# Patient Record
Sex: Male | Born: 1990 | Race: White | Hispanic: No | Marital: Single | Smoking: Never smoker
Health system: Southern US, Community
[De-identification: ages and names within clinical notes are randomized; demographics above are authoritative.]

## PROBLEM LIST (undated history)

## (undated) DIAGNOSIS — F329 Major depressive disorder, single episode, unspecified: Secondary | ICD-10-CM

## (undated) DIAGNOSIS — E785 Hyperlipidemia, unspecified: Secondary | ICD-10-CM

## (undated) DIAGNOSIS — G2581 Restless legs syndrome: Secondary | ICD-10-CM

## (undated) DIAGNOSIS — E669 Obesity, unspecified: Secondary | ICD-10-CM

## (undated) DIAGNOSIS — T39311A Poisoning by propionic acid derivatives, accidental (unintentional), initial encounter: Secondary | ICD-10-CM

## (undated) DIAGNOSIS — E039 Hypothyroidism, unspecified: Secondary | ICD-10-CM

## (undated) DIAGNOSIS — L83 Acanthosis nigricans: Secondary | ICD-10-CM

## (undated) HISTORY — DX: Major depressive disorder, single episode, unspecified: F32.9

## (undated) HISTORY — DX: Hyperlipidemia, unspecified: E78.5

## (undated) HISTORY — DX: Poisoning by propionic acid derivatives, accidental (unintentional), initial encounter: T39.311A

## (undated) HISTORY — DX: Acanthosis nigricans: L83

## (undated) HISTORY — DX: Restless legs syndrome: G25.81

## (undated) HISTORY — DX: Obesity, unspecified: E66.9

---
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
## 2013-07-02 DIAGNOSIS — L83 Acanthosis nigricans: Secondary | ICD-10-CM | POA: Insufficient documentation

## 2013-08-11 DIAGNOSIS — G2581 Restless legs syndrome: Secondary | ICD-10-CM | POA: Insufficient documentation

## 2013-08-12 DIAGNOSIS — E785 Hyperlipidemia, unspecified: Secondary | ICD-10-CM | POA: Insufficient documentation

## 2013-12-31 DIAGNOSIS — F329 Major depressive disorder, single episode, unspecified: Secondary | ICD-10-CM | POA: Insufficient documentation

## 2013-12-31 DIAGNOSIS — F32A Depression, unspecified: Secondary | ICD-10-CM | POA: Insufficient documentation

## 2014-01-09 ENCOUNTER — Inpatient Hospital Stay: Payer: Self-pay | Admitting: Psychiatry

## 2014-01-09 LAB — CBC WITH DIFFERENTIAL/PLATELET
BASOS ABS: 0.2 10*3/uL — AB (ref 0.0–0.1)
Basophil %: 2.2 %
EOS PCT: 5.8 %
Eosinophil #: 0.5 10*3/uL (ref 0.0–0.7)
HCT: 38 % — ABNORMAL LOW (ref 40.0–52.0)
HGB: 12.8 g/dL — ABNORMAL LOW (ref 13.0–18.0)
Lymphocyte #: 2.2 10*3/uL (ref 1.0–3.6)
Lymphocyte %: 24.6 %
MCH: 31.3 pg (ref 26.0–34.0)
MCHC: 33.6 g/dL (ref 32.0–36.0)
MCV: 93 fL (ref 80–100)
MONOS PCT: 6.5 %
Monocyte #: 0.6 x10 3/mm (ref 0.2–1.0)
NEUTROS ABS: 5.4 10*3/uL (ref 1.4–6.5)
NEUTROS PCT: 60.9 %
Platelet: 236 10*3/uL (ref 150–440)
RBC: 4.07 10*6/uL — ABNORMAL LOW (ref 4.40–5.90)
RDW: 15.2 % — AB (ref 11.5–14.5)
WBC: 8.8 10*3/uL (ref 3.8–10.6)

## 2014-01-09 LAB — COMPREHENSIVE METABOLIC PANEL
ALBUMIN: 4.4 g/dL (ref 3.4–5.0)
ALT: 58 U/L
Alkaline Phosphatase: 62 U/L
Anion Gap: 7 (ref 7–16)
BUN: 15 mg/dL (ref 7–18)
Bilirubin,Total: 0.6 mg/dL (ref 0.2–1.0)
CO2: 28 mmol/L (ref 21–32)
Calcium, Total: 9.1 mg/dL (ref 8.5–10.1)
Chloride: 106 mmol/L (ref 98–107)
Creatinine: 1.34 mg/dL — ABNORMAL HIGH (ref 0.60–1.30)
EGFR (African American): >60
Glucose: 74 mg/dL (ref 65–99)
Osmolality: 281 (ref 275–301)
Potassium: 4.1 mmol/L (ref 3.5–5.1)
SGOT(AST): 45 U/L — ABNORMAL HIGH (ref 15–37)
SODIUM: 141 mmol/L (ref 136–145)
TOTAL PROTEIN: 8.2 g/dL (ref 6.4–8.2)

## 2014-01-09 LAB — TSH: Thyroid Stimulating Horm: >100 u[IU]/mL — ABNORMAL HIGH

## 2014-01-10 LAB — DRUG SCREEN, URINE
Amphetamines, Ur Screen: NEGATIVE (ref ?–1000)
Barbiturates, Ur Screen: NEGATIVE (ref ?–200)
Benzodiazepine, Ur Scrn: NEGATIVE (ref ?–200)
CANNABINOID 50 NG, UR ~~LOC~~: NEGATIVE (ref ?–50)
Cocaine Metabolite,Ur ~~LOC~~: NEGATIVE (ref ?–300)
MDMA (Ecstasy)Ur Screen: POSITIVE (ref ?–500)
METHADONE, UR SCREEN: NEGATIVE (ref ?–300)
Opiate, Ur Screen: NEGATIVE (ref ?–300)
PHENCYCLIDINE (PCP) UR S: NEGATIVE (ref ?–25)
Tricyclic, Ur Screen: NEGATIVE (ref ?–1000)

## 2014-01-10 LAB — HEMOGLOBIN A1C: HEMOGLOBIN A1C: 5.4 % (ref 4.2–6.3)

## 2014-01-10 LAB — URINALYSIS, COMPLETE
BLOOD: NEGATIVE
Bacteria: NONE SEEN
Bilirubin,UR: NEGATIVE
GLUCOSE, UR: NEGATIVE mg/dL (ref 0–75)
KETONE: NEGATIVE
Leukocyte Esterase: NEGATIVE
NITRITE: NEGATIVE
PH: 6 (ref 4.5–8.0)
Protein: NEGATIVE
RBC,UR: <1 /HPF (ref 0–5)
SPECIFIC GRAVITY: 1.021 (ref 1.003–1.030)
Squamous Epithelial: <1

## 2014-01-10 LAB — LIPID PANEL
CHOLESTEROL: 225 mg/dL — AB (ref 0–200)
HDL: 44 mg/dL (ref 40–60)
LDL CHOLESTEROL, CALC: 151 mg/dL — AB (ref 0–100)
TRIGLYCERIDES: 152 mg/dL (ref 0–200)
VLDL Cholesterol, Calc: 30 mg/dL (ref 5–40)

## 2014-01-11 LAB — T4, FREE: FREE THYROXINE: 0.26 ng/dL — AB (ref 0.76–1.46)

## 2014-04-02 DIAGNOSIS — E039 Hypothyroidism, unspecified: Secondary | ICD-10-CM

## 2014-04-29 ENCOUNTER — Ambulatory Visit
Admit: 2014-04-29 | Disposition: A | Discharge status: Home / Self Care | Payer: Self-pay | Attending: Family Medicine | Admitting: Family Medicine

## 2014-05-23 NOTE — H&P (Signed)
PATIENT NAME:  Joe Haley, Joe Haley MR#:  [id number] DATE OF BIRTH:  Sep 27, 1990  DATE OF ADMISSION:  01/09/2014  INITIAL PSYCHIATRY ASSESSMENT   IDENTIFYING INFORMATION: The patient is a 24 year old single Caucasian male from [place], [place], is currently works at [employer].  CHIEF COMPLAINT: "I've been really down."  HISTORY OF PRESENT ILLNESS: This patient was referred for direct admission by [facility]. They have reported that the patient presented there for a crisis evaluation and was reporting having suicidal ideations with thoughts of overdosing. The patient was interviewed today. He reports being depressed for about 2 years; however, over the last 3 weeks, the depression has significantly worsened. He describes his stressors, worries with his weight, as he is overweight. He also is unhappy with his work. He worked as a Tax adviser at [employer] and has been having trouble with his supervisors. The patient feels that he can't keep up with the job and is stressful to him. The patient also states that he lives at home with his parents and his 2 siblings. His sister, who is 67 years old, has a child who is 17 years old, and the patient states that he is upset because his parents are functioning as the main caregivers of the 62-year-old and there is a lot of fighting between them and the sister.  In terms of his symptomatology, the patient complains of depressed mood, poor energy, poor concentration. He denies issues with appetite or sleep. He does feel unmotivated to do things. He does not have many hobbies, has not been doing many things for pleasure. He states that he is usually too shy and is self-conscious about his appearance and the way he talks to people. He only has friends that he has met online whom he plays video games with. Most of his friends are from [place]. He has never actually met them. The patient denies any homicidality or having auditory or visual hallucinations.  In  terms of substance abuse, he denies abusing any alcohol or illicit substances.  In terms of trauma, he denies any history of traumatic events in his life.   PAST PSYCHIATRIC HISTORY: The patient denies any prior psychiatric history. Never been hospitalized. Does not have any history of suicidal attempts or self-injurious behaviors. He has been seeing his primary care provider, Eulogio Bear, from [place], who has been prescribing him with bupropion 150 mg p.o. He has been taking this medication for about 2 weeks and he feels is helping some.   PAST MEDICAL HISTORY: Noncontributory for any medical problems. No history of seizures or head trauma.   FAMILY HISTORY: Noncontributory. The patient denies any family history of mental illness, suicide, drug addiction or alcohol use.   SOCIAL HISTORY: The patient has a twelfth grade education. He completed a semester at the [employer] and states he did well. He, however, did not continue, as he does not have the financial means to pay for the following semester. He has been working at [employer] and is trying to save money in order to return in the spring. The patient wants hopes to become a radiology technician as his father. 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 The patient is single. He has never been married. Does not have any children. He denies any history of legal problems. He lives with his parents and his  2 siblings, brother who is 70, a sister who is 54, and her sister's son who is 64 years old. The patient denies any history of academic difficulties in school. It appears that he has a history of being an introvert with some issues with socialization.   ALLERGIES: NO KNOWN DRUG ALLERGIES.  REVIEW OF SYSTEMS: The patient denies any nausea, vomiting, or diarrhea. The rest of the 10 review of systems  is negative.   MENTAL STATUS EXAMINATION: The patient is an obese 24 year old Caucasian male who appears his stated age. He displays fair grooming and hygiene. He was passive, but cooperative with the assessment. His psychomotor activity is retarded. His eye contact is limited. His speech had decreased tone, volume, and rate. Thought process is concrete. Thought content is negative for suicidality, negative for homicidality, negative for auditory or visual hallucinations. His mood is dysphoric. His affect is blunted. Insight and judgment are fair. Cognitive examination: He is alert and oriented in person, place, time, and situation. Fund of knowledge appears to be below average for his level of education.   PHYSICAL EXAMINATION: VITAL SIGNS: Blood pressure is 123/79, respirations 20, pulse 79, temperature 98.9.  MUSCULOSKELETAL: The patient has a normal gait, normal muscular tone. No evidence of involuntary movements.   LABORATORY RESULTS: The patient has normal limits comprehensive metabolic panel. He only shows mild elevation of AST at 45. He has mildly decreased hemoglobin and hematocrit 12.8 and 38.0. Urine is pending.   DIAGNOSIS: Major depressive disorder, single episode, severe. Rule out social phobia. Obesity.   ASSESSMENT AND PLAN: The patient is a 24 year old admitted due to worsening depression and suicidality. The mental status examination is consistent with major depressive disorder of significant severity. The patient was tearful. Had poor eye contact. Had difficulties sustaining his attention. I do agreed that the patient, at this point in time, will benefit from inpatient psychiatric hospitalization in order to stabilize him.   PLAN: The patient, for depression, will be continued on Wellbutrin XL; however, I will increase the dose to 300 mg p.o. daily. This medication was chosen, as it is an activating agent and has low propensity for increasing weight. For insomnia, the patient will be  started on Ambien.  LABORATORY: I will order a B12, a TSH, hemoglobin A1c, and lipid panel. As this patient has significant obesity, metabolic syndrome needs to be ruled out. Also the TSH and B12 will be checked in order to rule out organic cause for his depressive symptomatology.   DISCHARGE DISPOSITION: Once this patient is stable, he will return back to his parent's house in [place]. Social worker will try to schedule with a psychiatrist in the [place] system, as he has [employer] and has already a primary care provider in that area. If not, the patient could also be referred to come here to [facility].    ____________________________ Hildred Priest, MD ahg:sw D: 01/09/2014 17:12:59 ET T: 01/09/2014 17:30:20 ET JOB#: [id number]  cc: Hildred Priest, MD, <Dictator> Rhodia Albright MD ELECTRONICALLY SIGNED 01/13/2014 21:43

## 2014-05-27 NOTE — H&P (Signed)
PATIENT NAME:  Joe Haley, Joe Haley MR#:  [id number] DATE OF BIRTH:  1990/03/23  DATE OF CONSULTATION:  01/10/2014  PRIMARY CARE PROVIDER: Courtney ParisElizabeth B. White, FNP  CHIEF COMPLAINT: In the hospital for depression. Consult requested for elevated TSH.   HISTORY OF PRESENT ILLNESS: This is a 24 year old man who is admitted to the psychiatric unit for depression on 01/09/2014. On routine testing, he was found to have a TSH of a greater than 100. The patient complains of feeling fatigued for the past 3 weeks to a month. He has been depressed and that depression has been going on for a while, and has been in the past also. No complaints of constipation or weight changes. The patient does not offer any further physical complaints. Hospitalist services were contacted for further evaluation.   PAST MEDICAL HISTORY: Depression.   PAST SURGICAL HISTORY: None.   ALLERGIES: No known drug allergies.   HOME MEDICATIONS: Prior to coming into the hospital, included: Wellbutrin 150 mg daily extended-release. Here in the hospital he is taking: Tylenol as needed for pain, Mylanta as needed for heartburn, Wellbutrin was increased to 300 mg extended-release daily, Ambien 5 mg as needed for sleep.   SOCIAL HISTORY: No smoking. No alcohol. No drug use. 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   FAMILY HISTORY: Parents are healthy, without thyroid issues. Mother has a foot issue. No thyroid issues in the family.   REVIEW OF SYSTEMS:  CONSTITUTIONAL: Positive for fatigue. No fever, chills, or sweats. No weight loss. No weight gain.  EYES: No visual problems.  EARS, NOSE, MOUTH AND THROAT: No sore throat. No difficulty swallowing. No hearing problems.  CARDIOVASCULAR: No chest pain. No palpitations.  RESPIRATORY: No shortness of breath. Occasional cough with clear to green phlegm. No hemoptysis.  GASTROINTESTINAL: No nausea. No vomiting. No abdominal pain. No diarrhea. No constipation. Occasional blood in the bowel  movements with hemorrhoids.  GENITOURINARY: No burning on urination or hematuria.  MUSCULOSKELETAL: No joint pain or muscle pain.  INTEGUMENT: No rashes or eruptions.  NEUROLOGIC: No fainting or blackouts.  PSYCHIATRIC: Positive for depression.  ENDOCRINE: No prior thyroid history.  HEMATOLOGIC AND LYMPHATIC: No anemia. No easy bruising or bleeding.   PHYSICAL EXAMINATION:  VITAL SIGNS: Temperature 97.9, pulse 63, respirations 20, blood pressure 121/80, pulse oximetry 97% on room air.  GENERAL: Obese male in no respiratory distress.  EYES: Conjunctivae and lids normal. Pupils equal, round, and reactive to light. Extraocular muscles intact. No nystagmus. Slight exophthalmos. EARS, NOSE, MOUTH AND THROAT: Nasal mucosa: No erythema.  Throat: No erythema, no exudate seen. Lips and Gums: No lesions.  NECK: No JVD. No bruits. No lymphadenopathy. No thyromegaly. No thyroid nodules palpated.  RESPIRATORY: Lungs clear to auscultation. No use of accessory muscles to breathe. No rhonchi, rales, or wheeze heard.  CARDIOVASCULAR: S1 and S2 normal. No gallops, rubs, or murmurs heard. Carotid upstroke 2+ bilaterally. No bruits.  EXTREMITIES: Dorsalis pedis pulses 2+ bilaterally. Trace edema of over the shins.  ABDOMEN: Soft, nontender. No organomegaly/splenomegaly. Normoactive bowel sounds. No masses felt.  LYMPHATIC: No lymph nodes in the neck.  MUSCULOSKELETAL: Trace edema over the shins. No clubbing. No cyanosis.  SKIN: Acanthosis nigrans seen over the neck posteriorly, and bilateral sides of the neck. No other lesions seen.  NEUROLOGICAL: Cranial nerves II through XII grossly intact. Deep tendon reflexes 2+ bilateral lower extremities.  PSYCHIATRIC: The patient is oriented to person, place, and time.   LABORATORY AND RADIOLOGICAL DATA: TSH greater than 100. Glucose  74, BUN 15, creatinine 1.34, sodium 141, potassium 4.1, chloride 106, CO2 of 28, calcium 9.1. Liver function tests: AST slightly elevated  at 45. White blood cell count 8.8, H and H 12.8 and 38.0, platelet count 236,000. Hemoglobin A1c 5.4. LDL 151, HDL 44, triglycerides 152, total cholesterol 225. Urine toxicology positive for MDMA. Urinalysis is negative.   ASSESSMENT AND PLAN:  1.  Hypothyroidism. No family history of hypothyroidism. TSH greater than 100. I will check free T4, free T3, thyroid peroxidase antibody and thyroglobulin. I will start levothyroxine 137 mcg p.o. daily and can potentially start up to 1.6 mcg/kg daily. I recommend checking TFTs in 6 weeks if much improved, but can be done as early as 3 weeks if still symptomatic.  2.  Depression. This could be secondary to hypothyroidism.  3.  Morbid obesity with a body mass index of 42.3; weight loss is needed.  4.  Acanthosis nigrans seen on the neck. This is likely secondary to metabolic syndrome with obesity, high triglycerides, increased waist circumference, increased LDL. Weight loss is definitely needed for this.   TIME SPENT ON CONSULTATION: 50 minutes.    ____________________________ Herschell Dimes. Renae Gloss, MD rjw:MT D: 01/10/2014 14:43:17 ET T: 01/10/2014 15:21:44 ET JOB#: [id number]  cc: Herschell Dimes. Renae Gloss, MD, <Dictator> Courtney Paris. White, FNP Salley Scarlet MD ELECTRONICALLY SIGNED 01/11/2014 13:19

## 2014-05-31 NOTE — Consult Note (Signed)
PATIENT NAME:  Joe Haley, Joe Haley MR#:  [id number] DATE OF BIRTH:  08/20/1990  DATE OF CONSULTATION:  01/10/2014  CONSULTING PHYSICIAN:  Herschell Dimes. Renae Gloss, MD  PRIMARY CARE PROVIDER: Courtney Paris. White, FNP  CHIEF COMPLAINT: In the hospital for depression. Consult requested for elevated TSH.   HISTORY OF PRESENT ILLNESS: This is a 24 year old man who is admitted to the psychiatric unit for depression on 01/09/2014. On routine testing, he was found to have a TSH of a greater than 100. The patient complains of feeling fatigued for the past 3 weeks to a month. He has been depressed and that depression has been going on for a while, and has been in the past also. No complaints of constipation or weight changes. The patient does not offer any further physical complaints. Hospitalist services were contacted for further evaluation.   PAST MEDICAL HISTORY: Depression.   PAST SURGICAL HISTORY: None.   ALLERGIES: No known drug allergies.   HOME MEDICATIONS: Prior to coming into the hospital, included: Wellbutrin 150 mg daily extended-release. Here in the hospital he is taking: Tylenol as needed for pain, Mylanta as needed for heartburn, Wellbutrin was increased to 300 mg extended-release daily, Ambien 5 mg as needed for sleep.   SOCIAL HISTORY: No smoking. No alcohol. No drug use. 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   FAMILY HISTORY: Parents are healthy, without thyroid issues. Mother has a foot issue. No thyroid issues in the family.   REVIEW OF SYSTEMS:  CONSTITUTIONAL: Positive for fatigue. No fever, chills, or sweats. No weight loss. No weight gain.  EYES: No visual problems.  EARS, NOSE, MOUTH AND THROAT: No sore throat. No difficulty swallowing. No hearing problems.  CARDIOVASCULAR: No chest pain. No palpitations.  RESPIRATORY: No shortness of breath. Occasional cough with clear to green phlegm. No hemoptysis.  GASTROINTESTINAL: No nausea. No vomiting. No abdominal pain. No diarrhea. No  constipation. Occasional blood in the bowel movements with hemorrhoids.  GENITOURINARY: No burning on urination or hematuria.  MUSCULOSKELETAL: No joint pain or muscle pain.  INTEGUMENT: No rashes or eruptions.  NEUROLOGIC: No fainting or blackouts.  PSYCHIATRIC: Positive for depression.  ENDOCRINE: No prior thyroid history.  HEMATOLOGIC AND LYMPHATIC: No anemia. No easy bruising or bleeding.   PHYSICAL EXAMINATION:  VITAL SIGNS: Temperature 97.9, pulse 63, respirations 20, blood pressure 121/80, pulse oximetry 97% on room air.  GENERAL: Obese male in no respiratory distress.  EYES: Conjunctivae and lids normal. Pupils equal, round, and reactive to light. Extraocular muscles intact. No nystagmus. Slight exophthalmos. EARS, NOSE, MOUTH AND THROAT: Nasal mucosa: No erythema.  Throat: No erythema, no exudate seen. Lips and Gums: No lesions.  NECK: No JVD. No bruits. No lymphadenopathy. No thyromegaly. No thyroid nodules palpated.  RESPIRATORY: Lungs clear to auscultation. No use of accessory muscles to breathe. No rhonchi, rales, or wheeze heard.  CARDIOVASCULAR: S1 and S2 normal. No gallops, rubs, or murmurs heard. Carotid upstroke 2+ bilaterally. No bruits.  EXTREMITIES: Dorsalis pedis pulses 2+ bilaterally. Trace edema of over the shins.  ABDOMEN: Soft, nontender. No organomegaly/splenomegaly. Normoactive bowel sounds. No masses felt.  LYMPHATIC: No lymph nodes in the neck.  MUSCULOSKELETAL: Trace edema over the shins. No clubbing. No cyanosis.  SKIN: Acanthosis nigrans seen over the neck posteriorly, and bilateral sides of the neck. No other lesions seen.  NEUROLOGICAL: Cranial nerves II through XII grossly intact. Deep tendon reflexes 2+ bilateral lower extremities.  PSYCHIATRIC: The patient is oriented to person, place, and time.   LABORATORY  AND RADIOLOGICAL DATA: TSH greater than 100. Glucose 74, BUN 15, creatinine 1.34, sodium 141, potassium 4.1, chloride 106, CO2 of 28, calcium 9.1.  Liver function tests: AST slightly elevated at 45. White blood cell count 8.8, H and H 12.8 and 38.0, platelet count 236,000. Hemoglobin A1c 5.4. LDL 151, HDL 44, triglycerides 152, total cholesterol 225. Urine toxicology positive for MDMA. Urinalysis is negative.   ASSESSMENT AND PLAN:  1.  Hypothyroidism. No family history of hypothyroidism. TSH greater than 100. I will check free T4, free T3, thyroid peroxidase antibody and thyroglobulin. I will start levothyroxine 137 mcg p.o. daily and can potentially start up to 1.6 mcg/kg daily. I recommend checking TFTs in 6 weeks if much improved, but can be done as early as 3 weeks if still symptomatic.  2.  Depression. This could be secondary to hypothyroidism.  3.  Morbid obesity with a body mass index of 42.3; weight loss is needed.  4.  Acanthosis nigrans seen on the neck. This is likely secondary to metabolic syndrome with obesity, high triglycerides, increased waist circumference, increased LDL. Weight loss is definitely needed for this.   TIME SPENT ON CONSULTATION: 50 minutes.    ____________________________ Herschell Dimesichard J. Renae GlossWieting, MD rjw:MT D: 01/10/2014 14:43:17 ET T: 01/10/2014 15:21:44 ET JOB#: [id number]  cc: Herschell Dimesichard J. Renae GlossWieting, MD, <Dictator> Courtney ParisElizabeth B. White, FNP Salley ScarletICHARD J Daevon Holdren MD ELECTRONICALLY SIGNED 01/11/2014 13:19

## 2014-06-24 DIAGNOSIS — F411 Generalized anxiety disorder: Secondary | ICD-10-CM | POA: Insufficient documentation

## 2014-06-24 DIAGNOSIS — F331 Major depressive disorder, recurrent, moderate: Secondary | ICD-10-CM | POA: Insufficient documentation

## 2016-03-27 ENCOUNTER — Emergency Department
Admission: EM | Admit: 2016-03-27 | Discharge: 2016-03-28 | Disposition: A | Discharge status: Other Acute Inpatient Hospital | Payer: Managed Care, Other (non HMO) | Attending: Emergency Medicine | Admitting: Emergency Medicine

## 2016-03-27 DIAGNOSIS — E237 Disorder of pituitary gland, unspecified: Secondary | ICD-10-CM

## 2016-03-27 DIAGNOSIS — F3130 Bipolar disorder, current episode depressed, mild or moderate severity, unspecified: Secondary | ICD-10-CM | POA: Diagnosis not present

## 2016-03-27 DIAGNOSIS — R410 Disorientation, unspecified: Secondary | ICD-10-CM | POA: Insufficient documentation

## 2016-03-27 DIAGNOSIS — Z79899 Other long term (current) drug therapy: Secondary | ICD-10-CM | POA: Insufficient documentation

## 2016-03-27 DIAGNOSIS — T39311A Poisoning by propionic acid derivatives, accidental (unintentional), initial encounter: Secondary | ICD-10-CM

## 2016-03-27 DIAGNOSIS — E039 Hypothyroidism, unspecified: Secondary | ICD-10-CM

## 2016-03-27 DIAGNOSIS — T1491XA Suicide attempt, initial encounter: Secondary | ICD-10-CM

## 2016-03-27 DIAGNOSIS — F332 Major depressive disorder, recurrent severe without psychotic features: Secondary | ICD-10-CM

## 2016-03-27 DIAGNOSIS — T39312A Poisoning by propionic acid derivatives, intentional self-harm, initial encounter: Secondary | ICD-10-CM | POA: Diagnosis present

## 2016-03-27 LAB — ETHANOL

## 2016-03-27 LAB — URINALYSIS, COMPLETE (UACMP) WITH MICROSCOPIC
BILIRUBIN URINE: NEGATIVE
GLUCOSE, UA: NEGATIVE mg/dL
Hgb urine dipstick: NEGATIVE
Ketones, ur: NEGATIVE mg/dL
LEUKOCYTES UA: NEGATIVE
NITRITE: NEGATIVE
PH: 5 (ref 5.0–8.0)
Protein, ur: 30 mg/dL — AB
Specific Gravity, Urine: 1.016 (ref 1.005–1.030)

## 2016-03-27 LAB — URINE DRUG SCREEN, QUALITATIVE (ARMC ONLY)
AMPHETAMINES, UR SCREEN: NOT DETECTED
BENZODIAZEPINE, UR SCRN: NOT DETECTED
Barbiturates, Ur Screen: NOT DETECTED
Cannabinoid 50 Ng, Ur ~~LOC~~: NOT DETECTED
Cocaine Metabolite,Ur ~~LOC~~: NOT DETECTED
MDMA (ECSTASY) UR SCREEN: NOT DETECTED
METHADONE SCREEN, URINE: NOT DETECTED
Opiate, Ur Screen: NOT DETECTED
PHENCYCLIDINE (PCP) UR S: NOT DETECTED
Tricyclic, Ur Screen: NOT DETECTED

## 2016-03-27 LAB — COMPREHENSIVE METABOLIC PANEL
ALBUMIN: 4.7 g/dL (ref 3.5–5.0)
ALK PHOS: 49 U/L (ref 38–126)
ALT: 22 U/L (ref 17–63)
ALT: 19 U/L (ref 17–63)
ANION GAP: 12 (ref 5–15)
AST: 23 U/L (ref 15–41)
AST: 24 U/L (ref 15–41)
Albumin: 4 g/dL (ref 3.5–5.0)
Alkaline Phosphatase: 60 U/L (ref 38–126)
Anion gap: 11 (ref 5–15)
BILIRUBIN TOTAL: 0.5 mg/dL (ref 0.3–1.2)
BUN: 13 mg/dL (ref 6–20)
BUN: 12 mg/dL (ref 6–20)
CALCIUM: 8 mg/dL — AB (ref 8.9–10.3)
CHLORIDE: 107 mmol/L (ref 101–111)
CHLORIDE: 111 mmol/L (ref 101–111)
CO2: 22 mmol/L (ref 22–32)
CO2: 19 mmol/L — ABNORMAL LOW (ref 22–32)
CREATININE: 0.85 mg/dL (ref 0.61–1.24)
Calcium: 8.6 mg/dL — ABNORMAL LOW (ref 8.9–10.3)
Creatinine, Ser: 1.03 mg/dL (ref 0.61–1.24)
GFR calc Af Amer: >60 mL/min (ref >60)
GFR calc non Af Amer: >60 mL/min (ref >60)
GLUCOSE: 83 mg/dL (ref 65–99)
Glucose, Bld: 79 mg/dL (ref 65–99)
Potassium: 4.2 mmol/L (ref 3.5–5.1)
Potassium: 3.9 mmol/L (ref 3.5–5.1)
Sodium: 141 mmol/L (ref 135–145)
Sodium: 141 mmol/L (ref 135–145)
Total Bilirubin: 0.6 mg/dL (ref 0.3–1.2)
Total Protein: 7.6 g/dL (ref 6.5–8.1)
Total Protein: 6.5 g/dL (ref 6.5–8.1)

## 2016-03-27 LAB — CBC
HEMATOCRIT: 47.2 % (ref 40.0–52.0)
Hemoglobin: 16.1 g/dL (ref 13.0–18.0)
MCH: 30.1 pg (ref 26.0–34.0)
MCHC: 34.2 g/dL (ref 32.0–36.0)
MCV: 88 fL (ref 80.0–100.0)
PLATELETS: 262 10*3/uL (ref 150–440)
RBC: 5.36 MIL/uL (ref 4.40–5.90)
RDW: 14.7 % — ABNORMAL HIGH (ref 11.5–14.5)
WBC: 13.8 10*3/uL — AB (ref 3.8–10.6)

## 2016-03-27 LAB — LACTIC ACID, PLASMA: LACTIC ACID, VENOUS: 1.3 mmol/L (ref 0.5–1.9)

## 2016-03-27 LAB — ACETAMINOPHEN LEVEL

## 2016-03-27 LAB — SALICYLATE LEVEL: Salicylate Lvl: <7 mg/dL (ref 2.8–30.0)

## 2016-03-27 LAB — LIPASE, BLOOD: LIPASE: 28 U/L (ref 11–51)

## 2016-03-27 LAB — TROPONIN I: Troponin I: <0.03 ng/mL (ref <0.03)

## 2016-03-27 MED ORDER — SODIUM CHLORIDE 0.9 % IV SOLN
Freq: Once | INTRAVENOUS | Status: AC
  Administered 2016-03-27: 16:00:00 via INTRAVENOUS

## 2016-03-27 MED ORDER — ONDANSETRON HCL 4 MG/2ML IJ SOLN
4.0000 mg | Freq: Once | INTRAMUSCULAR | Status: AC
  Administered 2016-03-27: 4 mg via INTRAVENOUS
  Filled 2016-03-27: qty 2

## 2016-03-27 NOTE — ED Notes (Signed)
EDP at bedside  

## 2016-03-27 NOTE — ED Notes (Signed)
TTS at bedside. 

## 2016-03-27 NOTE — ED Notes (Signed)

## 2016-03-27 NOTE — ED Notes (Signed)
Pt resting in bed, resp even and unlabored, pt eating dinner tray

## 2016-03-27 NOTE — ED Notes (Signed)
Elnita MaxwellCheryl from [facility] called asking about patient status.  She was given current vitals and told that his recent CMP was WNL.

## 2016-03-27 NOTE — ED Notes (Signed)
Patient's father called for an update on this patient, he was updated with patient consent.  He will call back tomorrow for another update.

## 2016-03-27 NOTE — ED Notes (Signed)
Hourly rounding reveals patient sleeping in room. No complaints, stable, in no acute distress. Q15 minute rounds and monitoring via Security Cam[employer] to continue. 

## 2016-03-27 NOTE — ED Notes (Signed)
Dressed pt out , clothing placed in belonging bag, shoes, sweat pants, t-shirt, fleece jacket.     I phone given to pts father per pt request

## 2016-03-27 NOTE — ED Provider Notes (Signed)
[facility] Emergency Department Provider Note   ____________________________________________   First MD Initiated Contact with Patient 03/27/16 1548     (approximate)  I have reviewed the triage vital signs and the nursing notes.   HISTORY  Chief Complaint Suicide Attempt      HPI Joe Haley is a 26 y.o. male she tells me that at 3:00 this morning he took a big bottle of ibuprofen 200 mg tablets possibly 500 pills in the bottle. This was again at 3:00 in the morning this morning patient now feels a little bit nauseated but is otherwise okay patient is still depressed he wants it altered and he rather the dad he thinks he better off that way. He denies any other complaints is nothing else is hurting her bothering him.  History reviewed. No pertinent past medical history.  There are no active problems to display for this patient.   History reviewed. No pertinent surgical history.  Prior to Admission medications   Medication Sig Start Date End Date Taking? Authorizing Provider  levothyroxine (SYNTHROID, LEVOTHROID) 150 MCG tablet Take 150 mcg by mouth daily. 03/03/16  Yes Historical Provider, MD    Allergies Patient has no known allergies.  No family history on file.  Social History Social History  Substance Use Topics  . Smoking status: Never Smoker  . Smokeless tobacco: Never Used  . Alcohol use No    Review of Systems Constitutional: No fever/chills Eyes: No visual changes. ENT: No sore throat. Cardiovascular: Denies chest pain. Respiratory: Denies shortness of breath. Gastrointestinal: No abdominal pain.  No nausea, no vomiting.  No diarrhea.  No constipation. Genitourinary: Negative for dysuria. Musculoskeletal: Negative for back pain. Skin: Negative for rash.  10-point ROS otherwise negative.  ____________________________________________   PHYSICAL EXAM:  VITAL SIGNS: ED Triage Vitals  Enc Vitals Group     BP  03/27/16 1525 138/81     Pulse Rate 03/27/16 1525 87     Resp 03/27/16 1525 18     Temp 03/27/16 1525 97.9 F (36.6 C)     Temp Source 03/27/16 1525 Oral     SpO2 03/27/16 1525 98 %     Weight 03/27/16 1525 280 lb (127 kg)     Height 03/27/16 1525 5\' 11"  (1.803 m)     Head Circumference --      Peak Flow --      Pain Score 03/27/16 1526 5     Pain Loc --      Pain Edu? --      Excl. in GC? --     Constitutional: Alert and oriented. Well appearing and in no acute distress. Eyes: Conjunctivae are normal. PERRL. EOMI. Head: Atraumatic. Nose: No congestion/rhinnorhea. Mouth/Throat: Mucous membranes are moist.  Oropharynx non-erythematous. Neck: No stridor.  Cardiovascular: Normal rate, regular rhythm. Grossly normal heart sounds.  Good peripheral circulation. Respiratory: Normal respiratory effort.  No retractions. Lungs CTAB. Gastrointestinal: Soft and nontender. No distention. No abdominal bruits. No CVA tenderness.Patient does complain of nausea Musculoskeletal: No lower extremity tenderness nor edema.  No joint effusions. Skin:  Skin is warm, dry and intact. No rash noted. Psychiatric: She appears to be depressed  ____________________________________________   LABS (all labs ordered are listed, but only abnormal results are displayed)  Labs Reviewed  COMPREHENSIVE METABOLIC PANEL - Abnormal; Notable for the following:       Result Value   Calcium 8.6 (*)    All other components within normal limits  ACETAMINOPHEN  LEVEL - Abnormal; Notable for the following:    Acetaminophen (Tylenol), Serum <10 (*)    All other components within normal limits  CBC - Abnormal; Notable for the following:    WBC 13.8 (*)    RDW 14.7 (*)    All other components within normal limits  URINALYSIS, COMPLETE (UACMP) WITH MICROSCOPIC - Abnormal; Notable for the following:    Color, Urine STRAW (*)    APPearance CLEAR (*)    Protein, ur 30 (*)    Bacteria, UA RARE (*)    Squamous Epithelial /  LPF 0-5 (*)    All other components within normal limits  COMPREHENSIVE METABOLIC PANEL - Abnormal; Notable for the following:    CO2 19 (*)    Calcium 8.0 (*)    All other components within normal limits  ETHANOL  SALICYLATE LEVEL  URINE DRUG SCREEN, QUALITATIVE ([facility] ONLY)  LIPASE, BLOOD  LACTIC ACID, PLASMA  TROPONIN I   ____________________________________________  EKG EKG read and interpreted by me shows normal sinus rhythm rate of 72 normal axis flipped T waves anteriorly in V2 through V4. No old EKGs available for comparison  ____________________________________________  RADIOLOGY   ____________________________________________   PROCEDURES  Procedure(s) performed:   Procedures  Critical Care performed:  ____________________________________________   INITIAL IMPRESSION / ASSESSMENT AND PLAN / ED COURSE  Pertinent labs & imaging results that were available during my care of the patient were reviewed by me and considered in my medical decision making (see chart for details).        ____________________________________________   FINAL CLINICAL IMPRESSION(S) / ED DIAGNOSES  Final diagnoses:  Suicide attempt      NEW MEDICATIONS STARTED DURING THIS VISIT:  New Prescriptions   No medications on file     Note:  This document was prepared using Dragon voice recognition software and may include unintentional dictation errors.    Arnaldo NatalPaul F Adreena Willits, MD 03/27/16 2137

## 2016-03-27 NOTE — ED Notes (Signed)
Pt. Transferred to [facility] from ED to room after screening for contraband. Report to include Situation, Background, Assessment and [facility]. Pt. Oriented to unit including Q15 minute rounds as well as the security cameras for their protection. Patient is alert and oriented, warm and dry in no acute distress. Patient denies HI, and AVH. Pt. States he still has SI without a plan at this point. Pt. Encouraged to let me know if needs arise.

## 2016-03-27 NOTE — ED Notes (Signed)
Dr. Darnelle CatalanMalinda contacted Poison Control

## 2016-03-27 NOTE — ED Triage Notes (Addendum)
Pt states he took about 20 IBU this morning around 3am wanting to commit suicide, "states I just want it to end".. Pt father is here with him, when asked why the pt attempted Suicide,pt states "I dont want any help". Father presents a letter from the pt expressing SI..Marland Kitchen

## 2016-03-27 NOTE — BH Assessment (Signed)
Assessment Note  Joe Haley is an 26 y.o. male. Joe Haley arrived to the ED by way of personal transportation from his father.  He reports that he took "about 200 pills" of ibuprophen.  He states, "I don't want to live anymore".  He reports that he was diagnosed with depression about 4 years ago. He shared that he feels depressed all the time.  He is not currently taking his medication and reports that he has not seen his psychiatrist in some time.  He reports that he is easily made anxious.  He reports that "I just don't like people". Joe Haley denied the use of alcohol or drugs.  He denied having auditory or visual hallucinations. He denied having homicidal ideation or intent. Joe Haley continues to endorse suicidal ideation.  He reports that he will overdose or run in front of a car.  Diagnosis: Depression, SI  Past Medical History: History reviewed. No pertinent past medical history.  History reviewed. No pertinent surgical history.  Family History: No family history on file.  Social History:  reports that he has never smoked. He has never used smokeless tobacco. He reports that he does not drink alcohol or use drugs.  Additional Social History:  Alcohol / Drug Use History of alcohol / drug use?: No history of alcohol / drug abuse  CIWA: CIWA-Ar BP: 111/60 Pulse Rate: (!) 58 COWS:    Allergies: No Known Allergies  Home Medications:  (Not in a hospital admission)  OB/GYN Status:  No LMP for male patient.  General Assessment Data Location of Assessment: [facility] ED TTS Assessment: In system Is this a Tele or Face-to-Face Assessment?: Face-to-Face Is this an Initial Assessment or a Re-assessment for this encounter?: Initial Assessment Marital status: Single Maiden name: n/a Is patient pregnant?: No Pregnancy Status: No Living Arrangements: Parent Can pt return to current living arrangement?: Yes Admission Status: Involuntary Is patient capable of signing voluntary admission?:  Yes Referral Source: Self/Family/Friend Insurance type: Product/process development scientist Exam [facility] Walk-in ONLY) Medical Exam completed: Yes  Crisis Care Plan Living Arrangements: Parent Legal Guardian: Other: (Self) Name of Psychiatrist: at [facility] Name of Therapist: Rosemarie Beath  Education Status Is patient currently in school?: No Current Grade: n/a Highest grade of school patient has completed: 12 Name of school: [employer] operational [employer] person: n/a  Risk to self with the past 6 months Suicidal Ideation: Yes-Currently Present Has patient been a risk to self within the past 6 months prior to admission? : Yes Suicidal Intent: Yes-Currently Present Has patient had any suicidal intent within the past 6 months prior to admission? : Yes Is patient at risk for suicide?: Yes Suicidal Plan?: Yes-Currently Present Has patient had any suicidal plan within the past 6 months prior to admission? : Yes Specify Current Suicidal Plan: Overdose, run in traffic Access to Means: No (Currently in the hospital) What has been your use of drugs/alcohol within the last 12 months?: Denied use Previous Attempts/Gestures: Yes How many times?: 1 Other Self Harm Risks: denied Triggers for Past Attempts: Unknown Intentional Self Injurious Behavior: None Family Suicide History: No Recent stressful life event(s): Other (Comment) (Denied stressors) Persecutory voices/beliefs?: No Depression: Yes Depression Symptoms: Despondent, Feeling worthless/self pity Substance abuse history and/or treatment for substance abuse?: No Suicide prevention information given to non-admitted patients: Not applicable  Risk to Others within the past 6 months Homicidal Ideation: No Does patient have any lifetime risk of violence toward others beyond the six months prior to admission? : No Thoughts  of Harm to Others: No Current Homicidal Intent: No Current Homicidal Plan: No Access to Homicidal Means: No Identified Victim:  None identified History of harm to others?: No Assessment of Violence: None Noted Violent Behavior Description: denied Does patient have access to weapons?: No Criminal Charges Pending?: No Does patient have a court date: No Is patient on probation?: No  Psychosis Hallucinations: None noted Delusions: None noted  Mental Status Report Appearance/Hygiene: In scrubs, Unremarkable Eye Contact: Fair Motor Activity: Unremarkable Speech: Soft Level of Consciousness: Quiet/awake Mood: Depressed Affect: Appropriate to circumstance Anxiety Level: None Thought Processes: Coherent Judgement: Unimpaired Orientation: Person, Place, Time, Situation Obsessive Compulsive Thoughts/Behaviors: None  Cognitive Functioning Concentration: Normal Memory: Recent Intact IQ: Average Insight: Fair Impulse Control: Fair Appetite: Good Sleep: No Change Vegetative Symptoms: None  ADLScreening [facility] Assessment Services) Patient's cognitive ability adequate to safely complete daily activities?: Yes Patient able to express need for assistance with ADLs?: Yes Independently performs ADLs?: Yes (appropriate for developmental age)  Prior Inpatient Therapy Prior Inpatient Therapy: Yes Prior Therapy Dates: 2016 [facility] Reason for Treatment: Depression  Prior Outpatient Therapy Prior Outpatient Therapy: Yes Prior Therapy Dates: Current Prior Therapy Facilty/Provider(s): [facility] Reason for Treatment: Dpression Does patient have an ACCT team?: No Does patient have Intensive In-House Services?  : No Does patient have [facility] services? : No Does patient have P4CC services?: No  ADL Screening (condition at time of admission) Patient's cognitive ability adequate to safely complete daily activities?: Yes Patient able to express need for assistance with ADLs?: Yes Independently performs ADLs?: Yes (appropriate for developmental age)       Abuse/Neglect Assessment  (Assessment to be complete while patient is alone) Physical Abuse: Denies Verbal Abuse: Denies Sexual Abuse: Denies Exploitation of patient/patient's resources: Denies Self-Neglect: Denies     Merchant navy officerAdvance Directives (For Healthcare) Does Patient Have a Medical Advance Directive?: No Would patient like information on creating a medical advance directive?: No - Patient declined    Additional Information 1:1 In Past 12 Months?: No CIRT Risk: No Elopement Risk: No Does patient have medical clearance?: Yes     Disposition:  Disposition Initial Assessment Completed for this Encounter: Yes Disposition of Patient: Other dispositions  On Site Evaluation by:   Reviewed with Physician:    Justice DeedsKeisha Dana Debo 03/27/2016 10:20 PM

## 2016-03-28 ENCOUNTER — Inpatient Hospital Stay
Admit: 2016-03-28 | Discharge: 2016-04-05 | DRG: 885 | Disposition: A | Discharge status: Home / Self Care | Payer: Managed Care, Other (non HMO) | Attending: Psychiatry | Admitting: Psychiatry

## 2016-03-28 ENCOUNTER — Emergency Department: Payer: Managed Care, Other (non HMO)

## 2016-03-28 DIAGNOSIS — T1491XA Suicide attempt, initial encounter: Secondary | ICD-10-CM | POA: Diagnosis not present

## 2016-03-28 DIAGNOSIS — T39312A Poisoning by propionic acid derivatives, intentional self-harm, initial encounter: Secondary | ICD-10-CM | POA: Diagnosis not present

## 2016-03-28 DIAGNOSIS — F332 Major depressive disorder, recurrent severe without psychotic features: Secondary | ICD-10-CM

## 2016-03-28 DIAGNOSIS — G4700 Insomnia, unspecified: Secondary | ICD-10-CM | POA: Diagnosis present

## 2016-03-28 DIAGNOSIS — T39311A Poisoning by propionic acid derivatives, accidental (unintentional), initial encounter: Secondary | ICD-10-CM

## 2016-03-28 DIAGNOSIS — F3130 Bipolar disorder, current episode depressed, mild or moderate severity, unspecified: Secondary | ICD-10-CM

## 2016-03-28 DIAGNOSIS — E237 Disorder of pituitary gland, unspecified: Secondary | ICD-10-CM

## 2016-03-28 DIAGNOSIS — E039 Hypothyroidism, unspecified: Secondary | ICD-10-CM | POA: Diagnosis present

## 2016-03-28 DIAGNOSIS — T39312D Poisoning by propionic acid derivatives, intentional self-harm, subsequent encounter: Secondary | ICD-10-CM | POA: Diagnosis not present

## 2016-03-28 DIAGNOSIS — E221 Hyperprolactinemia: Secondary | ICD-10-CM | POA: Diagnosis present

## 2016-03-28 DIAGNOSIS — G4710 Hypersomnia, unspecified: Secondary | ICD-10-CM | POA: Diagnosis present

## 2016-03-28 DIAGNOSIS — Z79899 Other long term (current) drug therapy: Secondary | ICD-10-CM | POA: Diagnosis not present

## 2016-03-28 DIAGNOSIS — Z811 Family history of alcohol abuse and dependence: Secondary | ICD-10-CM | POA: Diagnosis not present

## 2016-03-28 HISTORY — DX: Hypothyroidism, unspecified: E03.9

## 2016-03-28 MED ORDER — TRAZODONE HCL 100 MG PO TABS
100.0000 mg | ORAL_TABLET | Freq: Every evening | ORAL | Status: DC | PRN
  Administered 2016-03-28 – 2016-03-29 (×2): 100 mg via ORAL
  Filled 2016-03-28 (×2): qty 1

## 2016-03-28 MED ORDER — MAGNESIUM HYDROXIDE 400 MG/5ML PO SUSP: 30.0000 mL | Freq: Every day | ORAL | Status: DC | PRN

## 2016-03-28 MED ORDER — ALUM & MAG HYDROXIDE-SIMETH 200-200-20 MG/5ML PO SUSP: 30.0000 mL | ORAL | Status: DC | PRN

## 2016-03-28 MED ORDER — BUPROPION HCL ER (XL) 300 MG PO TB24
300.0000 mg | ORAL_TABLET | Freq: Every day | ORAL | Status: DC
  Administered 2016-03-28: 300 mg via ORAL
  Filled 2016-03-28: qty 1

## 2016-03-28 MED ORDER — LORAZEPAM 2 MG PO TABS
2.0000 mg | ORAL_TABLET | Freq: Four times a day (QID) | ORAL | Status: DC | PRN
  Administered 2016-03-28 – 2016-03-30 (×2): 2 mg via ORAL
  Filled 2016-03-28 (×2): qty 1

## 2016-03-28 MED ORDER — LEVOTHYROXINE SODIUM 150 MCG PO TABS
150.0000 ug | ORAL_TABLET | Freq: Every day | ORAL | Status: DC
  Administered 2016-03-29 – 2016-04-05 (×8): 150 ug via ORAL
  Filled 2016-03-28 (×8): qty 1

## 2016-03-28 MED ORDER — LEVOTHYROXINE SODIUM 150 MCG PO TABS
150.0000 ug | ORAL_TABLET | Freq: Every day | ORAL | Status: DC
  Administered 2016-03-28: 150 ug via ORAL
  Filled 2016-03-28: qty 1

## 2016-03-28 MED ORDER — BUPROPION HCL ER (XL) 150 MG PO TB24
300.0000 mg | ORAL_TABLET | Freq: Every day | ORAL | Status: DC
  Administered 2016-03-29 – 2016-04-05 (×8): 300 mg via ORAL
  Filled 2016-03-28 (×8): qty 2

## 2016-03-28 MED ORDER — ACETAMINOPHEN 325 MG PO TABS: 650.0000 mg | ORAL_TABLET | Freq: Four times a day (QID) | ORAL | Status: DC | PRN

## 2016-03-28 NOTE — ED Notes (Signed)
Hourly rounding reveals patient sleeping in room. No complaints, stable, in no acute distress. Q15 minute rounds and monitoring via Security Cam[employer] to continue. 

## 2016-03-28 NOTE — ED Notes (Signed)
Pt presents with a sad affect and guarded behavior.  Pt currently denies SI/HI and AVH.Marland Kitchen. Pt reports having overdosed on the ibuprofen because of "these people in my life." Pt wouldn't elaborate on who "these people" are other than that he's known them a long time. 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 He came to this conclusion after reading up on it online. Pt supported emotionally and encouraged to express concerns and ask questions. Pt remains safe with 15 minute checks.

## 2016-03-28 NOTE — ED Notes (Signed)
Hourly rounding reveals patient in room. No complaints, stable, in no acute distress. Q15 minute rounds and monitoring via Security Cameras to continue. 

## 2016-03-28 NOTE — ED Notes (Signed)
Patient requests shower. Supplied pt with new wine colored scrubs, towels, and toiletries.

## 2016-03-28 NOTE — ED Notes (Signed)
Patient given snack- pb and crackers  

## 2016-03-28 NOTE — Tx Team (Signed)
Initial Treatment Plan 03/28/2016 11:57 PM Renne Lyn HenriMark Lauter ZOX:[id number]    PATIENT STRESSORS: Financial difficulties Loss of relationship Occupational concerns   PATIENT STRENGTHS: Communication skills Supportive family/friends   PATIENT IDENTIFIED PROBLEMS:                      DISCHARGE CRITERIA:  Ability to meet basic life and health needs Improved stabilization in mood, thinking, and/or behavior Verbal commitment to aftercare and medication compliance  PRELIMINARY DISCHARGE PLAN: Outpatient therapy Return to previous living arrangement  PATIENT/FAMILY INVOLVEMENT: This treatment plan has been presented to and reviewed with the patient, Anastasia PallZackary Mark Hush.  The patient and family have been given the opportunity to ask questions and make suggestions.  Milon ScoreValinda Brigido Mera, RN 03/28/2016, 11:57 PM

## 2016-03-28 NOTE — ED Provider Notes (Addendum)
-----------------------------------------   11:30 AM on 03/28/2016 -----------------------------------------  Dr. Toni Amendlapacs spoke with me in person about this patient after evaluating him and told me that he plans to admit the patient to behavioral medicine.    ----------------------------------------- 9:03 AM on 03/28/2016 -----------------------------------------  [facility], I ordered the patient's home dose of Synthroid.   ----------------------------------------- 7:24 AM on 03/28/2016 -----------------------------------------   Blood pressure 107/80, pulse 69, temperature 98.7 F (37.1 C), temperature source Oral, resp. rate 18, height 5\' 11"  (1.803 m), weight 127 kg, SpO2 100 %.  The patient had no acute events since last update.  Calm and cooperative at this time.  Disposition is pending Psychiatry/Behavioral Medicine team recommendations.     Joe Roseory Shamir Tuzzolino, MD 03/28/16 [id number]    Joe Roseory Anahis Furgeson, MD 03/28/16 [id number]    Joe Roseory Darey Hershberger, MD 03/28/16 1130

## 2016-03-28 NOTE — ED Notes (Signed)
Hourly rounding reveals patient sitting in room. No complaints, stable, in no acute distress. Q15 minute rounds and monitoring via Security Cameras to continue. 

## 2016-03-28 NOTE — ED Notes (Signed)
Lunch tray brought to patient 

## 2016-03-28 NOTE — Progress Notes (Signed)
Patient received on the unit in scrubs. Affect flat.  When asked about thoughts of SI states "not at this moment"  Speech is quiet.  Reluctant to answer questions. Body search and skin assessment performed.  No contraband found.  Skin warm and dry to touch.  No contraband found.

## 2016-03-28 NOTE — ED Notes (Signed)
Pt. C/o gastric upset. Ginger ale given.

## 2016-03-28 NOTE — ED Notes (Signed)
Pt offered shower, but refuses

## 2016-03-28 NOTE — Consult Note (Signed)
[facility] Consult   Reason for Consult:  Consult for 26 year old man presents to the hospital after a suicide attempt Referring Physician:  Karma Greaser Patient Identification: Joe Haley MRN:  [id number] Principal Diagnosis: Bipolar depression [facility]) Diagnosis:   Patient Active Problem List   Diagnosis Date Noted  . Severe recurrent major depression without psychotic features ([place]) [F33.2] 03/28/2016  . Bipolar depression ([place]) [F31.30] 03/28/2016  . Hypothyroidism [E03.9] 03/28/2016  . Ibuprofen overdose [T39.311A] 03/28/2016    Total Time spent with patient: 1 hour  Subjective:   Joe Haley is a 26 y.o. male patient admitted with "I took a bunch of pills".  HPI:  Patient seen chart reviewed. Labs reviewed. Case reviewed with emergency room physician. 26 year old man presented to the emergency room after trying to kill himself by taking about 200 ibuprofen. He says that this happened at about 3 in the morning on Monday. He did not tell anyone at the time but left a suicide note for his family. They found the note in the morning and of course he was still alive at the time and they insisted he come to the hospital. Patient had been planning this at least forced a brief period of time. Says that he really wanted to die. Mood has been extremely sad and down for many months. Energy level is very low. Hopelessness. Poor sleep at night. Frequent suicidal ideation. Not currently having any psychotic symptoms. Patient is not taking any psychiatric medicine. He had previously been taking Wellbutrin but stopped it about a year and a half ago in part because he wanted to join the [employer]. About 5 months after stopping his Wellbutrin and his depression started up again. Patient does very little during the day. On further history he does state that he has occasionally had very brief periods of hypomania which last perhaps one day at a time. In the past he had had a phase of having  psychotic symptoms but nothing like that recently.  Substance abuse: Does not drink alcohol or use any recreational drugs.  Social history: Living at home with his parents. Not working. Does almost nothing during the day. He used to have thoughts about joining the [employer] but hasn't done anything about it recently.  Medical history: Patient has hypothyroidism discovered some time ago which seems to be quite severe actually. Apparently he required 150 to 175 g of thyroid replacement a day. He admits to me that he is not nearly as compliant with that as he should be. No other known medical problems.  Past Psychiatric History: Patient has had 1 previous hospitalization here a couple years ago. Denied having any past suicide attempts. Can't remember any other medicines he has been on in the past. As noted above he reports possible hypomanic episodes intermittently. Was referred for outpatient psychiatric treatment and was compliant for a little while but then stopped his medicine.  Risk to Self: Suicidal Ideation: Yes-Currently Present Suicidal Intent: Yes-Currently Present Is patient at risk for suicide?: Yes Suicidal Plan?: Yes-Currently Present Specify Current Suicidal Plan: Overdose, run in traffic Access to Means: No (Currently in the hospital) What has been your use of drugs/alcohol within the last 12 months?: Denied use How many times?: 1 Other Self Harm Risks: denied Triggers for Past Attempts: Unknown Intentional Self Injurious Behavior: None Risk to Others: Homicidal Ideation: No Thoughts of Harm to Others: No Current Homicidal Intent: No Current Homicidal Plan: No Access to Homicidal Means: No Identified Victim: None identified History of  harm to others?: No Assessment of Violence: None Noted Violent Behavior Description: denied Does patient have access to weapons?: No Criminal Charges Pending?: No Does patient have a court date: No Prior Inpatient Therapy: Prior Inpatient  Therapy: Yes Prior Therapy Dates: 2016 Prior Therapy Facilty/Provider(s): [facility] Reason for Treatment: Depression Prior Outpatient Therapy: Prior Outpatient Therapy: Yes Prior Therapy Dates: Current Prior Therapy Facilty/Provider(s): [facility] Reason for Treatment: Dpression Does patient have an ACCT team?: No Does patient have Intensive In-House Services?  : No Does patient have [facility] services? : No Does patient have P4CC services?: No  Past Medical History: History reviewed. No pertinent past medical history. History reviewed. No pertinent surgical history. Family History: No family history on file. Family Psychiatric  History: Does not know of any family history of mental illness Social History:  History  Alcohol Use No     History  Drug Use No    Social History   Social History  . Marital status: Single    Spouse name: N/A  . Number of children: N/A  . Years of education: N/A   Social History Main Topics  . Smoking status: Never Smoker  . Smokeless tobacco: Never Used  . Alcohol use No  . Drug use: No  . Sexual activity: Not Asked   Other Topics Concern  . None   Social History Narrative  . None   Additional Social History:    Allergies:  No Known Allergies  Labs:  Results for orders placed or performed during the hospital encounter of 03/27/16 (from the past 48 hour(s))  Comprehensive metabolic panel     Status: Abnormal   Collection Time: 03/27/16  3:32 PM  Result Value Ref Range   Sodium 141 135 - 145 mmol/L   Potassium 4.2 3.5 - 5.1 mmol/L   Chloride 107 101 - 111 mmol/L   CO2 22 22 - 32 mmol/L   Glucose, Bld 83 65 - 99 mg/dL   BUN 13 6 - 20 mg/dL   Creatinine, Ser 1.03 0.61 - 1.24 mg/dL   Calcium 8.6 (L) 8.9 - 10.3 mg/dL   Total Protein 7.6 6.5 - 8.1 g/dL   Albumin 4.7 3.5 - 5.0 g/dL   AST 23 15 - 41 U/L   ALT 22 17 - 63 U/L   Alkaline Phosphatase 60 38 - 126 U/L   Total Bilirubin 0.6 0.3 - 1.2 mg/dL   GFR calc non Af Amer >60 >60 mL/min   GFR  calc Af Amer >60 >60 mL/min    Comment: (NOTE) The eGFR has been calculated using the CKD EPI equation. This calculation has not been validated in all clinical situations. eGFR's persistently <60 mL/min signify possible Chronic Kidney Disease.    Anion gap 12 5 - 15  Ethanol     Status: None   Collection Time: 03/27/16  3:32 PM  Result Value Ref Range   Alcohol, Ethyl (B) <5 <5 mg/dL    Comment:        LOWEST DETECTABLE LIMIT FOR SERUM ALCOHOL IS 5 mg/dL FOR MEDICAL PURPOSES ONLY   Salicylate level     Status: None   Collection Time: 03/27/16  3:32 PM  Result Value Ref Range   Salicylate Lvl <8.8 2.8 - 30.0 mg/dL  Acetaminophen level     Status: Abnormal   Collection Time: 03/27/16  3:32 PM  Result Value Ref Range   Acetaminophen (Tylenol), Serum <10 (L) 10 - 30 ug/mL    Comment:  THERAPEUTIC CONCENTRATIONS VARY SIGNIFICANTLY. A RANGE OF 10-30 ug/mL MAY BE AN EFFECTIVE CONCENTRATION FOR MANY PATIENTS. HOWEVER, SOME ARE BEST TREATED AT CONCENTRATIONS OUTSIDE THIS RANGE. ACETAMINOPHEN CONCENTRATIONS >150 ug/mL AT 4 HOURS AFTER INGESTION AND >50 ug/mL AT 12 HOURS AFTER INGESTION ARE OFTEN ASSOCIATED WITH TOXIC REACTIONS.   cbc     Status: Abnormal   Collection Time: 03/27/16  3:32 PM  Result Value Ref Range   WBC 13.8 (H) 3.8 - 10.6 K/uL   RBC 5.36 4.40 - 5.90 MIL/uL   Hemoglobin 16.1 13.0 - 18.0 g/dL   HCT 47.2 40.0 - 52.0 %   MCV 88.0 80.0 - 100.0 fL   MCH 30.1 26.0 - 34.0 pg   MCHC 34.2 32.0 - 36.0 g/dL   RDW 14.7 (H) 11.5 - 14.5 %   Platelets 262 150 - 440 K/uL  Urine Drug Screen, Qualitative     Status: None   Collection Time: 03/27/16  3:32 PM  Result Value Ref Range   Tricyclic, Ur Screen NONE DETECTED NONE DETECTED   Amphetamines, Ur Screen NONE DETECTED NONE DETECTED   MDMA (Ecstasy)Ur Screen NONE DETECTED NONE DETECTED   Cocaine Metabolite,Ur [place] NONE DETECTED NONE DETECTED   Opiate, Ur Screen NONE DETECTED NONE DETECTED   Phencyclidine (PCP) Ur  S NONE DETECTED NONE DETECTED   Cannabinoid 50 Ng, Ur [place] NONE DETECTED NONE DETECTED   Barbiturates, Ur Screen NONE DETECTED NONE DETECTED   Benzodiazepine, Ur Scrn NONE DETECTED NONE DETECTED   Methadone Scn, Ur NONE DETECTED NONE DETECTED    Comment: (NOTE) 657  Tricyclics, urine               Cutoff 1000 ng/mL 200  Amphetamines, urine             Cutoff 1000 ng/mL 300  MDMA (Ecstasy), urine           Cutoff 500 ng/mL 400  Cocaine Metabolite, urine       Cutoff 300 ng/mL 500  Opiate, urine                   Cutoff 300 ng/mL 600  Phencyclidine (PCP), urine      Cutoff 25 ng/mL 700  Cannabinoid, urine              Cutoff 50 ng/mL 800  Barbiturates, urine             Cutoff 200 ng/mL 900  Benzodiazepine, urine           Cutoff 200 ng/mL 1000 Methadone, urine                Cutoff 300 ng/mL 1100 1200 The urine drug screen provides only a preliminary, unconfirmed 1300 analytical test result and should not be used for non-medical 1400 purposes. Clinical consideration and professional judgment should 1500 be applied to any positive drug screen result due to possible 1600 interfering substances. A more specific alternate chemical method 1700 must be used in order to obtain a confirmed analytical result.  1800 Gas chromato graphy / mass spectrometry (GC/MS) is the preferred 1900 confirmatory method.   Urinalysis, Complete w Microscopic     Status: Abnormal   Collection Time: 03/27/16  3:32 PM  Result Value Ref Range   Color, Urine STRAW (A) YELLOW   APPearance CLEAR (A) CLEAR   Specific Gravity, Urine 1.016 1.005 - 1.030   pH 5.0 5.0 - 8.0   Glucose, UA NEGATIVE NEGATIVE mg/dL   Hgb urine dipstick  NEGATIVE NEGATIVE   Bilirubin Urine NEGATIVE NEGATIVE   Ketones, ur NEGATIVE NEGATIVE mg/dL   Protein, ur 30 (A) NEGATIVE mg/dL   Nitrite NEGATIVE NEGATIVE   Leukocytes, UA NEGATIVE NEGATIVE   RBC / HPF 0-5 0 - 5 RBC/hpf   WBC, UA 6-30 0 - 5 WBC/hpf   Bacteria, UA RARE (A) NONE SEEN    Squamous Epithelial / LPF 0-5 (A) NONE SEEN   Mucous PRESENT   Lipase, blood     Status: None   Collection Time: 03/27/16  4:17 PM  Result Value Ref Range   Lipase 28 11 - 51 U/L  Lactic acid, plasma     Status: None   Collection Time: 03/27/16  4:17 PM  Result Value Ref Range   Lactic Acid, Venous 1.3 0.5 - 1.9 mmol/L  Troponin I     Status: None   Collection Time: 03/27/16  4:17 PM  Result Value Ref Range   Troponin I <0.03 <0.03 ng/mL  Comprehensive metabolic panel     Status: Abnormal   Collection Time: 03/27/16  6:22 PM  Result Value Ref Range   Sodium 141 135 - 145 mmol/L   Potassium 3.9 3.5 - 5.1 mmol/L   Chloride 111 101 - 111 mmol/L   CO2 19 (L) 22 - 32 mmol/L   Glucose, Bld 79 65 - 99 mg/dL   BUN 12 6 - 20 mg/dL   Creatinine, Ser 0.85 0.61 - 1.24 mg/dL   Calcium 8.0 (L) 8.9 - 10.3 mg/dL   Total Protein 6.5 6.5 - 8.1 g/dL   Albumin 4.0 3.5 - 5.0 g/dL   AST 24 15 - 41 U/L   ALT 19 17 - 63 U/L   Alkaline Phosphatase 49 38 - 126 U/L   Total Bilirubin 0.5 0.3 - 1.2 mg/dL   GFR calc non Af Amer >60 >60 mL/min   GFR calc Af Amer >60 >60 mL/min    Comment: (NOTE) The eGFR has been calculated using the CKD EPI equation. This calculation has not been validated in all clinical situations. eGFR's persistently <60 mL/min signify possible Chronic Kidney Disease.    Anion gap 11 5 - 15    Current Facility-Administered Medications  Medication Dose Route Frequency Provider Last Rate Last Dose  . buPROPion (WELLBUTRIN XL) 24 hr tablet 300 mg  300 mg Oral Daily Gonzella Lex, MD   300 mg at 03/28/16 1323  . levothyroxine (SYNTHROID, LEVOTHROID) tablet 150 mcg  150 mcg Oral Daily Hinda Kehr, MD   150 mcg at 03/28/16 6387   Current Outpatient Prescriptions  Medication Sig Dispense Refill  . levothyroxine (SYNTHROID, LEVOTHROID) 150 MCG tablet Take 150 mcg by mouth daily.      Musculoskeletal: Strength & Muscle Tone: within normal limits Gait & Station: normal Patient  leans: N/A  Psychiatric Specialty Exam: Physical Exam  Nursing note and vitals reviewed. Constitutional: He appears well-developed and well-nourished.  HENT:  Head: Normocephalic and atraumatic.  Eyes: Conjunctivae are normal. Pupils are equal, round, and reactive to light.  Neck: Normal range of motion.  Cardiovascular: Regular rhythm and normal heart sounds.   Respiratory: Effort normal. No respiratory distress.  GI: Soft.  Musculoskeletal: Normal range of motion.  Neurological: He is alert.  Skin: Skin is warm and dry.  Psychiatric: His mood appears anxious. His affect is blunt. His speech is delayed. He is slowed and withdrawn. He expresses impulsivity. He exhibits a depressed mood. He expresses suicidal ideation. He exhibits abnormal recent memory.  Review of Systems  Constitutional: Negative.   HENT: Negative.   Eyes: Negative.   Respiratory: Negative.   Cardiovascular: Negative.   Gastrointestinal: Positive for abdominal pain.  Musculoskeletal: Negative.   Skin: Negative.   Neurological: Negative.   Psychiatric/Behavioral: Positive for depression and suicidal ideas. Negative for hallucinations, memory loss and substance abuse. The patient is nervous/anxious and has insomnia.     Blood pressure 116/70, pulse 81, temperature 97.7 F (36.5 C), temperature source Oral, resp. rate 18, height 5' 11" (1.803 m), weight 127 kg (280 lb), SpO2 100 %.Body mass index is 39.05 kg/m.  General Appearance: Disheveled  Eye Contact:  Minimal  Speech:  Slow  Volume:  Decreased  Mood:  Depressed  Affect:  Congruent  Thought Process:  Goal Directed  Orientation:  Full (Time, Place, and Person)  Thought Content:  Logical  Suicidal Thoughts:  Yes.  with intent/plan  Homicidal Thoughts:  No  Memory:  Immediate;   Good Recent;   Good Remote;   Fair  Judgement:  Impaired  Insight:  Fair  Psychomotor Activity:  Decreased  Concentration:  Concentration: Fair  Recall:  [employer] of  Knowledge:  Fair  Language:  Fair  Akathisia:  No  Handed:  Right  AIMS (if indicated):     Assets:  Communication Skills Desire for Improvement Housing Social Support  ADL's:  Intact  Cognition:  Impaired,  Mild  Sleep:        Treatment Plan Summary: Daily contact with patient to assess and evaluate symptoms and progress in treatment, Medication management and Plan 26 year old man with severe depression and possibly bipolar or bipolar 2 depression. Serious suicide attempt with suicidal intentions still very depressed. Patient requires admission to the hospital. Continue IVC. Admission orders in place. Restart Wellbutrin 300 mg a day. Continue or restart Synthroid 150 g per day. To my gross observation the patient has a cushingoid appearance about him. It makes me wonder if he has more extensive endocrine problems. I have put in an order to check his cortisol level. We'll also put in an order to at least get an initial CT scan. Treatment team downstairs can work this up at their discretion. TSH is currently pending. Full set of labs to be done. 15 minute checks in place.  Disposition: Recommend psychiatric Inpatient admission when medically cleared. Supportive therapy provided about ongoing stressors.  Alethia Berthold, MD 03/28/2016 3:16 PM

## 2016-03-28 NOTE — ED Notes (Signed)
Report to oncoming staff. 

## 2016-03-28 NOTE — ED Notes (Signed)
Hourly rounding reveals patient in day room. No complaints, stable, in no acute distress. Q15 minute rounds and monitoring via Security Cameras to continue. 

## 2016-03-28 NOTE — ED Notes (Signed)
Brought breakfast tray to pt. Patient reports having slept okay.

## 2016-03-28 NOTE — ED Notes (Signed)
Pt IVC'd transferred to BMU, lower level accompanied by police and staff with papers and all of belongings

## 2016-03-29 ENCOUNTER — Encounter: Payer: Self-pay | Admitting: Psychiatry

## 2016-03-29 DIAGNOSIS — F332 Major depressive disorder, recurrent severe without psychotic features: Principal | ICD-10-CM

## 2016-03-29 LAB — LIPID PANEL
CHOL/HDL RATIO: 5.7 ratio
CHOLESTEROL: 183 mg/dL (ref 0–200)
HDL: 32 mg/dL — ABNORMAL LOW (ref >40)
LDL CALC: 115 mg/dL — AB (ref 0–99)
Triglycerides: 180 mg/dL — ABNORMAL HIGH (ref <150)
VLDL: 36 mg/dL (ref 0–40)

## 2016-03-29 LAB — VITAMIN B12: VITAMIN B 12: 344 pg/mL (ref 180–914)

## 2016-03-29 LAB — CORTISOL: Cortisol, Plasma: 23.7 ug/dL

## 2016-03-29 NOTE — [facility] Group Notes (Signed)
[facility] Group Notes:  (Nursing/MHT/Case Management/Adjunct)  Date:  03/29/2016  Time:  5:08 PM  Type of Therapy:  Psychoeducational Skills  Participation Level:  Active  Participation Quality:  Appropriate, Attentive and Supportive  Affect:  Appropriate  Cognitive:  Appropriate  Insight:  Improving  Engagement in Group:  Engaged and Supportive  Modes of Intervention:  Discussion and Education  Summary of Progress/Problems:  Joe Haley 03/29/2016, 5:08 PM

## 2016-03-29 NOTE — [facility] Group Notes (Signed)
[facility] LCSW Group Therapy  03/29/2016 4:17 PM  Called out of am group by CSW and did not return  Joe Haley Krienke 03/29/2016, 4:17 PM

## 2016-03-29 NOTE — Progress Notes (Signed)
Recreation Therapy Notes  Date: 02.28.18 Time: 1:00 pm Location: Craft Room  Group Topic: Self-esteem  Goal Area(s) Addresses:  Patient will be able to identify benefit of having healthy self-esteem. Patient will be able to identify ways to increase self-esteem.  Behavioral Response: Attentive, Interactive  Intervention: Self-Portrait  Activity: Patients were given blank face worksheets and were instructed to draw their self-portrait of how they were feeling. LRT collected drawings. Patients were given construction paper and were instructed to write a positive trait about self. Patient passed the papers around the room and they wrote positive traits about their peers. Patients were instructed to draw their self-portraits again based on how they felt after reading the comments from peers.  Education: LRT educated patients on ways they can increase their self-esteem.  Education Outcome: Acknowledges education/In group clarification offered  Clinical Observations/Feedback: Patient drew both self-portraits and wrote a positive trait about himself and peers. Patient did not contribute to group discussion.  Jacquelynn CreeGreene,Quetzalli Clos M, LRT/CTRS 03/29/2016 2:11 PM

## 2016-03-29 NOTE — Tx Team (Signed)
Interdisciplinary Treatment and Diagnostic Plan Update  03/29/2016 Time of Session: 11:00 AM Joe Haley MRN: [id number]  Principal Diagnosis: Severe recurrent major depression without psychotic features [facility])  Secondary Diagnoses: Principal Problem:   Severe recurrent major depression without psychotic features (HCC) Active Problems:   Hypothyroidism   Ibuprofen overdose   Current Medications:  Current Facility-Administered Medications  Medication Dose Route Frequency Provider Last Rate Last Dose  . acetaminophen (TYLENOL) tablet 650 mg  650 mg Oral Q6H PRN Audery Amel, MD      . alum & mag hydroxide-simeth (MAALOX/MYLANTA) 200-200-20 MG/5ML suspension 30 mL  30 mL Oral Q4H PRN Audery Amel, MD      . buPROPion (WELLBUTRIN XL) 24 hr tablet 300 mg  300 mg Oral Daily Audery Amel, MD   300 mg at 03/29/16 1191  . levothyroxine (SYNTHROID, LEVOTHROID) tablet 150 mcg  150 mcg Oral QAC breakfast Audery Amel, MD   150 mcg at 03/29/16 4782  . LORazepam (ATIVAN) tablet 2 mg  2 mg Oral Q6H PRN Jimmy Footman, MD   2 mg at 03/28/16 2139  . magnesium hydroxide (MILK OF MAGNESIA) suspension 30 mL  30 mL Oral Daily PRN Audery Amel, MD      . traZODone (DESYREL) tablet 100 mg  100 mg Oral QHS PRN Audery Amel, MD   100 mg at 03/28/16 2140   PTA Medications: Prescriptions Prior to Admission  Medication Sig Dispense Refill Last Dose  . levothyroxine (SYNTHROID, LEVOTHROID) 150 MCG tablet Take 150 mcg by mouth daily.   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   Physician Treatment Plan for Primary Diagnosis: Severe recurrent major depression without psychotic features (HCC) Long  Term Goal(s): Improvement in symptoms so as ready for discharge Improvement in symptoms so as ready for discharge   Short Term Goals: Ability to identify changes in lifestyle to reduce recurrence of condition will improve Ability to verbalize feelings will improve Ability to disclose and discuss suicidal ideas Compliance with prescribed medications will improve Ability to demonstrate self-control will improve Compliance with prescribed medications will improve Ability to identify triggers associated with substance abuse/mental health issues will improve  Medication Management: Evaluate patient's response, side effects, and tolerance of medication regimen.  Therapeutic Interventions: 1 to 1 sessions, Unit Group sessions and Medication administration.  Evaluation of Outcomes: Progressing  Physician Treatment Plan for Secondary Diagnosis: Principal Problem:   Severe recurrent major depression without psychotic features (HCC) Active Problems:   Hypothyroidism   Ibuprofen overdose  Long Term Goal(s): Improvement in symptoms so as ready for discharge Improvement in symptoms so as ready for discharge   Short Term Goals: Ability to identify changes in lifestyle to reduce recurrence of condition will improve Ability to verbalize feelings will improve Ability to disclose and discuss suicidal ideas Compliance with prescribed medications will improve Ability to demonstrate self-control will improve Compliance with prescribed medications will improve Ability to identify triggers associated with substance abuse/mental health issues will improve     Medication Management: Evaluate patient's response, side effects, and tolerance of medication regimen.  Therapeutic Interventions: 1 to 1 sessions, Unit Group sessions and Medication administration.  Evaluation of Outcomes: Progressing   RN Treatment Plan for Primary Diagnosis: Severe recurrent major depression without psychotic features  (HCC)  Long Term Goal(s): Knowledge of disease and therapeutic regimen to maintain health will improve  Short Term Goals: Ability to remain free from injury will improve, Ability to demonstrate self-control and Ability to disclose and discuss suicidal ideas  Medication Management: RN will administer medications as ordered by provider, will assess and evaluate patient's response and provide education to patient for prescribed medication. RN will report any adverse and/or side effects to prescribing provider.  Therapeutic Interventions: 1 on 1 counseling sessions, Psychoeducation, Medication administration, Evaluate responses to treatment, Monitor vital signs and CBGs as ordered, Perform/monitor CIWA, COWS, AIMS and Fall Risk screenings as ordered, Perform wound care treatments as ordered.  Evaluation of Outcomes: Progressing   LCSW Treatment Plan for Primary Diagnosis: Severe recurrent major depression without psychotic features (HCC) Long Term Goal(s): Safe transition to appropriate next level of care at discharge, Engage patient in therapeutic group addressing interpersonal concerns.  Short Term Goals: Engage patient in aftercare planning with referrals and resources, Increase social support, Increase ability to appropriately verbalize feelings and Increase emotional regulation  Therapeutic Interventions: Assess for all discharge needs, 1 to 1 time with Social worker, Explore available resources and support systems, Assess for adequacy in community support network, Educate family and significant other(s) on suicide prevention, Complete Psychosocial Assessment, Interpersonal group therapy.  Evaluation of Outcomes: Progressing   Progress in Treatment: Attending groups: Yes. Participating in groups: Yes. Taking medication as prescribed: Yes. Toleration medication: Yes. Family/Significant other contact made: Yes, individual(s) contacted:  CSW contacted pt's parents. Patient understands  diagnosis: Yes. Discussing patient identified problems/goals with staff: Yes. Medical problems stabilized or resolved: Yes. Denies suicidal/homicidal ideation: Yes. Issues/concerns per patient self-inventory: No.  New problem(s) identified: No, Describe:  None identified.  New Short Term/Long Term Goal(s): Pt stated goal is to get back on medications and engage in social support.  Discharge Plan or Barriers: CSW assessing proper contacts.  Reason for Continuation of Hospitalization: Anxiety Depression Suicidal ideation  Estimated Length of Stay: 3-5 days   Attendees: Patient: Joe Haley 03/29/2016 1:38 PM  Physician: Dr. Radene JourneyAndrea Hernandez, MD 03/29/2016 1:38 PM  Nursing: Leonia ReaderPhyllis Cobb, BSN, RN 03/29/2016 1:38 PM  RN Care Manager: 03/29/2016 1:38 PM  Social Worker: Hampton AbbotKadijah Dakari Cregger, MSW, LCSW-A 03/29/2016 1:38 PM  Recreational Therapist: Princella IonElizabeth Greene, LRT, CTRS  03/29/2016 1:38 PM    Scribe for Treatment Team: Lynden OxfordKadijah R Kasai Beltran, LCSWA 03/29/2016 1:38 PM
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
## 2016-03-29 NOTE — Progress Notes (Signed)
Patient with depressed affect, cooperative behavior with meals, meds and plan of care. Orient to unit as patient is slightly anxious, with good effect. Denies SI/HI at this time. No VH, states he "hears knocking in the corner of his room". Meets with MD and Child psychotherapistsocial worker. One on one with patient to discuss coping skills to employ here on unit. Attends therapy group. Minimal interaction with peers. Mother and Father call unit, patient aware to call parents during phone time. Safety maintained.

## 2016-03-29 NOTE — BHH Suicide Risk Assessment (Signed)
[facility] INPATIENT:  Family/Significant Other Suicide Prevention Education  Suicide Prevention Education:  Education Completed; father, Joe Haley ph#: [phone] has been identified by the patient as the family member/significant other with whom the patient will be residing, and identified as the person(s) who will aid the patient in the event of a mental health crisis (suicidal ideations/suicide attempt).  With written consent from the patient, the family member/significant other has been provided the following suicide prevention education, prior to the and/or following the discharge of the patient.  The suicide prevention education provided includes the following:  Suicide risk factors  Suicide prevention and interventions  National Suicide Hotline telephone number  [facility] assessment telephone number  [facility] Emergency Assistance 911  [facility] and/or Residential Mobile Crisis Unit telephone number  Request made of family/significant other to:  Remove weapons (e.g., guns, rifles, knives), all items previously/currently identified as safety concern.    Remove drugs/medications (over-the-counter, prescriptions, illicit drugs), all items previously/currently identified as a safety concern.  The family member/significant other verbalizes understanding of the suicide prevention education information provided.  The family member/significant other agrees to remove the items of safety concern listed above.  Joe Haley, MSW, LCSW-A 03/29/2016, 2:54 PM

## 2016-03-29 NOTE — [facility] Suicide Risk Assessment (Signed)
[facility]   Nursing information obtained from:  Patient Demographic factors:  Male, Adolescent or young adult, Unemployed Current Mental Status:  Suicidal ideation indicated by patient, Self-harm behaviors, Self-harm thoughts, Intention to act on suicide plan Loss Factors:  Loss of significant relationship Historical Factors:  Prior suicide attempts Risk Reduction Factors:  Sense of responsibility to family  Total Time spent with patient: 1 hour Principal Problem: Severe recurrent major depression without psychotic features (HCC) Diagnosis:   Patient Active Problem List   Diagnosis Date Noted  . Severe recurrent major depression without psychotic features (HCC) [F33.2] 03/28/2016  . Hypothyroidism [E03.9] 03/28/2016  . Ibuprofen overdose [T39.311A] 03/28/2016   Subjective Data:   Continued Clinical Symptoms:  Alcohol Use Disorder Identification Test Final Score (AUDIT): 1 The "Alcohol Use Disorders Identification Test", Guidelines for Use in Primary Care, Second Edition.  World Science writerHealth Organization [facility](WHO). Score between 0-7:  no or low risk or alcohol related problems. Score between 8-15:  moderate risk of alcohol related problems. Score between 16-19:  high risk of alcohol related problems. Score 20 or above:  warrants further diagnostic evaluation for alcohol dependence and treatment.   CLINICAL FACTORS:   Depression:   Impulsivity Personality Disorders:   Cluster B Comorbid depression Previous Psychiatric Diagnoses and Treatments    Psychiatric Specialty Exam: Physical Exam  ROS  Blood pressure 123/76, pulse (!) 116, temperature 98.1 F (36.7 C), resp. rate 16, height 5\' 11"  (1.803 m), weight 124.3 kg (274 lb), SpO2 100 %.Body mass index is 38.22 kg/m.                                                    Sleep:  Number of Hours: 7.5      COGNITIVE FEATURES THAT CONTRIBUTE TO RISK:  Polarized thinking    SUICIDE  RISK:   Moderate:  Frequent suicidal ideation with limited intensity, and duration, some specificity in terms of plans, no associated intent, good self-control, limited dysphoria/symptomatology, some risk factors present, and identifiable protective factors, including available and accessible social support.  [facility]  I certify that inpatient services furnished can reasonably be expected to improve the patient's condition.   Jimmy FootmanHernandez-Gonzalez,  Nayali Talerico, MD 03/29/2016, 2:03 PM
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
## 2016-03-30 LAB — THYROID PANEL WITH TSH
Free Thyroxine Index: 0.5 — ABNORMAL LOW (ref 1.2–4.9)
T3 UPTAKE RATIO: 18 % — AB (ref 24–39)
T4, Total: 3 ug/dL — ABNORMAL LOW (ref 4.5–12.0)
TSH: 101.1 u[IU]/mL — ABNORMAL HIGH (ref 0.450–4.500)

## 2016-03-30 LAB — PROLACTIN: Prolactin: 77 ng/mL — ABNORMAL HIGH (ref 4.0–15.2)

## 2016-03-30 LAB — HEMOGLOBIN A1C
HEMOGLOBIN A1C: 4.7 % — AB (ref 4.8–5.6)
MEAN PLASMA GLUCOSE: 88 mg/dL

## 2016-03-30 MED ORDER — TRAZODONE HCL 50 MG PO TABS: 150.0000 mg | ORAL_TABLET | Freq: Every evening | ORAL | Status: DC | PRN

## 2016-03-30 MED ORDER — TRAZODONE HCL 50 MG PO TABS
150.0000 mg | ORAL_TABLET | Freq: Every day | ORAL | Status: DC
  Administered 2016-03-30 – 2016-04-02 (×4): 150 mg via ORAL
  Filled 2016-03-30 (×4): qty 1

## 2016-03-30 NOTE — Progress Notes (Signed)
[facility] MD Progress Note  03/30/2016 2:35 PM Joe Haley  MRN:  [id number] Subjective:  Patient is a 26 year old Caucasian male. He presented to our emergency department on February 26 after an intentional overdose with 20 ibuprofen tablets.  Patient left a suicidal note for his parents telling them  Thank you and goodbye.  Patient was hospitalized in our unit due to depression backing 2015. He was discharged with a diagnosis of major depressive disorder. He also was found to have hypothyroidism at that time, his TSH was above 100. He was discharged on Synthroid and Wellbutrin. The patient says that he took the Wellbutrin for about 3 months and then discontinue the medication after that. He did not consistently follow up. He says that he wants to join the [employer] but one of the requirements is for him to be off any medications for a year.  Patient was seen recently at therapist and took that he also was inconsistent with his appointments.  Patient says that he has not worked for gone to school since 2015. He is states at home most of the time. For fun he plays video games. He liked a girl but she stopped talking to him a week ago. He thinks is because he was going into the [employer].  His best friend of 7 years doesn't really tell him much about his private life which makes the patient feel that his friend doesn't trust him.  Patient says he's been depressed, has been thinking about dying, has been is sleeping a lot. His appetite concentration and energy have been poor. He feels hopeless and helpless.  He is frustrated and upset that his suicidal attempt was not successful. He wishes he would have died.  3/1 patient tells me that part of him is glad that he is alive but another part of him wishes he had died. He continues to still feel very depressed. Appears hopeless. Says that he feels nobody ever is going to love him and that none of his friends really trust him.  He is very sad about not being  able to join the [employer] because he will have to take antidepressants.  Per nursing patient is in, cooperative and has been attending groups  Principal Problem: Severe recurrent major depression without psychotic features [facility]) Diagnosis:   Patient Active Problem List   Diagnosis Date Noted  . Severe recurrent major depression without psychotic features (HCC) [F33.2] 03/28/2016  . Hypothyroidism [E03.9] 03/28/2016  . Ibuprofen overdose [T39.311A] 03/28/2016   Total Time spent with patient: 30 minutes  Past Psychiatric History: No prior suicidal attempts. Has had episodes of cutting but this is not often. He has some superficial scars on his arms. One prior hospitalization here in our unit in 2015 for depression  Past Medical History:  Past Medical History:  Diagnosis Date  . Hypothyroidism    History reviewed. No pertinent surgical history.  Family History: History reviewed. No pertinent family history.  Family Psychiatric  History: Patient reports having a brother with alcoholism  Social History: He lives with his parents. He is unemployed and does not go to school. He is single, never married doesn't have any children. He graduated from high school. He denies having any major academic opiate care problems school. 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 His hope was going to the [employer] next year. Denies any legal history History  Alcohol Use No     History  Drug Use No  Social History   Social History  . Marital status: Single    Spouse name: N/A  . Number of children: N/A  . Years of education: N/A   Social History Main Topics  . Smoking status: Never Smoker  . Smokeless tobacco: Never Used  . Alcohol use No  . Drug use: No  . Sexual activity: Not Currently   Other Topics Concern  . None   Social History Narrative  . None    Current Medications: Current Facility-Administered Medications  Medication Dose Route Frequency  Provider Last Rate Last Dose  . acetaminophen (TYLENOL) tablet 650 mg  650 mg Oral Q6H PRN Audery AmelJohn T Clapacs, MD      . alum & mag hydroxide-simeth (MAALOX/MYLANTA) 200-200-20 MG/5ML suspension 30 mL  30 mL Oral Q4H PRN Audery AmelJohn T Clapacs, MD      . buPROPion (WELLBUTRIN XL) 24 hr tablet 300 mg  300 mg Oral Daily Audery AmelJohn T Clapacs, MD   300 mg at 03/30/16 0842  . levothyroxine (SYNTHROID, LEVOTHROID) tablet 150 mcg  150 mcg Oral QAC breakfast Audery AmelJohn T Clapacs, MD   150 mcg at 03/30/16 [phone]  . magnesium hydroxide (MILK OF MAGNESIA) suspension 30 mL  30 mL Oral Daily PRN Audery AmelJohn T Clapacs, MD      . traZODone (DESYREL) tablet 150 mg  150 mg Oral QHS PRN Jimmy FootmanAndrea Hernandez-Gonzalez, MD        Lab Results:  Results for orders placed or performed during the hospital encounter of 03/28/16 (from the past 48 hour(s))  Thyroid Panel With TSH     Status: Abnormal   Collection Time: 03/28/16  7:39 PM  Result Value Ref Range   TSH 101.100 (H) 0.450 - 4.500 uIU/mL   T4, Total 3.0 (L) 4.5 - 12.0 ug/dL   T3 Uptake Ratio 18 (L) 24 - 39 %   Free Thyroxine Index 0.5 (L) 1.2 - 4.9    Comment: (NOTE) Performed At: [facility] [address] [place], [place] [id number] Mila HomerHancock William F MD NU:[phone]   Vitamin B12     Status: None   Collection Time: 03/29/16  6:56 AM  Result Value Ref Range   Vitamin B-12 344 180 - 914 pg/mL    Comment: (NOTE) This assay is not validated for testing neonatal or myeloproliferative syndrome specimens for Vitamin B12 levels. Performed at [facility], [address] [place], [place] [address]   Hemoglobin A1c     Status: Abnormal   Collection Time: 03/29/16  6:59 AM  Result Value Ref Range   Hgb A1c MFr Bld 4.7 (L) 4.8 - 5.6 %    Comment: (NOTE)         Pre-diabetes: 5.7 - 6.4         Diabetes: >6.4         Glycemic control for adults with diabetes: <7.0    Mean Plasma Glucose 88 mg/dL    Comment: (NOTE) Performed At: [facility] [address]  [place], [place] [id number] Mila HomerHancock William F MD EP:[phone]   Lipid panel     Status: Abnormal   Collection Time: 03/29/16  6:59 AM  Result Value Ref Range   Cholesterol 183 0 - 200 mg/dL   Triglycerides 606180 (H) <150 mg/dL   HDL 32 (L) >30>40 mg/dL   Total CHOL/HDL Ratio 5.7 RATIO   VLDL 36 0 - 40 mg/dL   LDL Cholesterol 160115 (H) 0 - 99 mg/dL    Comment:  Total Cholesterol/HDL:CHD Risk Coronary Heart Disease Risk Table                     Men   Women  1/2 Average Risk   3.4   3.3  Average Risk       5.0   4.4  2 X Average Risk   9.6   7.1  3 X Average Risk  23.4   11.0        Use the calculated Patient Ratio above and the CHD Risk Table to determine the patient's CHD Risk.        ATP III CLASSIFICATION (LDL):  <100     mg/dL   Optimal  454-098  mg/dL   Near or Above                    Optimal  130-159  mg/dL   Borderline  119-147  mg/dL   High  >829     mg/dL   Very High   Prolactin     Status: Abnormal   Collection Time: 03/29/16  6:59 AM  Result Value Ref Range   Prolactin 77.0 (H) 4.0 - 15.2 ng/mL    Comment: (NOTE) Performed At: [facility] [address] [place], [place] [id number] Mila Homer MD HQ:[id number]   Cortisol     Status: None   Collection Time: 03/29/16  6:59 AM  Result Value Ref Range   Cortisol, Plasma 23.7 ug/dL    Comment: (NOTE) AM    6.7 - 22.6 ug/dL PM   <13.2       ug/dL Performed at [facility], [address] [place], [place] [address]     Blood Alcohol level:  Lab Results  Component Value Date   ETH <5 03/27/2016    Metabolic Disorder Labs: Lab Results  Component Value Date   HGBA1C 4.7 (L) 03/29/2016   MPG 88 03/29/2016   Lab Results  Component Value Date   PROLACTIN 77.0 (H) 03/29/2016   Lab Results  Component Value Date   CHOL 183 03/29/2016   TRIG 180 (H) 03/29/2016   HDL 32 (L) 03/29/2016   CHOLHDL 5.7 03/29/2016   VLDL 36 03/29/2016   LDLCALC 115 (H) 03/29/2016   LDLCALC 151 (H)  01/10/2014    Physical Findings: AIMS:  , ,  ,  ,    CIWA:    COWS:     Musculoskeletal: Strength & Muscle Tone: within normal limits Gait & Station: normal Patient leans: N/A  Psychiatric Specialty Exam: Physical Exam  Constitutional: He is oriented to person, place, and time. He appears well-developed and well-nourished.  HENT:  Head: Normocephalic and atraumatic.  Eyes: Conjunctivae and EOM are normal.  Neck: Normal range of motion.  Respiratory: Effort normal.  Musculoskeletal: Normal range of motion.  Neurological: He is alert and oriented to person, place, and time.    Review of Systems  Constitutional: Negative.   HENT: Negative.   Eyes: Negative.   Respiratory: Negative.   Cardiovascular: Negative.   Skin: Negative.   Neurological: Negative.   Endo/Heme/Allergies: Negative.   Psychiatric/Behavioral: Positive for depression and suicidal ideas. Negative for hallucinations, memory loss and substance abuse. The patient is nervous/anxious and has insomnia.     Blood pressure 101/86, pulse (!) 59, temperature 97.7 F (36.5 C), temperature source Oral, resp. rate 20, height 5\' 11"  (1.803 m), weight 124.3 kg (274 lb), SpO2 100 %.Body mass index is 38.22 kg/m.  General Appearance: Fairly Groomed  Eye Contact:  Minimal  Speech:  Slow  Volume:  Decreased  Mood:  Dysphoric  Affect:  Blunt  Thought Process:  Linear and Descriptions of Associations: Intact  Orientation:  Full (Time, Place, and Person)  Thought Content:  Hallucinations: None  Suicidal Thoughts:  No  Homicidal Thoughts:  No  Memory:  Immediate;   Good Recent;   Good Remote;   Good  Judgement:  Poor  Insight:  Shallow  Psychomotor Activity:  Decreased  Concentration:  Concentration: Fair and Attention Span: Fair  Recall:  [employer] of Knowledge:  Fair  Language:  Good  Akathisia:  no  Handed:    AIMS (if indicated):     Assets:  Communication Skills Housing Social Support  ADL's:  Intact   Cognition:  WNL  Sleep:  Number of Hours: 7.5     Treatment Plan Summary:  Patient is a 26 year old Caucasian male with history of major depressive disorder. He presented to our emergency department after an overdose on ibuprofen. This is his first suicidal attempt. Looks like this suicidal attempt was triggered by some relational problems with friends and by not being compliant with treatment or medications for depression. Patient also has history of being none consistently compliant with Synthroid which he takes for hypothyroidism  For depressive disorder patient was treated successfully in his past admission with Wellbutrin. He has been restarted on Wellbutrin XL 150 mg by mouth daily  For insomnia: continue tyrazodone but will increase to 150 mg qhs  For hypothyroidism he will continue Synthroid 150 g daily.  TSH >101 due to poor compliance  Precautions every 15 minute checks  Diet regular  Hospitalization and status involuntary  Vital signs daily  Labs: b12 wnl  Collateral information will be obtained from his parents  Chart review completed  Jimmy Footman, MD 03/30/2016, 2:35 PM

## 2016-03-30 NOTE — [facility] Group Notes (Signed)
[facility] Group Notes:  (Nursing/MHT/Case Management/Adjunct)  Date:  03/30/2016  Time:  1:24 AM  Type of Therapy:  Psychoeducational Skills  Participation Level:  Active  Participation Quality:  Appropriate  Affect:  Appropriate  Cognitive:  Alert  Insight:  Good  Engagement in Group:  Engaged  Modes of Intervention:  Activity  Summary of Progress/Problems:  Mayra NeerJackie L Tyrena Gohr 03/30/2016, 1:24 AM

## 2016-03-30 NOTE — [facility] Group Notes (Signed)
[facility] LCSW Group Therapy Note  Type of Therapy and Topic:  Group Therapy:  Goals Group: SMART Goals  Participation Level: Patient attended group on this date and minimally participated in the group discussion.     Description of Group:   The purpose of a daily goals group is to assist and guide patients in setting recovery/wellness-related goals.  The objective is to set goals as they relate to the crisis in which they were admitted. Patients will be using SMART goal modalities to set measurable goals.  Characteristics of realistic goals will be discussed and patients will be assisted in setting and processing how one will reach their goal. Facilitator will also assist patients in applying interventions and coping skills learned in psycho-education groups to the SMART goal and process how one will achieve defined goal.  Therapeutic Goals: -Patients will develop and document one goal related to or their crisis in which brought them into treatment. -Patients will be guided by LCSW using SMART goal setting modality in how to set a measurable, attainable, realistic and time sensitive goal.  -Patients will process barriers in reaching goal. -Patients will process interventions in how to overcome and successful in reaching goal.   Summary of Patient Progress:  Patient Goal:  "I need to stay on my medications".    Therapeutic Modalities:   Motivational Interviewing Engineer, manufacturing systemsCognitive Behavioral Therapy Crisis Intervention Model SMART goals setting  Joe Aurich G. Garnette CzechSampson MSW, LCSWA 03/30/2016 10:59 AM

## 2016-03-30 NOTE — Plan of Care (Signed)
Problem: Health Behavior/Discharge Planning: Goal: Compliance with therapeutic regimen will improve Outcome: Progressing Patient med compliant and attending group

## 2016-03-30 NOTE — [facility] Group Notes (Signed)
[facility] LCSW Group Therapy  03/30/2016 11:24 AM  Type of Therapy:  Group Therapy  Participation Level:  Minimal  Participation Quality:  Attentive  Affect:  Appropriate  Cognitive:  Alert  Insight:  Improving  Engagement in Therapy:  Improving  Modes of Intervention:  Activity, Discussion, Education, Problem-solving, Reality Testing and Support  Summary of Progress/Problems: Balance in life: Patients will discuss the concept of balance and how it looks and feels to be unbalanced. Pt will identify areas in their life that is unbalanced and ways to become more balanced. They discussed what aspects in their lives has influenced their self care. Patients also discussed self care in the areas of self regulation/control, hygiene/appearance, sleep/relaxation, healthy leisure, healthy eating habits, exercise, inner peace/spirituality, self improvement, sobriety, and health management. They were challenged to identify changes that are needed in order to improve self care.  Daimian Sudberry G. Garnette CzechSampson MSW, LCSWA 03/30/2016, 11:24 AM

## 2016-03-30 NOTE — Progress Notes (Signed)
Pt isolative to self and room. Did go to groups and is medication compliant. Denies SI, HI, AVH. Reports time helped him feel better.  Encouragement and support offered. Safety checks maintained. Pt receptive and remains safe on unit with q 15 min checks.

## 2016-03-30 NOTE — Progress Notes (Signed)
Calm and cooperative. Walking up/down hall. C/o racing thoughts. States he has a lot on his mind. Passive SI, no plan. Ativan 2 mg po given prn at 0101 for anxiety. q 15 min checks maintained for safety. Will continue to monitor.

## 2016-03-30 NOTE — Progress Notes (Signed)
Recreation Therapy Notes  INPATIENT RECREATION THERAPY ASSESSMENT  Patient Details Name: Anastasia PallZackary Mark Macchi MRN: [id number] DOB: [birth year] Today's Date: 03/30/2016  Patient Stressors: Family, Relationship, Friends (Parents are trying to get partial custody of the patient's nephew and it has been a stressful situation - they think the dad with get custody; was spending time with a girl and she stopped talking to him recently; lack of supportive friends)  Coping Skills:   Isolate, Arguments, Avoidance, Exercise, Talking, Music, Sports  Personal Challenges: Anger, Communication, Concentration, Decision-Making, Expressing Yourself, Problem-Solving, Relationships, Self-Esteem/Confidence, Social Interaction, Stress Management, Time Management, Trusting Others  Leisure Interests (2+):  Exercise - Walking, Music - Listen  Awareness of Community Resources:  Yes  Community Resources:  [place], [place]  Current Use: Yes  If no, Barriers?:    Patient Strengths:  Caring  Patient Identified Areas of Improvement:  Being able to deal with the thoughts that go through his head all the time  Current Recreation Participation:  Playing video games, walking at the park, listening to music  Patient Goal for Hospitalization:  To find some direction  [place]  [place] Residence:  [place]   Current SI (including self-harm):  Yes ("Every now and then")  Current HI:  No  Consent to Intern Participation: N/A   Jacquelynn CreeGreene,Matisse Roskelley M, LRT/CTRS 03/30/2016, 4:31 PM

## 2016-03-30 NOTE — Progress Notes (Signed)
Recreation Therapy Notes  Date: 03.01.18 Time: 1:00 pm Location: Craft Room  Group Topic: Leisure Education  Goal Area(s) Addresses:  Patient will identify activities for each letter of the alphabet. Patient will verbalize ability to integrate positive leisure into life post d/c. Patient will verbalize ability to use leisure as a Associate Professorcoping skill.  Behavioral Response: Attentive, Interactive  Intervention: Leisure Alphabet.  Activity: Patients were given a leisure alphabet worksheet and were instructed to write healthy leisure activities for each letter of the alphabet.   Education: LRT educated patients on what they need to participate in leisure.  Education Outcome: Acknowledges education/In group clarification offered  Clinical Observations/Feedback: Patient wrote healthy leisure activities. Patient contributed to group discussion by stating some of the healthy leisure activities.  Jacquelynn CreeGreene,Dacie Mandel M, LRT/CTRS 03/30/2016 1:57 PM

## 2016-03-31 NOTE — Progress Notes (Signed)
Pt awake, alert, oriented and up on unit today. Isolative to room this morning, sleeping in his bed, refused breakfast. Attended some groups today. Minimally interacts. Medication complaint. Quiet, appears depressed. Verbally denies SI/HI/AVH on assessment this morning.. Per self inventory, pt endorses suicidal thoughts sometimes, but reports when he first woke up this morning, "it was a rocky start" and he did have suicidal thoughts when he initially completed his self inventory. Denies SI now. Reports poor sleep last night with the help of sleep medication, good appetite, low energy, good concentration. Rates depression 5/10, hopelessness 5/10, anxiety 7/10 (low 0-10 high). Today's goal is "practicing mindfulness techniques that beth showed me" by "practice them."   Support and encouragement provided. Medications administered as ordered with education. Safety maintianed with every 15 minute checks. Will continue to monitor.

## 2016-03-31 NOTE — Progress Notes (Signed)
[facility]  03/31/2016 9:35 AM Joe Haley  MRN:  [id number] Subjective:  Patient is a 26 year old Caucasian male. He presented to our emergency department on February 26 after an intentional overdose with 20 ibuprofen tablets.  Patient left a suicidal note for his parents telling them  Thank you and goodbye.  Patient was hospitalized in our unit due to depression backing 2015. He was discharged with a diagnosis of major depressive disorder. He also was found to have hypothyroidism at that time, his TSH was above 100. He was discharged on Synthroid and Wellbutrin. The patient says that he took the Wellbutrin for about 3 months and then discontinue the medication after that. He did not consistently follow up. He says that he wants to join the [employer] but one of the requirements is for him to be off any medications for a year.  Patient was seen recently at therapist and took that he also was inconsistent with his appointments.  Patient says that he has not worked for gone to school since 2015. He is states at home most of the time. For fun he plays video games. He liked a girl but she stopped talking to him a week ago. He thinks is because he was going into the [employer].  His best friend of 7 years doesn't really tell him much about his private life which makes the patient feel that his friend doesn't trust him.  Patient says he's been depressed, has been thinking about dying, has been is sleeping a lot. His appetite concentration and energy have been poor. He feels hopeless and helpless.  He is frustrated and upset that his suicidal attempt was not successful. He wishes he would have died.  3/1 patient tells me that part of him is glad that he is alive but another part of him wishes he had died. He continues to still feel very depressed. Appears hopeless. Says that he feels nobody ever is going to love him and that none of his friends really trust him.  He is very sad about not being  able to join the [employer] because he will have to take antidepressants.  3/2 patient was seen today in treatment team meeting. He appears a little bit brighter. He says that his goal is to work on his depression and his anxiety and trying to be more social and more verbal. . The patient continues to feel ambivalent about not dying after the overdose. Today he seems to be more willing to work towards recovery. He denies suicidality, homicidality or having auditory or visual hallucinations today. He denies side effects from medications or having any physical complaints. Patient has been attending groups    Per nursing: Calm and cooperative. Attends group. Interacting with peers and staff. Attends group. Med compliant. No PRNs given. No voiced thoughts of hurting himself.   Pt isolative to self and room. Did go to groups and is medication compliant. Denies SI, HI, AVH. Reports time helped him feel better.  Encouragement and support offered. Safety checks maintained. Pt receptive and remains safe on unit with q 15 min checks.  Principal Problem: Severe recurrent major depression without psychotic features (HCC) Diagnosis:   Patient Active Problem List   Diagnosis Date Noted  . Severe recurrent major depression without psychotic features (HCC) [F33.2] 03/28/2016  . Hypothyroidism [E03.9] 03/28/2016  . Ibuprofen overdose [T39.311A] 03/28/2016   Total Time spent with patient: 30 minutes  Past Psychiatric History: No prior suicidal attempts. Has had episodes of  cutting but this is not often. He has some superficial scars on his arms. One prior hospitalization here in our unit in 2015 for depression  Past Medical History:  Past Medical History:  Diagnosis Date  . Hypothyroidism    History reviewed. No pertinent surgical history.  Family History: History reviewed. No pertinent family history.  Family Psychiatric  History: Patient reports having a brother with alcoholism  Social History: He  lives with his parents. He is unemployed and does not go to school. He is single, never married doesn't have any children. He graduated from high school. He denies having any major academic opiate care problems school. 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 His hope was going to the [employer] next year. Denies any legal history History  Alcohol Use No     History  Drug Use No    Social History   Social History  . Marital status: Single    Spouse name: N/A  . Number of children: N/A  . Years of education: N/A   Social History Main Topics  . Smoking status: Never Smoker  . Smokeless tobacco: Never Used  . Alcohol use No  . Drug use: No  . Sexual activity: Not Currently   Other Topics Concern  . None   Social History Narrative  . None    Current Medications: Current Facility-Administered Medications  Medication Dose Route Frequency Provider Last Rate Last Dose  . acetaminophen (TYLENOL) tablet 650 mg  650 mg Oral Q6H PRN Audery Amel, MD      . alum & mag hydroxide-simeth (MAALOX/MYLANTA) 200-200-20 MG/5ML suspension 30 mL  30 mL Oral Q4H PRN Audery Amel, MD      . buPROPion (WELLBUTRIN XL) 24 hr tablet 300 mg  300 mg Oral Daily Audery Amel, MD   300 mg at 03/31/16 1610  . levothyroxine (SYNTHROID, LEVOTHROID) tablet 150 mcg  150 mcg Oral QAC breakfast Audery Amel, MD   150 mcg at 03/31/16 9604  . magnesium hydroxide (MILK OF MAGNESIA) suspension 30 mL  30 mL Oral Daily PRN Audery Amel, MD      . traZODone (DESYREL) tablet 150 mg  150 mg Oral QHS Jimmy Footman, MD   150 mg at 03/30/16 2307    Lab Results:  No results found for this or any previous visit (from the past 48 hour(s)).  Blood Alcohol level:  Lab Results  Component Value Date   ETH <5 03/27/2016    Metabolic Disorder Labs: Lab Results  Component Value Date   HGBA1C 4.7 (L) 03/29/2016   MPG 88 03/29/2016   Lab Results  Component Value Date    PROLACTIN 77.0 (H) 03/29/2016   Lab Results  Component Value Date   CHOL 183 03/29/2016   TRIG 180 (H) 03/29/2016   HDL 32 (L) 03/29/2016   CHOLHDL 5.7 03/29/2016   VLDL 36 03/29/2016   LDLCALC 115 (H) 03/29/2016   LDLCALC 151 (H) 01/10/2014    Physical Findings: AIMS:  , ,  ,  ,    CIWA:    COWS:     Musculoskeletal: Strength & Muscle Tone: within normal limits Gait & Station: normal Patient leans: N/A  Psychiatric Specialty Exam: Physical Exam  Constitutional: He is oriented to person, place, and time. He appears well-developed and well-nourished.  HENT:  Head: Normocephalic and atraumatic.  Eyes: Conjunctivae and EOM are normal.  Neck: Normal range of motion.  Respiratory: Effort normal.  Musculoskeletal: Normal range of motion.  Neurological: He is alert and oriented to person, place, and time.    Review of Systems  Constitutional: Negative.   HENT: Negative.   Eyes: Negative.   Respiratory: Negative.   Cardiovascular: Negative.   Skin: Negative.   Neurological: Negative.   Endo/Heme/Allergies: Negative.   Psychiatric/Behavioral: Positive for depression and suicidal ideas. Negative for hallucinations, memory loss and substance abuse. The patient is nervous/anxious and has insomnia.     Blood pressure 93/79, pulse 72, temperature 97.9 F (36.6 C), temperature source Oral, resp. rate 18, height 5\' 11"  (1.803 m), weight 124.3 kg (274 lb), SpO2 100 %.Body mass index is 38.22 kg/m.  General Appearance: Fairly Groomed  Eye Contact:  Minimal  Speech:  Slow  Volume:  Decreased  Mood:  Dysphoric  Affect:  Blunt  Thought Process:  Linear and Descriptions of Associations: Intact  Orientation:  Full (Time, Place, and Person)  Thought Content:  Hallucinations: None  Suicidal Thoughts:  No  Homicidal Thoughts:  No  Memory:  Immediate;   Good Recent;   Good Remote;   Good  Judgement:  Poor  Insight:  Shallow  Psychomotor Activity:  Decreased  Concentration:   Concentration: Fair and Attention Span: Fair  Recall:  [employer] of Knowledge:  Fair  Language:  Good  Akathisia:  no  Handed:    AIMS (if indicated):     Assets:  Communication Skills Housing Social Support  ADL's:  Intact  Cognition:  WNL  Sleep:  Number of Hours: 6     Treatment Plan Summary:  Patient is a 26 year old Caucasian male with history of major depressive disorder. He presented to our emergency department after an overdose on ibuprofen. This is his first suicidal attempt. Looks like this suicidal attempt was triggered by some relational problems with friends and by not being compliant with treatment or medications for depression. Patient also has history of being none consistently compliant with Synthroid which he takes for hypothyroidism  For depressive disorder patient was treated successfully in his past admission with Wellbutrin. He has been restarted on Wellbutrin XL 150 mg by mouth daily. No changes  For insomnia: continue trazodone 150 mg qhs ---slept 6 h last night  For hypothyroidism he will continue Synthroid 150 g daily.  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 Pt not on any antipsychotics.  Will refer to f/u with PCP.  He would benefit from endocrine evaluation.   Precautions every 15 minute checks  Diet regular  Hospitalization and status involuntary  Vital signs daily  Labs: b12 wnl  Head CT neg  Collateral information will be obtained from his parents  Chart review completed  Jimmy Footman, MD 03/31/2016, 9:35 AM

## 2016-03-31 NOTE — Tx Team (Signed)
Interdisciplinary Treatment and Diagnostic Plan Update  03/31/2016 Time of Session: 11:00 AM Joe Haley MRN: [id number]  Principal Diagnosis: Severe recurrent major depression without psychotic features [facility])  Secondary Diagnoses: Principal Problem:   Severe recurrent major depression without psychotic features (HCC) Active Problems:   Hypothyroidism   Ibuprofen overdose   Current Medications:  Current Facility-Administered Medications  Medication Dose Route Frequency Provider Last Rate Last Dose  . acetaminophen (TYLENOL) tablet 650 mg  650 mg Oral Q6H PRN Audery Amel, MD      . alum & mag hydroxide-simeth (MAALOX/MYLANTA) 200-200-20 MG/5ML suspension 30 mL  30 mL Oral Q4H PRN Audery Amel, MD      . buPROPion (WELLBUTRIN XL) 24 hr tablet 300 mg  300 mg Oral Daily Audery Amel, MD   300 mg at 03/31/16 1191  . levothyroxine (SYNTHROID, LEVOTHROID) tablet 150 mcg  150 mcg Oral QAC breakfast Audery Amel, MD   150 mcg at 03/31/16 4782  . magnesium hydroxide (MILK OF MAGNESIA) suspension 30 mL  30 mL Oral Daily PRN Audery Amel, MD      . traZODone (DESYREL) tablet 150 mg  150 mg Oral QHS Jimmy Footman, MD   150 mg at 03/30/16 2307   PTA Medications: Prescriptions Prior to Admission  Medication Sig Dispense Refill Last Dose  . levothyroxine (SYNTHROID, LEVOTHROID) 150 MCG tablet Take 150 mcg by mouth daily.   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   Physician Treatment Plan for Primary Diagnosis: Severe recurrent major depression without psychotic features (HCC) Long Term Goal(s): Improvement in symptoms so as ready for discharge Improvement in symptoms so as ready for  discharge   Short Term Goals: Ability to identify changes in lifestyle to reduce recurrence of condition will improve Ability to verbalize feelings will improve Ability to disclose and discuss suicidal ideas Compliance with prescribed medications will improve Ability to demonstrate self-control will improve Compliance with prescribed medications will improve Ability to identify triggers associated with substance abuse/mental health issues will improve  Medication Management: Evaluate patient's response, side effects, and tolerance of medication regimen.  Therapeutic Interventions: 1 to 1 sessions, Unit Group sessions and Medication administration.  Evaluation of Outcomes: Progressing  Physician Treatment Plan for Secondary Diagnosis: Principal Problem:   Severe recurrent major depression without psychotic features (HCC) Active Problems:   Hypothyroidism   Ibuprofen overdose  Long Term Goal(s): Improvement in symptoms so as ready for discharge Improvement in symptoms so as ready for discharge   Short Term Goals: Ability to identify changes in lifestyle to reduce recurrence of condition will improve Ability to verbalize feelings will improve Ability to disclose and discuss suicidal ideas Compliance with prescribed medications will improve Ability to demonstrate self-control will improve Compliance with prescribed medications will improve Ability to identify triggers associated with substance abuse/mental health issues will improve     Medication Management: Evaluate patient's response, side effects, and tolerance of medication regimen.  Therapeutic Interventions: 1 to 1 sessions, Unit Group sessions and Medication administration.  Evaluation of Outcomes: Progressing   RN Treatment Plan for Primary Diagnosis: Severe recurrent major depression without psychotic features (HCC) Long Term Goal(s): Knowledge of disease and therapeutic regimen to maintain health will improve  Short  Term Goals: Ability to remain free from injury will  improve, Ability to demonstrate self-control and Ability to disclose and discuss suicidal ideas  Medication Management: RN will administer medications as ordered by provider, will assess and evaluate patient's response and provide education to patient for prescribed medication. RN will report any adverse and/or side effects to prescribing provider.  Therapeutic Interventions: 1 on 1 counseling sessions, Psychoeducation, Medication administration, Evaluate responses to treatment, Monitor vital signs and CBGs as ordered, Perform/monitor CIWA, COWS, AIMS and Fall Risk screenings as ordered, Perform wound care treatments as ordered.  Evaluation of Outcomes: Progressing   LCSW Treatment Plan for Primary Diagnosis: Severe recurrent major depression without psychotic features (HCC) Long Term Goal(s): Safe transition to appropriate next level of care at discharge, Engage patient in therapeutic group addressing interpersonal concerns.  Short Term Goals: Engage patient in aftercare planning with referrals and resources, Increase social support, Increase ability to appropriately verbalize feelings and Increase emotional regulation  Therapeutic Interventions: Assess for all discharge needs, 1 to 1 time with Social worker, Explore available resources and support systems, Assess for adequacy in community support network, Educate family and significant other(s) on suicide prevention, Complete Psychosocial Assessment, Interpersonal group therapy.  Evaluation of Outcomes: Progressing    Recreational Therapy Treatment Plan for Primary Diagnosis: Severe recurrent major depression without psychotic features (HCC) Long Term Goal(s): Interest or engagement in recreation therapeutic interventions will improve  Short Term Goals: Increase self-esteem, Increase stress management skills  Treatment Modalities: Group Therapy and Individual Treatment  Sessions  Therapeutic Interventions: Psychoeducation  Evaluation of Outcomes: Progressing   Progress in Treatment: Attending groups: Yes. Participating in groups: Yes. Taking medication as prescribed: Yes. Toleration medication: Yes. Family/Significant other contact made: Yes, individual(s) contacted:  CSW contacted pt's parents. Patient understands diagnosis: Yes. Discussing patient identified problems/goals with staff: Yes. Medical problems stabilized or resolved: Yes. Denies suicidal/homicidal ideation: Yes. Issues/concerns per patient self-inventory: No.  New problem(s) identified: No, Describe:  None identified.  New Short Term/Long Term Goal(s): Pt stated goal is to get back on medications and engage in social support.  Discharge Plan or Barriers: Pt follow-up with [facility].  Reason for Continuation of Hospitalization: Anxiety Depression Suicidal ideation  Estimated Length of Stay: 3 days   Attendees: Patient: Joe Haley 03/31/2016 10:35 AM  Physician: Dr. Radene Journey, MD 03/31/2016 10:35 AM  Nursing: Leonia Reader, BSN, RN 03/31/2016 10:35 AM  RN Care Manager: 03/31/2016 10:35 AM  Social Worker: Hampton Abbot, MSW, LCSW-A 03/31/2016 10:35 AM  Recreational Therapist: Princella Ion, LRT/CTRS  03/31/2016 10:35 AM    Scribe for Treatment Team: Lynden Oxford, LCSWA 03/31/2016 10:35 AM

## 2016-03-31 NOTE — [facility] Group Notes (Signed)
[facility] LCSW Group Therapy  03/31/2016 3:38 PM  Type of Therapy:  Group Therapy  Participation Level:  Minimal  Participation Quality:  Appropriate  Affect:  Appropriate  Cognitive:  Alert  Insight:  Limited  Engagement in Therapy:  Limited  Modes of Intervention:  Activity, Discussion, Education, Problem-solving, Reality Testing and Support  Summary of Progress/Problems: Feelings around Relapse. Group members discussed the meaning of relapse and shared personal stories of relapse, how it affected them and others, and how they perceived themselves during this time. Group members were encouraged to identify triggers, warning signs and coping skills used when facing the possibility of relapse. Social supports were discussed and explored in detail. Patients also discussed facing disappointment and how that can trigger someone to relapse.   Steph Cheadle G. Garnette CzechSampson MSW, LCSWA 03/31/2016, 3:38 PM

## 2016-03-31 NOTE — Plan of Care (Signed)
Problem: Self-Concept: Goal: Ability to disclose and discuss suicidal ideas will improve Outcome: Progressing Pt endorsed suicidal thoughts "sometimes" per self inventory this morning, denies SI now.

## 2016-03-31 NOTE — Plan of Care (Signed)
Problem: Coping: Goal: Ability to cope will improve Outcome: Progressing Calm and cooperative. Attends group. Interacting with peers and staff. Attends group. Med compliant. No PRNs given. No voiced thoughts of hurting himself.

## 2016-03-31 NOTE — [facility] Group Notes (Signed)
[facility] Group Notes:  (Nursing/MHT/Case Management/Adjunct)  Date:  03/31/2016  Time:  3:47 AM  Type of Therapy:  Psychoeducational Skills  Participation Level:  Active  Participation Quality:  Appropriate, Drowsy and Sharing  Affect:  Appropriate  Cognitive:  Appropriate  Insight:  Appropriate and Good  Engagement in Group:  Engaged  Modes of Intervention:  Discussion, Socialization and Support  Summary of Progress/Problems:  Joe MilroyLaquanda Y Breia Haley 03/31/2016, 3:47 AM

## 2016-03-31 NOTE — Progress Notes (Signed)
Recreation Therapy Notes  Date: 03.02.18 Time: 1:00 pm Location: Craft Room  Group Topic: Communication, Problem Solving, Teamwork  Goal Area(s) Addresses:  Patient will effectively work with peer towards shared goal. Patient will identify skill used to make activity successful. Patient will identify how skills used during activity can be used to reach post d/c goals.  Behavioral Response: Attentive, Interactive  Intervention: Life Boat  Activity: Patients were given a scenario of going exploring and the boat getting a leak. Patients list of 16 people (Beyonce, Scientist, clinical (histocompatibility and immunogenetics)Bear grills, Runner, broadcasting/film/videoteacher, etc.) and were instructed to put 8 people on a nicer, faster boat with the patients and 8 people on a raft.  Education: LRT educated patients on healthy support systems.  Education Outcome: Acknowledges education/In group clarification offered  Clinical Observations/Feedback: Patient worked with peer towards goal. Patient used effective communication, teamwork, and problem solving skills. Patient contributed to group discussion by stating what skills he used in group, and why he picked the people they chose.  Jacquelynn CreeGreene,Patsye Sullivant M, LRT/CTRS 03/31/2016 1:43 PM

## 2016-03-31 NOTE — Plan of Care (Signed)
Problem: [facility] Participation in Recreation Therapeutic Interventions Goal: STG-Patient will demonstrate improved self esteem by identif STG: Self-Esteem - Within 4 treatment sessions, patient verbalize at least 5 positive affirmation statements in each of 2 treatment sessions to increase self-esteem.  Outcome: Progressing Treatment Session 1; Completed 1 out of 2: At approximately 2:35 pm, LRT met with patient in consultation room. Patient verbalized 5 positive affirmation statements. Patient reported it felt "kinda good". LRT encouraged patient to continue saying positive affirmation statements.  Leonette Monarch, LRT/CTRS 03.02.18 3:55 pm Goal: STG-Other Recreation Therapy Goal (Specify) STG: Stress Management - Within 4 treatment sessions, patient will verbalize understanding of the stress management techniques in each of 2 treatment sessions to increase stress management skills.  Outcome: Progressing Treatment Session 1; Completed 1 out of 2: At approximately 2:35 pm, LRT met with patient in consultation room. LRT educated and provided patient with handouts on stress management techniques. Patient verbalized understanding. LRT encouraged patient to read over and practice the stress management techniques.  Leonette Monarch, LRT/CTRS 03.02.18 3:56 pm

## 2016-04-01 DIAGNOSIS — G4700 Insomnia, unspecified: Secondary | ICD-10-CM

## 2016-04-01 DIAGNOSIS — T39312A Poisoning by propionic acid derivatives, intentional self-harm, initial encounter: Secondary | ICD-10-CM

## 2016-04-01 DIAGNOSIS — E039 Hypothyroidism, unspecified: Secondary | ICD-10-CM

## 2016-04-01 DIAGNOSIS — Z79899 Other long term (current) drug therapy: Secondary | ICD-10-CM

## 2016-04-01 DIAGNOSIS — T1491XA Suicide attempt, initial encounter: Secondary | ICD-10-CM

## 2016-04-01 DIAGNOSIS — Z811 Family history of alcohol abuse and dependence: Secondary | ICD-10-CM

## 2016-04-01 NOTE — Progress Notes (Signed)
Patient isolated in the room most of the time.Denies suicidal or homicidal ideations & AV hallucinations.Patient was sleeping most of the day.Minimal interactions with staff & peers.Compliant with medications.Appetite good.Energy level fair.Did not attend groups.Support & encouragement given.

## 2016-04-01 NOTE — Plan of Care (Signed)
Problem: Coping: Goal: Ability to cope will improve Outcome: Progressing Pt seen playing games with peers in the dayroom this evening

## 2016-04-01 NOTE — Progress Notes (Signed)
[facility] MD Progress Note  04/01/2016 10:45 AM Joe Haley  MRN:  [id number] Subjective:  Patient is a 26 year old Caucasian male. He presented to our emergency department on February 26 after an intentional overdose with 20 ibuprofen tablets.  Patient left a suicidal note for his parents telling them  Thank you and goodbye.  Patient was hospitalized in our unit due to depression backing 2015. He was discharged with a diagnosis of major depressive disorder. He also was found to have hypothyroidism at that time, his TSH was above 100. He was discharged on Synthroid and Wellbutrin. The patient says that he took the Wellbutrin for about 3 months and then discontinue the medication after that. He did not consistently follow up. He says that he wants to join the [employer] but one of the requirements is for him to be off any medications for a year.  Patient was seen recently at therapist and took that he also was inconsistent with his appointments.  Patient says that he has not worked for gone to school since 2015. He is states at home most of the time. For fun he plays video games. He liked a girl but she stopped talking to him a week ago. He thinks is because he was going into the [employer].  His best friend of 7 years doesn't really tell him much about his private life which makes the patient feel that his friend doesn't trust him.  Patient says he's been depressed, has been thinking about dying, has been is sleeping a lot. His appetite concentration and energy have been poor. He feels hopeless and helpless.  He is frustrated and upset that his suicidal attempt was not successful. He wishes he would have died.  3/1 patient tells me that part of him is glad that he is alive but another part of him wishes he had died. He continues to still feel very depressed. Appears hopeless. Says that he feels nobody ever is going to love him and that none of his friends really trust him.  He is very sad about not being  able to join the [employer] because he will have to take antidepressants.  04/01/2016 Patient seen in his room. Easily awake, states he is doing ok. Denies any SI. Per staff, stays isolated. He is not attending groups or interacting with peers or staff.  Per nursing patient is in, cooperative and has been attending groups  Principal Problem: Severe recurrent major depression without psychotic features [facility]) Diagnosis:   Patient Active Problem List   Diagnosis Date Noted  . Severe recurrent major depression without psychotic features (HCC) [F33.2] 03/28/2016  . Hypothyroidism [E03.9] 03/28/2016  . Ibuprofen overdose [T39.311A] 03/28/2016   Total Time spent with patient: 30 minutes  Past Psychiatric History: No prior suicidal attempts. Has had episodes of cutting but this is not often. He has some superficial scars on his arms. One prior hospitalization here in our unit in 2015 for depression  Past Medical History:  Past Medical History:  Diagnosis Date  . Hypothyroidism    History reviewed. No pertinent surgical history.  Family History: History reviewed. No pertinent family history.  Family Psychiatric  History: Patient reports having a brother with alcoholism  Social History: He lives with his parents. He is unemployed and does not go to school. He is single, never married doesn't have any children. He graduated from high school. He denies having any major academic opiate care problems school. 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 His hope was going to the [employer] next year. Denies any legal history History  Alcohol Use No     History  Drug Use No    Social History   Social History  . Marital status: Single    Spouse name: N/A  . Number of children: N/A  . Years of education: N/A   Social History Main Topics  . Smoking status: Never Smoker  . Smokeless tobacco: Never Used  . Alcohol use No  . Drug use: No  . Sexual activity: Not Currently    Other Topics Concern  . None   Social History Narrative  . None    Current Medications: Current Facility-Administered Medications  Medication Dose Route Frequency Provider Last Rate Last Dose  . acetaminophen (TYLENOL) tablet 650 mg  650 mg Oral Q6H PRN Audery AmelJohn T Clapacs, MD      . alum & mag hydroxide-simeth (MAALOX/MYLANTA) 200-200-20 MG/5ML suspension 30 mL  30 mL Oral Q4H PRN Audery AmelJohn T Clapacs, MD      . buPROPion (WELLBUTRIN XL) 24 hr tablet 300 mg  300 mg Oral Daily Audery AmelJohn T Clapacs, MD   300 mg at 04/01/16 0834  . levothyroxine (SYNTHROID, LEVOTHROID) tablet 150 mcg  150 mcg Oral QAC breakfast Audery AmelJohn T Clapacs, MD   150 mcg at 04/01/16 0834  . magnesium hydroxide (MILK OF MAGNESIA) suspension 30 mL  30 mL Oral Daily PRN Audery AmelJohn T Clapacs, MD      . traZODone (DESYREL) tablet 150 mg  150 mg Oral QHS Jimmy FootmanAndrea Hernandez-Gonzalez, MD   150 mg at 03/31/16 2258    Lab Results:  No results found for this or any previous visit (from the past 48 hour(s)).  Blood Alcohol level:  Lab Results  Component Value Date   ETH <5 03/27/2016    Metabolic Disorder Labs: Lab Results  Component Value Date   HGBA1C 4.7 (L) 03/29/2016   MPG 88 03/29/2016   Lab Results  Component Value Date   PROLACTIN 77.0 (H) 03/29/2016   Lab Results  Component Value Date   CHOL 183 03/29/2016   TRIG 180 (H) 03/29/2016   HDL 32 (L) 03/29/2016   CHOLHDL 5.7 03/29/2016   VLDL 36 03/29/2016   LDLCALC 115 (H) 03/29/2016   LDLCALC 151 (H) 01/10/2014    Physical Findings: AIMS:  , ,  ,  ,    CIWA:    COWS:     Musculoskeletal: Strength & Muscle Tone: within normal limits Gait & Station: normal Patient leans: N/A  Psychiatric Specialty Exam: Physical Exam  Constitutional: He is oriented to person, place, and time. He appears well-developed and well-nourished.  HENT:  Head: Normocephalic and atraumatic.  Eyes: Conjunctivae and EOM are normal.  Neck: Normal range of motion.  Respiratory: Effort normal.   Musculoskeletal: Normal range of motion.  Neurological: He is alert and oriented to person, place, and time.    Review of Systems  Constitutional: Negative.   HENT: Negative.   Eyes: Negative.   Respiratory: Negative.   Cardiovascular: Negative.   Skin: Negative.   Neurological: Negative.   Endo/Heme/Allergies: Negative.   Psychiatric/Behavioral: Positive for depression and suicidal ideas. Negative for hallucinations, memory loss and substance abuse. The patient is nervous/anxious and has insomnia.     Blood pressure 129/81, pulse (!) 56, temperature 97.8 F (36.6 C), resp. rate 18, height 5\' 11"  (1.803 m), weight 274 lb (124.3 kg), SpO2 100 %.Body mass index is 38.22 kg/m.  General  Appearance: Fairly Groomed  Eye Contact:  Minimal  Speech:  Slow  Volume:  Decreased  Mood:  Dysphoric  Affect:  Blunt  Thought Process:  Linear and Descriptions of Associations: Intact  Orientation:  Full (Time, Place, and Person)  Thought Content:  Hallucinations: None  Suicidal Thoughts:  No  Homicidal Thoughts:  No  Memory:  Immediate;   Good Recent;   Good Remote;   Good  Judgement:  Poor  Insight:  Shallow  Psychomotor Activity:  Decreased  Concentration:  Concentration: Fair and Attention Span: Fair  Recall:  [employer] of Knowledge:  Fair  Language:  Good  Akathisia:  no  Handed:    AIMS (if indicated):     Assets:  Communication Skills Housing Social Support  ADL's:  Intact  Cognition:  WNL  Sleep:  Number of Hours: 6     Treatment Plan Summary:  Patient is a 26 year old Caucasian male with history of major depressive disorder. He presented to our emergency department after an overdose on ibuprofen. This is his first suicidal attempt. Looks like this suicidal attempt was triggered by some relational problems with friends and by not being compliant with treatment or medications for depression. Patient also has history of being none consistently compliant with Synthroid which  he takes for hypothyroidism  For depressive disorder patient was treated successfully in his past admission with Wellbutrin, has been titrated to 300mg . Will continue to monitor.  For insomnia: continue tyrazodone but will increase to 150 mg qhs  For hypothyroidism he will continue Synthroid 150 g daily.  TSH >101 due to poor compliance  Precautions every 15 minute checks  Diet regular  Hospitalization and status involuntary  Vital signs daily  Labs: b12 wnl  Collateral information will be obtained from his parents  Chart review completed  Patrick North, MD 04/01/2016, 10:45 AM

## 2016-04-01 NOTE — Progress Notes (Signed)
D: Pt denies SI/HI/AVH. Pt is pleasant and cooperative. Pt seen interacting with peers in the dayroom . Pt stated he would like to have his sleep medications increased.   A: Pt was offered support and encouragement. Pt was given scheduled medications. Pt was encourage to attend groups. Q 15 minute checks were done for safety.   R:Pt attends groups and interacts well with peers and staff. Pt is taking medication. Pt has no complaints at this time .Pt receptive to treatment and safety maintained on unit.

## 2016-04-01 NOTE — Plan of Care (Signed)
Problem: Safety: Goal: Ability to remain free from injury will improve Outcome: Progressing Pt safe on the unit at this time   

## 2016-04-01 NOTE — Progress Notes (Signed)
D: Pt  Passive SI-contracts for safety. denies HI/AV. Pt is pleasant and cooperative. Pt stated he was feeling suicidal earlier in the day but had not felt like hurting himself anymore during the day. Pt seen interacting with peers this evening in the dayroom.   A: Pt was offered support and encouragement. Pt was given scheduled medications. Pt was encourage to attend groups. Q 15 minute checks were done for safety.   R:Pt attends groups and interacts well with peers and staff. Pt is taking medication. Pt has no complaints.Pt receptive to treatment and safety maintained on unit.

## 2016-04-01 NOTE — [facility] Group Notes (Signed)
[facility] LCSW Group Therapy  04/01/2016 2:59 PM  Type of Therapy:  Group Therapy  Participation Level:  Patient did not attend group. CSW invited patient to group.   Summary of Progress/Problems: Communications: Patients identify how individuals communicate with one another appropriately and inappropriately. Patients will be guided to discuss their thoughts, feelings, and behaviors related to barriers when communicating. The group will process together ways to execute positive and appropriate communications.   Andrew Soria G. Garnette CzechSampson MSW, LCSWA 04/01/2016, 2:59 PM

## 2016-04-02 MED ORDER — HYDROXYZINE HCL 25 MG PO TABS
25.0000 mg | ORAL_TABLET | Freq: Every evening | ORAL | Status: DC | PRN
  Administered 2016-04-02: 25 mg via ORAL
  Filled 2016-04-02: qty 1

## 2016-04-02 NOTE — Progress Notes (Signed)
Patient with appropriate affect, cooperative behavior with meals, meds and plan of care. No SI/HI at this time. Patient is dressed nicely this morning, in shorts, tee shirt and sneakers, sitting in dayroom watching tv with peers. MD adjusts sleep meds to include HS prn Vistaril in addition Trazodone. Safety maintained.

## 2016-04-02 NOTE — [facility] Group Notes (Signed)
[facility] Group Notes:  (Nursing/MHT/Case Management/Adjunct)  Date:  04/02/2016  Time:  2:44 AM  Type of Therapy:  Psychoeducational Skills  Participation Level:  Active  Participation Quality:  Appropriate  Affect:  Appropriate  Cognitive:  Appropriate  Insight:  Appropriate and Good  Engagement in Group:  Engaged  Modes of Intervention:  Discussion, Socialization and Support  Summary of Progress/Problems:  Chancy MilroyLaquanda Y Yohanna Tow 04/02/2016, 2:44 AM

## 2016-04-02 NOTE — Progress Notes (Signed)
[facility]  04/02/2016 10:47 AM Joe Haley  MRN:  [id number] Subjective:  Patient is a 26 year old Caucasian male. He presented to our emergency department on February 26 after an intentional overdose with 20 ibuprofen tablets.  3/1 patient tells me that part of him is glad that he is alive but another part of him wishes he had died. He continues to still feel very depressed. Appears hopeless. Says that he feels nobody ever is going to love him and that none of his friends really trust him.  He is very sad about not being able to join the [employer] because he will have to take antidepressants.  04/01/2016 Patient seen in his room. Easily awake, states he is doing ok. Denies any SI. Per staff, stays isolated. He is not attending groups or interacting with peers or staff.  04/02/2016 Patient seen today in dfay area and ambulating around. Reports feeling better. 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 He is more interactive today. Denies suicidal thoughts. Slept ok, waking up in few hours.  States his parents are very supportive.  Per nursing patient is in, cooperative and has been attending groups  Principal Problem: Severe recurrent major depression without psychotic features [facility](HCC) Diagnosis:   Patient Active Problem List   Diagnosis Date Noted  . Severe recurrent major depression without psychotic features (HCC) [F33.2] 03/28/2016  . Hypothyroidism [E03.9] 03/28/2016  . Ibuprofen overdose [T39.311A] 03/28/2016   Total Time spent with patient: 30 minutes  Past Psychiatric History: No prior suicidal attempts. Has had episodes of cutting but this is not often. He has some superficial scars on his arms. One prior hospitalization here in our unit in 2015 for depression  Past Medical History:  Past Medical History:  Diagnosis Date  . Hypothyroidism    History reviewed. No pertinent surgical history.  Family History: History reviewed. No  pertinent family history.  Family Psychiatric  History: Patient reports having a brother with alcoholism  Social History: He lives with his parents. He is unemployed and does not go to school. He is single, never married doesn't have any children. He graduated from high school. He denies having any major academic opiate care problems school. He says that he was a [employer], [employer] student but didn't put much effort into school. His hope was going to the [employer] next year. Denies any legal history History  Alcohol Use No     History  Drug Use No    Social History   Social History  . Marital status: Single    Spouse name: N/A  . Number of children: N/A  . Years of education: N/A   Social History Main Topics  . Smoking status: Never Smoker  . Smokeless tobacco: Never Used  . Alcohol use No  . Drug use: No  . Sexual activity: Not Currently   Other Topics Concern  . None   Social History Narrative  . None    Current Medications: Current Facility-Administered Medications  Medication Dose Route Frequency Provider Last Rate Last Dose  . acetaminophen (TYLENOL) tablet 650 mg  650 mg Oral Q6H PRN Audery AmelJohn T Clapacs, MD      . alum & mag hydroxide-simeth (MAALOX/MYLANTA) 200-200-20 MG/5ML suspension 30 mL  30 mL Oral Q4H PRN Audery AmelJohn T Clapacs, MD      . buPROPion (WELLBUTRIN XL) 24 hr tablet 300 mg  300 mg Oral Daily Audery AmelJohn T Clapacs, MD   300 mg at 04/02/16 0848  .  levothyroxine (SYNTHROID, LEVOTHROID) tablet 150 mcg  150 mcg Oral QAC breakfast Audery Amel, MD   150 mcg at 04/02/16 0849  . magnesium hydroxide (MILK OF MAGNESIA) suspension 30 mL  30 mL Oral Daily PRN Audery Amel, MD      . traZODone (DESYREL) tablet 150 mg  150 mg Oral QHS Jimmy Footman, MD   150 mg at 04/01/16 2248    Lab Results:  No results found for this or any previous visit (from the past 48 hour(s)).  Blood Alcohol level:  Lab Results  Component Value Date   ETH <5 03/27/2016    Metabolic Disorder Labs: Lab  Results  Component Value Date   HGBA1C 4.7 (L) 03/29/2016   MPG 88 03/29/2016   Lab Results  Component Value Date   PROLACTIN 77.0 (H) 03/29/2016   Lab Results  Component Value Date   CHOL 183 03/29/2016   TRIG 180 (H) 03/29/2016   HDL 32 (L) 03/29/2016   CHOLHDL 5.7 03/29/2016   VLDL 36 03/29/2016   LDLCALC 115 (H) 03/29/2016   LDLCALC 151 (H) 01/10/2014    Physical Findings: AIMS:  , ,  ,  ,    CIWA:    COWS:     Musculoskeletal: Strength & Muscle Tone: within normal limits Gait & Station: normal Patient leans: N/A  Psychiatric Specialty Exam: Physical Exam  Constitutional: He is oriented to person, place, and time. He appears well-developed and well-nourished.  HENT:  Head: Normocephalic and atraumatic.  Eyes: Conjunctivae and EOM are normal.  Neck: Normal range of motion.  Respiratory: Effort normal.  Musculoskeletal: Normal range of motion.  Neurological: He is alert and oriented to person, place, and time.    Review of Systems  Constitutional: Negative.   HENT: Negative.   Eyes: Negative.   Respiratory: Negative.   Cardiovascular: Negative.   Skin: Negative.   Neurological: Negative.   Endo/Heme/Allergies: Negative.   Psychiatric/Behavioral: Positive for depression and suicidal ideas. Negative for hallucinations, memory loss and substance abuse. The patient is nervous/anxious and has insomnia.     Blood pressure 124/71, pulse 70, temperature 97.8 F (36.6 C), resp. rate 18, height 5\' 11"  (1.803 m), weight 274 lb (124.3 kg), SpO2 100 %.Body mass index is 38.22 kg/m.  General Appearance: Fairly Groomed  Eye Contact:  Minimal  Speech:  Slow  Volume:  Decreased  Mood:  Dysphoric  Affect:  Blunt  Thought Process:  Linear and Descriptions of Associations: Intact  Orientation:  Full (Time, Place, and Person)  Thought Content:  Hallucinations: None  Suicidal Thoughts:  No  Homicidal Thoughts:  No  Memory:  Immediate;   Good Recent;   Good Remote;    Good  Judgement:  Poor  Insight:  Shallow  Psychomotor Activity:  Decreased  Concentration:  Concentration: Fair and Attention Span: Fair  Recall:  [employer] of Knowledge:  Fair  Language:  Good  Akathisia:  no  Handed:    AIMS (if indicated):     Assets:  Communication Skills Housing Social Support  ADL's:  Intact  Cognition:  WNL  Sleep:  Number of Hours: 5.3     Treatment Plan Summary:  Patient is a 26 year old Caucasian male with history of major depressive disorder. He presented to our emergency department after an overdose on ibuprofen. This is his first suicidal attempt. Looks like this suicidal attempt was triggered by some relational problems with friends and by not being compliant with treatment or medications for depression.  Patient also has history of being none consistently compliant with Synthroid which he takes for hypothyroidism  For depressive disorder patient was treated successfully in his past admission with Wellbutrin, has been titrated to 300mg . Will continue to monitor.  For insomnia: continue trazodone but will increase to 150 mg qhs, add vistaril 25mg   For hypothyroidism he will continue Synthroid 150 g daily.  TSH >101 due to poor compliance  Precautions every 15 minute checks  Diet regular  Hospitalization and status involuntary  Vital signs daily  Labs: b12 wnl  Collateral information will be obtained from his parents  Chart review completed  Patrick North, MD 04/02/2016, 10:47 AM

## 2016-04-02 NOTE — [facility] Group Notes (Signed)
[facility] Group Notes:  (Nursing/MHT/Case Management/Adjunct)  Date:  04/02/2016  Time:  10:54 PM  Type of Therapy:  Evening Wrap-up Group  Participation Level:  Active  Participation Quality:  Appropriate and Attentive  Affect:  Appropriate  Cognitive:  Alert and Appropriate  Insight:  Appropriate and Good  Engagement in Group:  Engaged  Modes of Intervention:  Discussion  Summary of Progress/Problems:  Tomasita MorrowChelsea Nanta Ediberto Sens 04/02/2016, 10:54 PM

## 2016-04-02 NOTE — [facility] Group Notes (Signed)
[facility] LCSW Group Therapy  04/02/2016 2:24 PM  Type of Therapy:  Group Therapy  Participation Level:  Active  Participation Quality:  Attentive  Affect:  Appropriate  Cognitive:  Alert  Insight:  Improving  Engagement in Therapy:  Improving  Modes of Intervention:  Activity, Discussion, Education, Problem-solving, Reality Testing and Support  Summary of Progress/Problems: Coping Skills: Patients defined and discussed healthy coping skills. Patients identified healthy coping skills they would like to try during hospitalization and after discharge. CSW offered insight to varying coping skills that may have been new to patients such as practicing mindfulness. Patient reports he is still not sleeping well. Patient states the mindful exercise helped with his anxiety.   Suly Vukelich G. Garnette CzechSampson MSW, LCSWA 04/02/2016, 2:27 PM

## 2016-04-03 MED ORDER — HYDROXYZINE HCL 50 MG PO TABS
50.0000 mg | ORAL_TABLET | Freq: Every day | ORAL | Status: DC
  Administered 2016-04-03 – 2016-04-04 (×2): 50 mg via ORAL
  Filled 2016-04-03 (×3): qty 1

## 2016-04-03 NOTE — [facility] Group Notes (Signed)
[facility] Group Notes:  (Nursing/MHT/Case Management/Adjunct)  Date:  04/03/2016  Time:  11:13 PM  Type of Therapy:  Psychoeducational Skills  Participation Level:  Active  Participation Quality:  Appropriate  Affect:  Appropriate  Cognitive:  Appropriate  Insight:  Good  Engagement in Group:  Supportive  Modes of Intervention:  Activity  Summary of Progress/Problems:  Joe Haley 04/03/2016, 11:13 PM

## 2016-04-03 NOTE — [facility] Group Notes (Signed)
[facility] Group Notes:  (Nursing/MHT/Case Management/Adjunct)  Date:  04/03/2016  Time:  5:54 PM  Type of Therapy:  Psychoeducational Skills  Participation Level:  Active  Participation Quality:  Appropriate, Attentive, Sharing and Supportive  Affect:  Appropriate  Cognitive:  Appropriate  Insight:  Appropriate  Engagement in Group:  Engaged and Supportive  Modes of Intervention:  Discussion and Education  Summary of Progress/Problems:  Joe Haley 04/03/2016, 5:54 PM

## 2016-04-03 NOTE — Progress Notes (Addendum)
Patient affect and mood remain sad and depressed.  Patient interaction with staff was minimal.  Patient seen interacting with other patients appropriately.  Patient denies SI/HI/AVH.  Patient took medication as prescribed.  Patient took PRN vistaril at bedtime which helped him sleep for 7hr .

## 2016-04-03 NOTE — Progress Notes (Signed)
[facility] ([place]) MD Progress Note  04/03/2016 12:54 PM Joe Haley  MRN:  [id number] Subjective:  Patient is a 26 year old Caucasian male. He presented to our emergency department on February 26 after an intentional overdose with 20 ibuprofen tablets.  Patient left a suicidal note for his parents telling them  Thank you and goodbye.  Patient was hospitalized in our unit due to depression backing 2015. He was discharged with a diagnosis of major depressive disorder. He also was found to have hypothyroidism at that time, his TSH was above 100. He was discharged on Synthroid and Wellbutrin. The patient says that he took the Wellbutrin for about 3 months and then discontinue the medication after that. He did not consistently follow up. He says that he wants to join the [employer] but one of the requirements is for him to be off any medications for a year.  Patient was seen recently at therapist and took that he also was inconsistent with his appointments.  Patient says that he has not worked for gone to school since 2015. He is states at home most of the time. For fun he plays video games. He liked a girl but she stopped talking to him a week ago. He thinks is because he was going into the [employer].  His best friend of 7 years doesn't really tell him much about his private life which makes the patient feel that his friend doesn't trust him.  Patient says he's been depressed, has been thinking about dying, has been is sleeping a lot. His appetite concentration and energy have been poor. He feels hopeless and helpless.  He is frustrated and upset that his suicidal attempt was not successful. He wishes he would have died.  3/1 patient tells me that part of him is glad that he is alive but another part of him wishes he had died. He continues to still feel very depressed. Appears hopeless. Says that he feels nobody ever is going to love him and that none of his friends really trust him.  He is very sad about not being  able to join the [employer] because he will have to take antidepressants.  3/2 patient was seen today in treatment team meeting. He appears a little bit brighter. He says that his goal is to work on his depression and his anxiety and trying to be more social and more verbal. . The patient continues to feel ambivalent about not dying after the overdose. Today he seems to be more willing to work towards recovery. He denies suicidality, homicidality or having auditory or visual hallucinations today. He denies side effects from medications or having any physical complaints. Patient has been attending groups    04/01/2016 Patient seen in his room. Easily awake, states he is doing ok. Denies any SI. Per staff, stays isolated. He is not attending groups or interacting with peers or staff.  04/02/2016 Patient seen today in dfay area and ambulating around. Reports feeling better. 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 He is more interactive today. Denies suicidal thoughts. Slept ok, waking up in few hours.  States his parents are very supportive.  Per nursing patient is in, cooperative and has been attending groups  3/5 patient reports feeling more hopeful. Seen his future more brighter. He was to continue with treatment and medications. He understands the need for it. Says that he is not longer suicidal, denies homicidality or auditory or visual hallucinations. He denies side effects from  medications. He tells me he wasn't sleeping well on the trazodone. Over the weekend Vistaril was side and he found that the combination of both and was beneficial. He slept well last night. He has been attending groups. Patient met with the recreational therapist some pride and he felt that that meeting was  very beneficial. He has been practicing mindfulness since that day   Per nursing:  Patient affect and mood remain sad and depressed.  Patient interaction with staff was minimal.   Patient seen interacting with other patients appropriately.  Patient denies SI/HI/AVH.  Patient took medication as prescribed.  Patient took PRN vistaril at bedtime which helped him sleep for 7hr 11mn.  Patient with appropriate affect, cooperative behavior with meals, meds and plan of care. No SI/HI at this time. Patient is dressed nicely this morning, in shorts, tee shirt and sneakers, sitting in dayroom watching tv with peers. MD adjusts sleep meds to include HS prn Vistaril in addition Trazodone. Safety maintained.    Principal Problem: Severe recurrent major depression without psychotic features ([place] Diagnosis:   Patient Active Problem List   Diagnosis Date Noted  . Severe recurrent major depression without psychotic features ([place] [F33.2] 03/28/2016  . Hypothyroidism [E03.9] 03/28/2016  . Ibuprofen overdose [T39.311A] 03/28/2016   Total Time spent with patient: 30 minutes  Past Psychiatric History: No prior suicidal attempts. Has had episodes of cutting but this is not often. He has some superficial scars on his arms. One prior hospitalization here in our unit in 2015 for depression  Past Medical History:  Past Medical History:  Diagnosis Date  . Hypothyroidism    History reviewed. No pertinent surgical history.  Family History: History reviewed. No pertinent family history.  Family Psychiatric  History: Patient reports having a brother with alcoholism  Social History: He lives with his parents. He is unemployed and does not go to school. He is single, never married doesn't have any children. He graduated from high school. He denies having any major academic opiate care problems school. 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 His hope was going to the [employer] next year. Denies any legal history History  Alcohol Use No     History  Drug Use No    Social History   Social History  . Marital status: Single    Spouse name: N/A  . Number of  children: N/A  . Years of education: N/A   Social History Main Topics  . Smoking status: Never Smoker  . Smokeless tobacco: Never Used  . Alcohol use No  . Drug use: No  . Sexual activity: Not Currently   Other Topics Concern  . None   Social History Narrative  . None    Current Medications: Current Facility-Administered Medications  Medication Dose Route Frequency Provider Last Rate Last Dose  . acetaminophen (TYLENOL) tablet 650 mg  650 mg Oral Q6H PRN JGonzella Lex MD      . alum & mag hydroxide-simeth (MAALOX/MYLANTA) 200-200-20 MG/5ML suspension 30 mL  30 mL Oral Q4H PRN JGonzella Lex MD      . buPROPion (WELLBUTRIN XL) 24 hr tablet 300 mg  300 mg Oral Daily JGonzella Lex MD   300 mg at 04/03/16 0840  . hydrOXYzine (ATARAX/VISTARIL) tablet 50 mg  50 mg Oral QHS AHildred Priest MD      . levothyroxine (SYNTHROID, LEVOTHROID) tablet 150 mcg  150 mcg Oral QAC breakfast John T Clapacs,  MD   150 mcg at 04/03/16 0840  . magnesium hydroxide (MILK OF MAGNESIA) suspension 30 mL  30 mL Oral Daily PRN Gonzella Lex, MD        Lab Results:  No results found for this or any previous visit (from the past 48 hour(s)).  Blood Alcohol level:  Lab Results  Component Value Date   ETH <5 16/01/930    Metabolic Disorder Labs: Lab Results  Component Value Date   HGBA1C 4.7 (L) 03/29/2016   MPG 88 03/29/2016   Lab Results  Component Value Date   PROLACTIN 77.0 (H) 03/29/2016   Lab Results  Component Value Date   CHOL 183 03/29/2016   TRIG 180 (H) 03/29/2016   HDL 32 (L) 03/29/2016   CHOLHDL 5.7 03/29/2016   VLDL 36 03/29/2016   LDLCALC 115 (H) 03/29/2016   LDLCALC 151 (H) 01/10/2014    Physical Findings: AIMS:  , ,  ,  ,    CIWA:    COWS:     Musculoskeletal: Strength & Muscle Tone: within normal limits Gait & Station: normal Patient leans: N/A  Psychiatric Specialty Exam: Physical Exam  Constitutional: He is oriented to person, place, and time.  He appears well-developed and well-nourished.  HENT:  Head: Normocephalic and atraumatic.  Eyes: Conjunctivae and EOM are normal.  Neck: Normal range of motion.  Respiratory: Effort normal.  Musculoskeletal: Normal range of motion.  Neurological: He is alert and oriented to person, place, and time.    Review of Systems  Constitutional: Negative.   HENT: Negative.   Eyes: Negative.   Respiratory: Negative.   Cardiovascular: Negative.   Skin: Negative.   Neurological: Negative.   Endo/Heme/Allergies: Negative.   Psychiatric/Behavioral: Positive for depression and suicidal ideas. Negative for hallucinations, memory loss and substance abuse. The patient is nervous/anxious and has insomnia.     Blood pressure 130/76, pulse 61, temperature 97.7 F (36.5 C), temperature source Oral, resp. rate 18, height '5\' 11"'  (1.803 m), weight 124.3 kg (274 lb), SpO2 100 %.Body mass index is 38.22 kg/m.  General Appearance: Fairly Groomed  Eye Contact:  Minimal  Speech:  Slow  Volume:  Decreased  Mood:  Dysphoric  Affect:  Blunt  Thought Process:  Linear and Descriptions of Associations: Intact  Orientation:  Full (Time, Place, and Person)  Thought Content:  Hallucinations: None  Suicidal Thoughts:  No  Homicidal Thoughts:  No  Memory:  Immediate;   Good Recent;   Good Remote;   Good  Judgement:  Poor  Insight:  Shallow  Psychomotor Activity:  Decreased  Concentration:  Concentration: Fair and Attention Span: Fair  Recall:  [employer] of Knowledge:  Fair  Language:  Good  Akathisia:  no  Handed:    AIMS (if indicated):     Assets:  Communication Skills Housing Social Support  ADL's:  Intact  Cognition:  WNL  Sleep:  Number of Hours: 7.25     Treatment Plan Summary:  Patient is a 26 year old Caucasian male with history of major depressive disorder. He presented to our emergency department after an overdose on ibuprofen. This is his first suicidal attempt. Looks like this suicidal  attempt was triggered by some relational problems with friends and by not being compliant with treatment or medications for depression. Patient also has history of being none consistently compliant with Synthroid which he takes for hypothyroidism  For depressive disorder patient was treated successfully in his past admission with Wellbutrin. Continue Wellbutrin which was  titrated up to 300 mg over the weekend.  For insomnia: Patient has not been responding well to trazodone. I will discontinue that and start Vistaril 50 mg daily at bedtime  For hypothyroidism he will continue Synthroid 150 g daily.  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 Pt not on any antipsychotics.  Will refer to f/u with PCP.  He would benefit from endocrine evaluation.   Precautions every 15 minute checks  Diet regular  Hospitalization and status involuntary  Vital signs daily  Labs: b12 wnl  Head CT neg  Collateral information will be obtained from his parents  Chart review completed  Possible discharge in the next 2-3 days  Hildred Priest, MD 04/03/2016, 12:54 PM

## 2016-04-03 NOTE — Progress Notes (Signed)
Patient is pleasant & cooperative.Denies suicidal or homicidal ideations and AV hallucinations.Interacting with peers well.Compliant with medications.Attended groups.Appetite & energy level good.Support & encouragement given.

## 2016-04-03 NOTE — Tx Team (Signed)
Interdisciplinary Treatment and Diagnostic Plan Update  04/03/2016 Time of Session:10:30am  Joe Haley  MRN: [id number]  Principal Diagnosis: Severe recurrent major depression without psychotic features [facility])  Secondary Diagnoses: Principal Problem:   Severe recurrent major depression without psychotic features (HCC) Active Problems:   Hypothyroidism   Ibuprofen overdose   Current Medications:  Current Facility-Administered Medications  Medication Dose Route Frequency Provider Last Rate Last Dose  . acetaminophen (TYLENOL) tablet 650 mg  650 mg Oral Q6H PRN Audery Amel, MD      . alum & mag hydroxide-simeth (MAALOX/MYLANTA) 200-200-20 MG/5ML suspension 30 mL  30 mL Oral Q4H PRN Audery Amel, MD      . buPROPion (WELLBUTRIN XL) 24 hr tablet 300 mg  300 mg Oral Daily Audery Amel, MD   300 mg at 04/03/16 0840  . hydrOXYzine (ATARAX/VISTARIL) tablet 50 mg  50 mg Oral QHS Jimmy Footman, MD      . levothyroxine (SYNTHROID, LEVOTHROID) tablet 150 mcg  150 mcg Oral QAC breakfast Audery Amel, MD   150 mcg at 04/03/16 0840  . magnesium hydroxide (MILK OF MAGNESIA) suspension 30 mL  30 mL Oral Daily PRN Audery Amel, MD       PTA Medications: Prescriptions Prior to Admission  Medication Sig Dispense Refill Last Dose  . levothyroxine (SYNTHROID, LEVOTHROID) 150 MCG tablet Take 150 mcg by mouth daily.   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   Physician Treatment Plan for Primary Diagnosis: Severe recurrent major depression without psychotic features (HCC) Long Term Goal(s): Improvement in symptoms so as ready for discharge Improvement in symptoms so as ready for  discharge   Short Term Goals: Ability to identify changes in lifestyle to reduce recurrence of condition will improve Ability to verbalize feelings will improve Ability to disclose and discuss suicidal ideas Compliance with prescribed medications will improve Ability to demonstrate self-control will improve Compliance with prescribed medications will improve Ability to identify triggers associated with substance abuse/mental health issues will improve  Medication Management: Evaluate patient's response, side effects, and tolerance of medication regimen.  Therapeutic Interventions: 1 to 1 sessions, Unit Group sessions and Medication administration.  Evaluation of Outcomes: Progressing  Physician Treatment Plan for Secondary Diagnosis: Principal Problem:   Severe recurrent major depression without psychotic features (HCC) Active Problems:   Hypothyroidism   Ibuprofen overdose  Long Term Goal(s): Improvement in symptoms so as ready for discharge Improvement in symptoms so as ready for discharge   Short Term Goals: Ability to identify changes in lifestyle to reduce recurrence of condition will improve Ability to verbalize feelings will improve Ability to disclose and discuss suicidal ideas Compliance with prescribed medications will improve Ability to demonstrate self-control will improve Compliance with prescribed medications will improve Ability to identify triggers associated with substance abuse/mental health issues will improve     Medication Management: Evaluate patient's response, side effects, and tolerance of medication regimen.  Therapeutic Interventions: 1 to 1 sessions, Unit Group sessions and Medication administration.  Evaluation of Outcomes: Progressing   RN Treatment Plan for Primary Diagnosis: Severe recurrent major depression without psychotic features (HCC) Long Term Goal(s): Knowledge of disease and therapeutic regimen to maintain health will  improve  Short Term Goals: Ability to remain free from injury will improve, Ability to  demonstrate self-control and Ability to disclose and discuss suicidal ideas  Medication Management: RN will administer medications as ordered by provider, will assess and evaluate patient's response and provide education to patient for prescribed medication. RN will report any adverse and/or side effects to prescribing provider.  Therapeutic Interventions: 1 on 1 counseling sessions, Psychoeducation, Medication administration, Evaluate responses to treatment, Monitor vital signs and CBGs as ordered, Perform/monitor CIWA, COWS, AIMS and Fall Risk screenings as ordered, Perform wound care treatments as ordered.  Evaluation of Outcomes: Progressing   LCSW Treatment Plan for Primary Diagnosis: Severe recurrent major depression without psychotic features (HCC) Long Term Goal(s): Safe transition to appropriate next level of care at discharge, Engage patient in therapeutic group addressing interpersonal concerns.  Short Term Goals: Engage patient in aftercare planning with referrals and resources, Increase social support, Increase ability to appropriately verbalize feelings and Increase emotional regulation  Therapeutic Interventions: Assess for all discharge needs, 1 to 1 time with Social worker, Explore available resources and support systems, Assess for adequacy in community support network, Educate family and significant other(s) on suicide prevention, Complete Psychosocial Assessment, Interpersonal group therapy.  Evaluation of Outcomes: Progressing   Progress in Treatment: Attending groups: Yes. Participating in groups: Yes. Taking medication as prescribed: Yes. Toleration medication: Yes. Family/Significant other contact made: Yes, individual(s) contacted:  CSW contacted pt's parents. Patient understands diagnosis: Yes. Discussing patient identified problems/goals with staff: Yes. Medical  problems stabilized or resolved: Yes. Denies suicidal/homicidal ideation: Yes. Issues/concerns per patient self-inventory: No.  New problem(s) identified: No, Describe:  None identified.  New Short Term/Long Term Goal(s): Pt stated goal is to get back on medications and engage in social support.  Discharge Plan or Barriers: CSW assessing proper contacts.  Reason for Continuation of Hospitalization: Anxiety Depression Suicidal ideation  Estimated Length of Stay:1-2 days    Attendees: Patient:Joe Haley 04/03/2016 4:47 PM  Physician: Ardyth HarpsHernandez 04/03/2016 4:47 PM  Nursing: Hulan AmatoGwen Farrish, RN 04/03/2016 4:47 PM  RN Care Manager: 04/03/2016 4:47 PM  Social Worker: Jake SharkSara Coty Larsh, LCSW 04/03/2016 4:47 PM  Recreational Therapist:  04/03/2016 4:47 PM  Other:  04/03/2016 4:47 PM  Other:  04/03/2016 4:47 PM  Other: 04/03/2016 4:47 PM    Scribe for Treatment Team: Glennon MacSara P Keyatta Tolles, LCSW 04/03/2016 4:47 PM

## 2016-04-03 NOTE — [facility] Group Notes (Signed)
[facility] LCSW Group Therapy Note  Date/Time: 04/03/16, 0930  Type of Therapy and Topic:  Group Therapy:  Overcoming Obstacles  Participation Level:  active  Description of Group:    In this group patients will be encouraged to explore what they see as obstacles to their own wellness and recovery. They will be guided to discuss their thoughts, feelings, and behaviors related to these obstacles. The group will process together ways to cope with barriers, with attention given to specific choices patients can make. Each patient will be challenged to identify changes they are motivated to make in order to overcome their obstacles. This group will be process-oriented, with patients participating in exploration of their own experiences as well as giving and receiving support and challenge from other group members.  Therapeutic Goals: 1. Patient will identify personal and current obstacles as they relate to admission. 2. Patient will identify barriers that currently interfere with their wellness or overcoming obstacles.  3. Patient will identify feelings, thought process and behaviors related to these barriers. 4. Patient will identify two changes they are willing to make to overcome these obstacles:    Summary of Patient Progress: pt identified loss of friendships and depression as current obstacles leading to his hospital admission.  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      Therapeutic Modalities:   Cognitive Behavioral Therapy Solution Focused Therapy Motivational Interviewing Relapse Prevention Therapy  Daleen SquibbGreg Genelda Roark, LCSW

## 2016-04-04 MED ORDER — HYDROXYZINE HCL 50 MG PO TABS: 50.0000 mg | ORAL_TABLET | Freq: Every evening | ORAL | 0 refills | Status: DC | PRN

## 2016-04-04 MED ORDER — BUPROPION HCL ER (XL) 300 MG PO TB24
300.0000 mg | ORAL_TABLET | Freq: Every day | ORAL | 0 refills | Status: DC
Start: 2016-04-05 — End: 2017-05-25

## 2016-04-04 NOTE — [facility] Group Notes (Signed)
Goals Group Date/Time: 04/04/2016 9:00 AM Type of Therapy and Topic: Group Therapy: Goals Group: SMART Goals   Participation Level: Moderate  Description of Group:    The purpose of a daily goals group is to assist and guide patients in setting recovery/wellness-related goals. The objective is to set goals as they relate to the crisis in which they were admitted. Patients will be using SMART goal modalities to set measurable goals. Characteristics of realistic goals will be discussed and patients will be assisted in setting and processing how one will reach their goal. Facilitator will also assist patients in applying interventions and coping skills learned in psycho-education groups to the SMART goal and process how one will achieve defined goal.   Therapeutic Goals:   -Patients will develop and document one goal related to or their crisis in which brought them into treatment.  -Patients will be guided by LCSW using SMART goal setting modality in how to set a measurable, attainable, realistic and time sensitive goal.  -Patients will process barriers in reaching goal.  -Patients will process interventions in how to overcome and successful in reaching goal.   Patient's Goal: Pt reports his discharge date is currently Wednesday, but he believes he is doing well enough to discharge today.  Goal is to speak with MD about possible discharge today.   Therapeutic Modalities:  Motivational Interviewing  Research officer, political partyCognitive Behavioral Therapy  Crisis Intervention Model  SMART goals setting   Daleen SquibbGreg Anais Denslow, [place]CS[place]

## 2016-04-04 NOTE — Progress Notes (Signed)
Recreation Therapy Notes  Date: 03.06.18 Time: 3:00 pm Location: Craft Room  Group Topic: Self-expression  Goal Area(s) Addresses:  Patient will be able to identify a color that represents each emotion. Patient will verbalize benefit of using art as a means of self-expression. Patient will verbalize one emotion experienced while participating in activity.  Behavioral Response: Attentive, Interactive  Intervention: The Colors Within Me  Activity: Patients were given a blank face worksheet and were instructed pick a color for each emotion they were feeling and show on the worksheet how much of the emotion they were feeling.  Education: LRT educated patients on other forms of self-expression.  Education Outcome: Acknowledges education/In group clarification offered   Clinical Observations/Feedback: Patient picked a color for each emotion he was feeling and showed on the worksheet how much of the emotion he was feeling. Patient contributed to group discussion by stating what emotions he was feeling, how his emotions will change once he is able to d/c, what he can do to help his emotions be more positive, and that it was helpful for him to see his emotions on paper.  Jacquelynn CreeGreene,Ikea Demicco M, LRT/CTRS 04/04/2016 3:46 PM

## 2016-04-04 NOTE — Progress Notes (Addendum)
[facility] MD Progress Note  04/04/2016 2:06 PM Joe Haley  MRN:  [id number] Subjective:  Patient is a 26 year old Caucasian male. He presented to our emergency department on February 26 after an intentional overdose with 20 ibuprofen tablets.  Patient left a suicidal note for his parents telling them  Thank you and goodbye.  Patient was hospitalized in our unit due to depression backing 2015. He was discharged with a diagnosis of major depressive disorder. He also was found to have hypothyroidism at that time, his TSH was above 100. He was discharged on Synthroid and Wellbutrin. The patient says that he took the Wellbutrin for about 3 months and then discontinue the medication after that. He did not consistently follow up. He says that he wants to join the [employer] but one of the requirements is for him to be off any medications for a year.  Patient was seen recently at therapist and took that he also was inconsistent with his appointments.  Patient says that he has not worked for gone to school since 2015. He is states at home most of the time. For fun he plays video games. He liked a girl but she stopped talking to him a week ago. He thinks is because he was going into the [employer].  His best friend of 7 years doesn't really tell him much about his private life which makes the patient feel that his friend doesn't trust him.  Patient says he's been depressed, has been thinking about dying, has been is sleeping a lot. His appetite concentration and energy have been poor. He feels hopeless and helpless.  He is frustrated and upset that his suicidal attempt was not successful. He wishes he would have died.  3/1 patient tells me that part of him is glad that he is alive but another part of him wishes he had died. He continues to still feel very depressed. Appears hopeless. Says that he feels nobody ever is going to love him and that none of his friends really trust him.  He is very sad about not being  able to join the [employer] because he will have to take antidepressants.  3/2 patient was seen today in treatment team meeting. He appears a little bit brighter. He says that his goal is to work on his depression and his anxiety and trying to be more social and more verbal. . The patient continues to feel ambivalent about not dying after the overdose. Today he seems to be more willing to work towards recovery. He denies suicidality, homicidality or having auditory or visual hallucinations today. He denies side effects from medications or having any physical complaints. Patient has been attending groups    04/01/2016 Patient seen in his room. Easily awake, states he is doing ok. Denies any SI. Per staff, stays isolated. He is not attending groups or interacting with peers or staff.  04/02/2016 Patient seen today in dfay area and ambulating around. Reports feeling better. 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 He is more interactive today. Denies suicidal thoughts. Slept ok, waking up in few hours.  States his parents are very supportive.  Per nursing patient is in, cooperative and has been attending groups  3/5 patient reports feeling more hopeful. Seen his future more brighter. He was to continue with treatment and medications. He understands the need for it. Says that he is not longer suicidal, denies homicidality or auditory or visual hallucinations. He denies side effects from  medications. He tells me he wasn't sleeping well on the trazodone. Over the weekend Vistaril was side and he found that the combination of both and was beneficial. He slept well last night. He has been attending groups. Patient met with the recreational therapist some pride and he felt that that meeting was  very beneficial. He has been practicing mindfulness since that day  3/6 Patient reports feeling hopeful and optimistic about the future. He wants to continue treatment and medications  after discharge. Also looking to enroll in treatment camp for individuals with mental health concerns and learn coping/lifeskills. Family will help with that, good support. He is also practicing mindfulness and being more positive. Denies suicidal/homicidal ideations or auditory/visual hallucinations. Slept fairly well on Vistaril; he did wake up once but was able to fall back asleep. Does not report any side effects to his current meds. He has been attending groups. Expressed he would like to be d/c today but agreed to stay until tomorrow on the basis of still needing to touch base with family and discuss plan of action post-discharge.   Per nursing:  Patient is pleasant & cooperative.Denies suicidal or homicidal ideations and AV hallucinations.Interacting with peers well.Compliant with medications.Attended groups.Appetite & energy level good.Support & encouragement given.  Patient with appropriate affect, cooperative behavior with meals, meds and plan of care. No SI/HI at this time. Patient is dressed nicely this morning, in shorts, tee shirt and sneakers, sitting in dayroom watching tv with peers. MD adjusts sleep meds to include HS prn Vistaril in addition Trazodone. Safety maintained.    Principal Problem: Severe recurrent major depression without psychotic features ([place]) Diagnosis:   Patient Active Problem List   Diagnosis Date Noted  . Severe recurrent major depression without psychotic features ([place]) [F33.2] 03/28/2016  . Hypothyroidism [E03.9] 03/28/2016  . Ibuprofen overdose [T39.311A] 03/28/2016   Total Time spent with patient: 30 minutes  Past Psychiatric History: No prior suicidal attempts. Has had episodes of cutting but this is not often. He has some superficial scars on his arms. One prior hospitalization here in our unit in 2015 for depression  Past Medical History:  Past Medical History:  Diagnosis Date  . Hypothyroidism    History reviewed. No pertinent surgical  history.  Family History: History reviewed. No pertinent family history.  Family Psychiatric  History: Patient reports having a brother with alcoholism  Social History: He lives with his parents. He is unemployed and does not go to school. He is single, never married doesn't have any children. He graduated from high school. He denies having any major academic opiate care problems school. 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 His hope was going to the [employer] next year. Denies any legal history History  Alcohol Use No     History  Drug Use No    Social History   Social History  . Marital status: Single    Spouse name: N/A  . Number of children: N/A  . Years of education: N/A   Social History Main Topics  . Smoking status: Never Smoker  . Smokeless tobacco: Never Used  . Alcohol use No  . Drug use: No  . Sexual activity: Not Currently   Other Topics Concern  . None   Social History Narrative  . None    Current Medications: Current Facility-Administered Medications  Medication Dose Route Frequency Provider Last Rate Last Dose  . acetaminophen (TYLENOL) tablet 650 mg  650 mg  Oral Q6H PRN Gonzella Lex, MD      . alum & mag hydroxide-simeth (MAALOX/MYLANTA) 200-200-20 MG/5ML suspension 30 mL  30 mL Oral Q4H PRN Gonzella Lex, MD      . buPROPion (WELLBUTRIN XL) 24 hr tablet 300 mg  300 mg Oral Daily Gonzella Lex, MD   300 mg at 04/04/16 0901  . hydrOXYzine (ATARAX/VISTARIL) tablet 50 mg  50 mg Oral QHS Hildred Priest, MD   50 mg at 04/03/16 2135  . levothyroxine (SYNTHROID, LEVOTHROID) tablet 150 mcg  150 mcg Oral QAC breakfast Gonzella Lex, MD   150 mcg at 04/04/16 0901  . magnesium hydroxide (MILK OF MAGNESIA) suspension 30 mL  30 mL Oral Daily PRN Gonzella Lex, MD        Lab Results:  No results found for this or any previous visit (from the past 48 hour(s)).  Blood Alcohol level:  Lab Results  Component Value Date    ETH <5 40/98/1191    Metabolic Disorder Labs: Lab Results  Component Value Date   HGBA1C 4.7 (L) 03/29/2016   MPG 88 03/29/2016   Lab Results  Component Value Date   PROLACTIN 77.0 (H) 03/29/2016   Lab Results  Component Value Date   CHOL 183 03/29/2016   TRIG 180 (H) 03/29/2016   HDL 32 (L) 03/29/2016   CHOLHDL 5.7 03/29/2016   VLDL 36 03/29/2016   LDLCALC 115 (H) 03/29/2016   LDLCALC 151 (H) 01/10/2014    Physical Findings: AIMS:  , ,  ,  ,    CIWA:    COWS:     Musculoskeletal: Strength & Muscle Tone: within normal limits Gait & Station: normal Patient leans: N/A  Psychiatric Specialty Exam: Physical Exam  Constitutional: He is oriented to person, place, and time. He appears well-developed and well-nourished.  HENT:  Head: Normocephalic and atraumatic.  Eyes: Conjunctivae and EOM are normal.  Neck: Normal range of motion.  Respiratory: Effort normal.  Musculoskeletal: Normal range of motion.  Neurological: He is alert and oriented to person, place, and time.    Review of Systems  Constitutional: Negative.   HENT: Negative.   Eyes: Negative.   Respiratory: Negative.   Cardiovascular: Negative.   Skin: Negative.   Neurological: Negative.   Endo/Heme/Allergies: Negative.   Psychiatric/Behavioral: Positive for depression. Negative for hallucinations, memory loss, substance abuse and suicidal ideas. The patient is not nervous/anxious and does not have insomnia.     Blood pressure 121/80, pulse 66, temperature 98 F (36.7 C), temperature source Oral, resp. rate 18, height '5\' 11"'  (1.803 m), weight 124.3 kg (274 lb), SpO2 100 %.Body mass index is 38.22 kg/m.  General Appearance: Well Groomed   Eye Contact:  Good  Speech:  Slow  Volume:  Decreased  Mood:  Euthymic  Affect:  Appropriate  Thought Process:  Linear and Descriptions of Associations: Intact  Orientation:  Full (Time, Place, and Person)  Thought Content:  Hallucinations: None  Suicidal  Thoughts:  No  Homicidal Thoughts:  No  Memory:  Immediate;   Good Recent;   Good Remote;   Good  Judgement:  Good  Insight:  Good and Shallow  Psychomotor Activity:  Decreased  Concentration:  Concentration: Fair and Attention Span: Fair  Recall:  [employer] of Knowledge:  Fair  Language:  Good  Akathisia:  no  Handed:    AIMS (if indicated):     Assets:  Chief Executive Officer Social Support  ADL's:  Intact  Cognition:  WNL  Sleep:  Number of Hours: 7.3     Treatment Plan Summary:  Patient is a 26 year old Caucasian male with history of major depressive disorder. He presented to our emergency department after an overdose on ibuprofen. This is his first suicidal attempt. Looks like this suicidal attempt was triggered by some relational problems with friends and by not being compliant with treatment or medications for depression. Patient also has history of being none consistently compliant with Synthroid which he takes for hypothyroidism  For depressive disorder patient was treated successfully in his past admission with Wellbutrin. Continue Wellbutrin which was titrated up to 300 mg over the weekend. Pt states he will continue Wellbutrin after discharge and will not stop taking meds like he did previously in 2015. Expresses he wants to get well and move forward in life.  For insomnia: Patient was not responding well to trazodone, reported added dose of Vistaril over the weekend helped him sleep better. Trazodone was discontinued, pt started on Vistaril 50 mg nightly. Pt reports Vistaril helped him more than Trazodone, reports one episode of awakening but was able to return to sleep. Will continue pt on Vistaril.  For hypothyroidism he will continue Synthroid 150 g daily.  TSH >101 due to poor compliance. Expresses he will continue to stay on Synthroid after d/c.  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 Pt not on any antipsychotics.  Will refer to f/u with PCP.  He  would benefit from endocrine evaluation.   Precautions every 15 minute checks  Diet regular  Hospitalization and status involuntary  Vital signs daily  Labs: b12 wnl  Head CT neg  Collateral information will be obtained from his parents  Chart review completed  Possible discharge in the next day or two.  Plan to have family meeting prior to discharge  Hildred Priest, MD 04/04/2016, 2:06 PM

## 2016-04-04 NOTE — Plan of Care (Signed)
Problem: Coping: Goal: Ability to verbalize feelings will improve Outcome: Progressing Patient able to discuss plans after discharge.
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
## 2016-04-04 NOTE — [facility] Suicide Risk Assessment (Signed)
[facility]   Principal Problem: Severe recurrent major depression without psychotic features [facility](HCC) Discharge Diagnoses:  Patient Active Problem List   Diagnosis Date Noted  . Severe recurrent major depression without psychotic features ([facility]) [F33.2] 03/28/2016  . Hypothyroidism [E03.9] 03/28/2016  . Ibuprofen overdose [T39.311A] 03/28/2016     Psychiatric Specialty Exam: ROS  Blood pressure 128/72, pulse 65, temperature 97.9 F (36.6 C), resp. rate 18, height 5\' 11"  (1.803 m), weight 124.3 kg (274 lb), SpO2 100 %.Body mass index is 38.22 kg/m.                                                       Mental Status Per Nursing Assessment::   On Admission:  Suicidal ideation indicated by patient, Self-harm behaviors, Self-harm thoughts, Intention to act on suicide plan  Demographic Factors:  Male and Caucasian  Loss Factors: Decrease in vocational status  Historical Factors: Family history of mental illness or substance abuse  Risk Reduction Factors:   Sense of responsibility to family, Living with another person, especially a relative and Positive social support  No access to guns  Continued Clinical Symptoms:  Previous Psychiatric Diagnoses and Treatments  Cognitive Features That Contribute To Risk:  Polarized thinking    Suicide Risk:  Minimal: No identifiable suicidal ideation.  Patients presenting with no risk factors but with morbid ruminations; may be classified as minimal risk based on the severity of the depressive symptoms  Follow-up Information    [facility]. Go on 04/07/2016.   Why:  Please arrive to your therapist appointment with Nickie Retortaniel T. Seed on March 9th @ 1:00 PM. Please bring your discharge paperwork to this appointment. If you have any questions or concerns, contact Daniel directly. Contact information: Address: [address] [place], [place] [address] Phone: [phone] Fax: [phone]  [phone]        [facility]. Go on 05/29/2016.   Why:  Please meet with your psychiatrist, Laverda Sorensonvy Settlemires on April 30th @ 9:30AM for medication management. Please bring discharge paperwork to this appointment. If you have questions or concerns, contact provider directly. Contact information: Address[address] [place], [place] [address] Phone: [phone] Fax: [phone]       WHITE, Arlyss RepressELIZABETH BURNEY, NP. Schedule an appointment as soon as possible for a visit in 1 week(s).   Specialty:  Family Medicine Contact information: [address] [place] [address] [phone]            Jimmy FootmanHernandez-Gonzalez,  Sarae Nicholes, MD 04/05/2016, 10:25 AM

## 2016-04-04 NOTE — [facility] Group Notes (Signed)
[facility] LCSW Group Therapy Note  Date/Time: 04/04/16, 0930  Type of Therapy/Topic:  Group Therapy:  Feelings about Diagnosis  Participation Level:  Active   Mood: pleasant   Description of Group:    This group will allow patients to explore their thoughts and feelings about diagnoses they have received. Patients will be guided to explore their level of understanding and acceptance of these diagnoses. Facilitator will encourage patients to process their thoughts and feelings about the reactions of others to their diagnosis, and will guide patients in identifying ways to discuss their diagnosis with significant others in their lives. This group will be process-oriented, with patients participating in exploration of their own experiences as well as giving and receiving support and challenge from other group members.   Therapeutic Goals: 1. Patient will demonstrate understanding of diagnosis as evidence by identifying two or more symptoms of the disorder:  2. Patient will be able to express two feelings regarding the diagnosis 3. Patient will demonstrate ability to communicate their needs through discussion and/or role plays  Summary of Patient Progress: Pt was able to identify with feelings regarding his MDD diagnosis that were initially shared by another group member: left out, frustrated, sad.  Pt shared that this is his second admission for depression and that his mother has been supportive throughout the process but his father has been mostly angry.  He and other group members were able to interact about how difficult it is when a family member is angry about their diagnosis.        Therapeutic Modalities:   Cognitive Behavioral Therapy Brief Therapy Feelings Identification   Daleen SquibbGreg Sabiha Sura, LCSW

## 2016-04-04 NOTE — Progress Notes (Signed)
Patient pleasant and cooperative with care. Med and group compliant. Appropriate with staff and peers. Denies SI, HI, AVH. Pt reports wanting to go home. DC orders written for tomorrow. Encouragement and support offered. Safety checks maintained. Pt receptive and remains safe on unit with q 15 min checks.

## 2016-04-05 NOTE — Progress Notes (Signed)
D: Pt denies SI/HI/AVH. Pt is pleasant and cooperative. Pt stated he was ready to leave. Pt seen interacting appropriately with peers in the dayroom  A: Pt was offered support and encouragement. Pt was given scheduled medications. Pt was encourage to attend groups. Q 15 minute checks were done for safety.   R:Pt attends groups and interacts well with peers and staff. Pt is taking medication. Pt has no complaints.Pt receptive to treatment and safety maintained on unit.

## 2016-04-05 NOTE — Progress Notes (Signed)
  [facility] :  Will you be returning to the same living situation after discharge:  Yes,  home with parents At discharge, do you have transportation home?: Yes,  family Do you have the ability to pay for your medications: Yes,  patient has insurance  Release of information consent forms completed and in the chart;  Patient's signature needed at discharge.  Patient to Follow up at: Follow-up Information    [facility]. Go on 04/07/2016.   Why:  Please arrive to your therapist appointment with Nickie Retortaniel T. Seed on March 9th @ 1:00 PM. Please bring your discharge paperwork to this appointment. If you have any questions or concerns, contact Daniel directly. Contact information: Address: [address] [place], [place] [address] Phone: [phone] Fax: [phone]        [facility]. Go on 05/29/2016.   Why:  Please meet with your psychiatrist, Laverda Sorensonvy Settlemires on April 30th @ 9:30AM for medication management. Please bring discharge paperwork to this appointment. If you have questions or concerns, contact provider directly. Contact information: Address: [address] [place], [place] [address] Phone: [phone] Fax: [phone]       WHITE, Arlyss RepressELIZABETH BURNEY, NP. Schedule an appointment as soon as possible for a visit in 1 week(s).   Specialty:  Family Medicine Contact information: [address] [place] [address] [phone]           Next level of care provider has access to [facility] Link:no  Safety Planning and Suicide Prevention discussed: Yes,  with father and patient  Have you used any form of tobacco in the last 30 days? (Cigarettes, Smokeless Tobacco, Cigars, and/or Pipes): No  Has patient been referred to the Quitline?: N/A patient is not a smoker  Patient has been referred for addiction treatment: Yes  Tahni Porchia G. Garnette CzechSampson MSW, LCSWA 04/05/2016, 8:21 AM

## 2016-04-05 NOTE — Progress Notes (Signed)
Recreation Therapy Notes  Date: 03.07.18 Time: 1:00 pm Location: Craft Room  Group Topic: Communication  Goal Area(s) Addresses:  Patient will participate in communication activity. Patient will verbalize importance of communication.  Behavioral Response: Attentive, Interactive  Intervention: I Am  Activity: Patients were paired up back-to-back and one patient was given a picture and was instructed to tell the other patient what to draw and afterwards they switched roles. Next they could sit side by side and one patient would tell the other patient what to draw and they switched roles.  Education: LRT educated patients on communication.  Education Outcome: Acknowledges education/In group clarification offered  Clinical Observations/Feedback: Patient gave directions to peer and listened to directions from peer. Patient did not contribute to group discussion.  Jacquelynn CreeGreene,Patricie Geeslin M, LRT/CTRS 04/05/2016 4:09 PM

## 2016-04-05 NOTE — [facility] Group Notes (Signed)
[facility] LCSW Group Therapy   04/05/2016  9:30 am   Type of Therapy: Group Therapy   Participation Level: Active   Participation Quality: Attentive, Sharing and Supportive   Affect: Appropriate   Cognitive: Alert and Oriented   Insight: Developing/Improving and Engaged   Engagement in Therapy: Developing/Improving and Engaged   Modes of Intervention: Clarification, Confrontation, Discussion, Education, Exploration, Limit-setting, Orientation, Problem-solving, Rapport Building, Dance movement psychotherapisteality Testing, Socialization and Support   Summary of Progress/Problems: The topic for group today was emotional regulation. This group focused on both positive and negative emotion identification and allowed  group members to process ways to identify feelings, regulate negative emotions, and find healthy ways to manage internal/external emotions. Group members were asked to reflect on a time when their reaction to an emotion led to a negative outcome and explored how alternative responses using emotion regulation would have benefited them. Group members were also asked to discuss a time when emotion regulation was utilized when a negative emotion was experienced. Patient identified emotions that led to hospitalization as feelings of hoplessness. To regulate that negative emotion, he stated coping mechanisms such as being around loved ones and engaging in therapy.     Joe Haley, MSW, LCSWA 04/05/2016, 11:22 AM

## 2016-04-05 NOTE — Progress Notes (Signed)
Recreation Therapy Notes  INPATIENT RECREATION TR PLAN  Patient Details Name: Joe Haley MRN: [id number] DOB: 06-14-1990 Today's Date: 04/05/2016  Rec Therapy Plan Is patient appropriate for Therapeutic Recreation?: Yes Treatment times per week: At least once a week TR Treatment/Interventions: 1:1 session, Group participation (Comment) (Appropriate participation in daily recreational therapy tx)  Discharge Criteria Pt will be discharged from therapy if:: Treatment goals are met, Discharged Treatment plan/goals/alternatives discussed and agreed upon by:: Patient/family  Discharge Summary Short term goals set: See Care Plan Short term goals met: Complete Progress toward goals comments: One-to-one attended Which groups?: Self-esteem, Communication, Social skills, Leisure education, Other (Comment) (Self-expression) One-to-one attended: Self-esteem, stress management Reason goals not met: N/A Therapeutic equipment acquired: None Reason patient discharged from therapy: Discharge from hospital Pt/family agrees with progress & goals achieved: Yes Date patient discharged from therapy: 04/05/16   Leonette Monarch, LRT/CTRS 04/05/2016, 4:16 PM

## 2016-04-05 NOTE — Plan of Care (Signed)
Problem: [facility] Participation in Recreation Therapeutic Interventions Goal: STG-Patient will demonstrate improved self esteem by identif STG: Self-Esteem - Within 4 treatment sessions, patient verbalize at least 5 positive affirmation statements in each of 2 treatment sessions to increase self-esteem.  Outcome: Completed/Met Date Met: 04/05/16 Treatment Session 2; Completed 2 out of 2: At approximately 8:30 am, LRT met with patient in consultation room. Patient verbalized 5 positive affirmation statements. Patient reported it felt "good". LRT encouraged patient to continue saying positive affirmation statements.  Leonette Monarch, LRT/CTRS 03.07.18 10:13 am Goal: STG-Other Recreation Therapy Goal (Specify) STG: Stress Management - Within 4 treatment sessions, patient will verbalize understanding of the stress management techniques in each of 2 treatment sessions to increase stress management skills.  Outcome: Completed/Met Date Met: 04/05/16 Treatment Session 2; Completed 2 out of 2: At approximately 8:30 am, LRT met with patient in consultation room. Patient reported he read over and practiced the stress management techniques. Patient verbalized understanding and reported the techniques were helpful. LRT encouraged patient to continue practicing the stress management techniques.  Leonette Monarch, LRT/CTRS 03.07.18 10:14 am

## 2016-04-05 NOTE — Tx Team (Signed)
Interdisciplinary Treatment and Diagnostic Plan Update  04/05/2016 Time of Session: 11:00am Joe Haley Joe Haley MRN: [id number]  Principal Diagnosis: Severe recurrent major depression without psychotic features [facility](HCC)  Secondary Diagnoses: Principal Problem:   Severe recurrent major depression without psychotic features (HCC) Active Problems:   Hypothyroidism   Ibuprofen overdose   Current Medications:  Current Facility-Administered Medications  Medication Dose Route Frequency Provider Last Rate Last Dose  . acetaminophen (TYLENOL) tablet 650 mg  650 mg Oral Q6H PRN Audery AmelJohn T Clapacs, MD      . alum & mag hydroxide-simeth (MAALOX/MYLANTA) 200-200-20 MG/5ML suspension 30 mL  30 mL Oral Q4H PRN Audery AmelJohn T Clapacs, MD      . buPROPion (WELLBUTRIN XL) 24 hr tablet 300 mg  300 mg Oral Daily Audery AmelJohn T Clapacs, MD   300 mg at 04/05/16 0825  . hydrOXYzine (ATARAX/VISTARIL) tablet 50 mg  50 mg Oral QHS Jimmy FootmanAndrea Hernandez-Gonzalez, MD   50 mg at 04/04/16 2121  . levothyroxine (SYNTHROID, LEVOTHROID) tablet 150 mcg  150 mcg Oral QAC breakfast Audery AmelJohn T Clapacs, MD   150 mcg at 04/05/16 0825  . magnesium hydroxide (MILK OF MAGNESIA) suspension 30 mL  30 mL Oral Daily PRN Audery AmelJohn T Clapacs, MD       PTA Medications: Prescriptions Prior to Admission  Medication Sig Dispense Refill Last Dose  . levothyroxine (SYNTHROID, LEVOTHROID) 150 MCG tablet Take 150 mcg by mouth daily.   Unknown at Unknown    Patient Stressors: Financial difficulties Loss of relationship Occupational concerns  Patient Strengths: Manufacturing systems engineerCommunication skills Supportive family/friends  Treatment Modalities: Medication Management, Group therapy, Case management,  1 to 1 session with clinician, Psychoeducation, Recreational therapy.   Physician Treatment Plan for Primary Diagnosis: Severe recurrent major depression without psychotic features (HCC) Long Term Goal(s): Improvement in symptoms so as ready for discharge Improvement in symptoms so as  ready for discharge   Short Term Goals: Ability to identify changes in lifestyle to reduce recurrence of condition will improve Ability to verbalize feelings will improve Ability to disclose and discuss suicidal ideas Compliance with prescribed medications will improve Ability to demonstrate self-control will improve Compliance with prescribed medications will improve Ability to identify triggers associated with substance abuse/mental health issues will improve  Medication Management: Evaluate patient's response, side effects, and tolerance of medication regimen.  Therapeutic Interventions: 1 to 1 sessions, Unit Group sessions and Medication administration.  Evaluation of Outcomes: Adequate for Discharge  Physician Treatment Plan for Secondary Diagnosis: Principal Problem:   Severe recurrent major depression without psychotic features (HCC) Active Problems:   Hypothyroidism   Ibuprofen overdose  Long Term Goal(s): Improvement in symptoms so as ready for discharge Improvement in symptoms so as ready for discharge   Short Term Goals: Ability to identify changes in lifestyle to reduce recurrence of condition will improve Ability to verbalize feelings will improve Ability to disclose and discuss suicidal ideas Compliance with prescribed medications will improve Ability to demonstrate self-control will improve Compliance with prescribed medications will improve Ability to identify triggers associated with substance abuse/mental health issues will improve     Medication Management: Evaluate patient's response, side effects, and tolerance of medication regimen.  Therapeutic Interventions: 1 to 1 sessions, Unit Group sessions and Medication administration.  Evaluation of Outcomes: Adequate for Discharge   RN Treatment Plan for Primary Diagnosis: Severe recurrent major depression without psychotic features (HCC) Long Term Goal(s): Knowledge of disease and therapeutic regimen to  maintain health will improve  Short Term Goals: Ability to remain free  from injury will improve, Ability to demonstrate self-control and Ability to disclose and discuss suicidal ideas  Medication Management: RN will administer medications as ordered by provider, will assess and evaluate patient's response and provide education to patient for prescribed medication. RN will report any adverse and/or side effects to prescribing provider.  Therapeutic Interventions: 1 on 1 counseling sessions, Psychoeducation, Medication administration, Evaluate responses to treatment, Monitor vital signs and CBGs as ordered, Perform/monitor CIWA, COWS, AIMS and Fall Risk screenings as ordered, Perform wound care treatments as ordered.  Evaluation of Outcomes: Adequate for Discharge   LCSW Treatment Plan for Primary Diagnosis: Severe recurrent major depression without psychotic features (HCC) Long Term Goal(s): Safe transition to appropriate next level of care at discharge, Engage patient in therapeutic group addressing interpersonal concerns.  Short Term Goals: Engage patient in aftercare planning with referrals and resources, Increase social support, Increase ability to appropriately verbalize feelings and Increase emotional regulation  Therapeutic Interventions: Assess for all discharge needs, 1 to 1 time with Social worker, Explore available resources and support systems, Assess for adequacy in community support network, Educate family and significant other(s) on suicide prevention, Complete Psychosocial Assessment, Interpersonal group therapy.  Evaluation of Outcomes: Adequate for Discharge   Progress in Treatment: Attending groups: Yes. Participating in groups: Yes. Taking medication as prescribed: Yes. Toleration medication: Yes. Family/Significant other contact made: Yes, individual(s) contacted:  father Patient understands diagnosis: Yes. Discussing patient identified problems/goals with staff:  Yes. Medical problems stabilized or resolved: Yes. Denies suicidal/homicidal ideation: Yes. Issues/concerns per patient self-inventory: No. Other: n/a  New problem(s) identified: None identified at this time.  New Short Term/Long Term Goal(s): None identified at this time.   Discharge Plan or Barriers: Patient will discharge home and follow-up with outpatient services for medication management and therapy.   Reason for Continuation of Hospitalization: Anticipated discharge 04/05/2016  Estimated Length of Stay: Anticipated discharge 04/05/2016  Attendees: Patient: Joe Haley 04/05/2016 8:42 AM  Physician: Dr. Radene JourneyJayme Cloud, MD 04/05/2016 8:42 AM  Nursing: Shelia Media, RN 04/05/2016 8:42 AM  RN Care Manager: 04/05/2016 8:42 AM  Social Worker: Fredrich Birks. Garnette Czech MSW, LCSWA 04/05/2016 8:42 AM  Recreational Therapist: Jacquelynn Cree, LRT/CTRS 04/05/2016 8:42 AM  Other:  04/05/2016 8:42 AM  Other:  04/05/2016 8:42 AM  Other: 04/05/2016 8:42 AM    Scribe for Treatment Team: Arelia Longest, LCSWA 04/05/2016 11:11 AM

## 2016-04-05 NOTE — Progress Notes (Signed)
Patient discharged home. DC instructions provided and explained. Medications reviewed. Rx given. All questions answered. Denies SI, HI, AVH. Belongings returned. AVS, transition and risk assessment given to patient. Pt stable at discharge.

## 2017-05-25 ENCOUNTER — Encounter: Payer: Self-pay | Admitting: Family Medicine

## 2017-05-25 ENCOUNTER — Ambulatory Visit (INDEPENDENT_AMBULATORY_CARE_PROVIDER_SITE_OTHER): Payer: Medicaid Other | Admitting: Family Medicine

## 2017-05-25 VITALS — BP 119/75 | HR 98 | Temp 98.4°F | Ht 71.0 in | Wt 335.0 lb

## 2017-05-25 DIAGNOSIS — H5711 Ocular pain, right eye: Secondary | ICD-10-CM | POA: Diagnosis not present

## 2017-05-25 DIAGNOSIS — Z7689 Persons encountering health services in other specified circumstances: Secondary | ICD-10-CM

## 2017-05-25 DIAGNOSIS — F332 Major depressive disorder, recurrent severe without psychotic features: Secondary | ICD-10-CM

## 2017-05-25 DIAGNOSIS — E039 Hypothyroidism, unspecified: Secondary | ICD-10-CM

## 2017-05-25 MED ORDER — BUPROPION HCL ER (XL) 300 MG PO TB24: 300.0000 mg | ORAL_TABLET | Freq: Every day | ORAL | 1 refills | Status: DC

## 2017-05-25 NOTE — Assessment & Plan Note (Signed)
Will schedule CPE within the next month to recheck TSH. Asymptomatic, continue current regimen

## 2017-05-25 NOTE — Progress Notes (Signed)
BP 119/75 (BP Location: Right Arm, Patient Position: Sitting, Cuff Size: Normal)   Pulse 98   Temp 98.4 F (36.9 C) (Oral)   Ht 5\' 11"  (1.803 m)   Wt (!) 335 lb (152 kg)   SpO2 95%   BMI 46.72 kg/m    Subjective:    Patient ID: Joe Haley, male    DOB: Sep 17, 1990, 27 y.o.   MRN: [id number]  HPI: Joe Haley is a 27 y.o. male  Chief Complaint  Patient presents with  . Establish Care    Need new PCP. Insurance changed.  . Eye Problem    Ongoing for 5 months. Constantly irritated and burning. Denies itching.   Pt here today to establish care. Hx of depression and hypothyroidism, which have been stable on current regimen. Has had SI in the past as well as an ibuprofen overdose. No current SI/HI, severe mood swings.   5 months of right eye burning, irritation, and discharge. Was given antibiotic ointment at an UC which provided temporary comfort when applying but did not fix the issue. Sometimes gets blurred vision when it's very irritated. Denies eye redness, HAs, N/V. Has not seen an eye doctor in a long time.   Last CPE over a year ago.  Relevant past medical, surgical, family and social history reviewed and updated as indicated. Interim medical history since our last visit reviewed. Allergies and medications reviewed and updated.  Review of Systems  Per HPI unless specifically indicated above     Objective:    BP 119/75 (BP Location: Right Arm, Patient Position: Sitting, Cuff Size: Normal)   Pulse 98   Temp 98.4 F (36.9 C) (Oral)   Ht 5\' 11"  (1.803 m)   Wt (!) 335 lb (152 kg)   SpO2 95%   BMI 46.72 kg/m   Wt Readings from Last 3 Encounters:  05/25/17 (!) 335 lb (152 kg)  03/27/16 280 lb (127 kg)    Physical Exam  Constitutional: He is oriented to person, place, and time. He appears well-developed and well-nourished. No distress.  HENT:  Head: Atraumatic.  Nose: Nose normal.  Mouth/Throat: Oropharynx is clear and moist.  Eyes: Pupils are  equal, round, and reactive to light. Conjunctivae and EOM are normal. Right eye exhibits no discharge. Left eye exhibits no discharge.  Right eye well appearing, no outward signs of abnormality  Neck: Normal range of motion. Neck supple.  Cardiovascular: Normal rate, regular rhythm and normal heart sounds.  Pulmonary/Chest: Effort normal and breath sounds normal.  Musculoskeletal: Normal range of motion.  Neurological: He is alert and oriented to person, place, and time.  Skin: Skin is warm and dry.  Psychiatric: He has a normal mood and affect. His behavior is normal.  Nursing note and vitals reviewed.  Visual Acuity: uncorrected Left eye - 20/25 Right eye - 20/70 Both - 20/20  Results for orders placed or performed during the hospital encounter of 03/27/16  Comprehensive metabolic panel  Result Value Ref Range   Sodium 141 135 - 145 mmol/L   Potassium 4.2 3.5 - 5.1 mmol/L   Chloride 107 101 - 111 mmol/L   CO2 22 22 - 32 mmol/L   Glucose, Bld 83 65 - 99 mg/dL   BUN 13 6 - 20 mg/dL   Creatinine, Ser 4.09 0.61 - 1.24 mg/dL   Calcium 8.6 (L) 8.9 - 10.3 mg/dL   Total Protein 7.6 6.5 - 8.1 g/dL   Albumin 4.7 3.5 - 5.0 g/dL  AST 23 15 - 41 U/L   ALT 22 17 - 63 U/L   Alkaline Phosphatase 60 38 - 126 U/L   Total Bilirubin 0.6 0.3 - 1.2 mg/dL   GFR calc non Af Amer >60 >60 mL/min   GFR calc Af Amer >60 >60 mL/min   Anion gap 12 5 - 15  Ethanol  Result Value Ref Range   Alcohol, Ethyl (B) <5 <5 mg/dL  Salicylate level  Result Value Ref Range   Salicylate Lvl <7.0 2.8 - 30.0 mg/dL  Acetaminophen level  Result Value Ref Range   Acetaminophen (Tylenol), Serum <10 (L) 10 - 30 ug/mL  cbc  Result Value Ref Range   WBC 13.8 (H) 3.8 - 10.6 K/uL   RBC 5.36 4.40 - 5.90 MIL/uL   Hemoglobin 16.1 13.0 - 18.0 g/dL   HCT 13.047.2 86.540.0 - 78.452.0 %   MCV 88.0 80.0 - 100.0 fL   MCH 30.1 26.0 - 34.0 pg   MCHC 34.2 32.0 - 36.0 g/dL   RDW 69.614.7 (H) 29.511.5 - 28.414.5 %   Platelets 262 150 - 440 K/uL    Urine Drug Screen, Qualitative  Result Value Ref Range   Tricyclic, Ur Screen NONE DETECTED NONE DETECTED   Amphetamines, Ur Screen NONE DETECTED NONE DETECTED   MDMA (Ecstasy)Ur Screen NONE DETECTED NONE DETECTED   Cocaine Metabolite,Ur [place] NONE DETECTED NONE DETECTED   Opiate, Ur Screen NONE DETECTED NONE DETECTED   Phencyclidine (PCP) Ur S NONE DETECTED NONE DETECTED   Cannabinoid 50 Ng, Ur [place] NONE DETECTED NONE DETECTED   Barbiturates, Ur Screen NONE DETECTED NONE DETECTED   Benzodiazepine, Ur Scrn NONE DETECTED NONE DETECTED   Methadone Scn, Ur NONE DETECTED NONE DETECTED  Lipase, blood  Result Value Ref Range   Lipase 28 11 - 51 U/L  Lactic acid, plasma  Result Value Ref Range   Lactic Acid, Venous 1.3 0.5 - 1.9 mmol/L  Urinalysis, Complete w Microscopic  Result Value Ref Range   Color, Urine STRAW (A) YELLOW   APPearance CLEAR (A) CLEAR   Specific Gravity, Urine 1.016 1.005 - 1.030   pH 5.0 5.0 - 8.0   Glucose, UA NEGATIVE NEGATIVE mg/dL   Hgb urine dipstick NEGATIVE NEGATIVE   Bilirubin Urine NEGATIVE NEGATIVE   Ketones, ur NEGATIVE NEGATIVE mg/dL   Protein, ur 30 (A) NEGATIVE mg/dL   Nitrite NEGATIVE NEGATIVE   Leukocytes, UA NEGATIVE NEGATIVE   RBC / HPF 0-5 0 - 5 RBC/hpf   WBC, UA 6-30 0 - 5 WBC/hpf   Bacteria, UA RARE (A) NONE SEEN   Squamous Epithelial / LPF 0-5 (A) NONE SEEN   Mucus PRESENT   Troponin I  Result Value Ref Range   Troponin I <0.03 <0.03 ng/mL  Comprehensive metabolic panel  Result Value Ref Range   Sodium 141 135 - 145 mmol/L   Potassium 3.9 3.5 - 5.1 mmol/L   Chloride 111 101 - 111 mmol/L   CO2 19 (L) 22 - 32 mmol/L   Glucose, Bld 79 65 - 99 mg/dL   BUN 12 6 - 20 mg/dL   Creatinine, Ser 1.320.85 0.61 - 1.24 mg/dL   Calcium 8.0 (L) 8.9 - 10.3 mg/dL   Total Protein 6.5 6.5 - 8.1 g/dL   Albumin 4.0 3.5 - 5.0 g/dL   AST 24 15 - 41 U/L   ALT 19 17 - 63 U/L   Alkaline Phosphatase 49 38 - 126 U/L   Total Bilirubin 0.5 0.3 - 1.2  mg/dL   GFR  calc non Af Amer >60 >60 mL/min   GFR calc Af Amer >60 >60 mL/min   Anion gap 11 5 - 15      Assessment & Plan:   Problem List Items Addressed This Visit      Endocrine   Hypothyroidism    Will schedule CPE within the next month to recheck TSH. Asymptomatic, continue current regimen        Other   Severe recurrent major depression without psychotic features (HCC)    Stable and under good control with prozac and wellbutrin. Continue current regimen. No SI/HI.       Relevant Medications   FLUoxetine (PROZAC) 20 MG tablet   buPROPion (WELLBUTRIN XL) 300 MG 24 hr tablet    Other Visit Diagnoses    Pain of right eye    -  Primary   Try lubricating drops and allergy drops, referral placed to opthalmology for further evaluation given chronicity.    Relevant Orders   Ambulatory referral to Ophthalmology   Encounter to establish care           Follow up plan: Return for CPE.

## 2017-05-25 NOTE — Patient Instructions (Signed)
Follow up for CPE 

## 2017-05-25 NOTE — Assessment & Plan Note (Signed)
Stable and under good control with prozac and wellbutrin. Continue current regimen. No SI/HI.

## 2017-06-22 ENCOUNTER — Encounter: Payer: Medicaid Other | Admitting: Family Medicine

## 2017-07-26 ENCOUNTER — Encounter: Payer: Self-pay | Admitting: Family Medicine

## 2017-07-26 ENCOUNTER — Ambulatory Visit (INDEPENDENT_AMBULATORY_CARE_PROVIDER_SITE_OTHER): Payer: Medicaid Other | Admitting: Family Medicine

## 2017-07-26 VITALS — BP 122/84 | HR 88 | Ht 69.0 in | Wt 334.0 lb

## 2017-07-26 DIAGNOSIS — F332 Major depressive disorder, recurrent severe without psychotic features: Secondary | ICD-10-CM

## 2017-07-26 DIAGNOSIS — Z0000 Encounter for general adult medical examination without abnormal findings: Secondary | ICD-10-CM | POA: Diagnosis not present

## 2017-07-26 DIAGNOSIS — Z114 Encounter for screening for human immunodeficiency virus [HIV]: Secondary | ICD-10-CM

## 2017-07-26 DIAGNOSIS — L83 Acanthosis nigricans: Secondary | ICD-10-CM

## 2017-07-26 DIAGNOSIS — E039 Hypothyroidism, unspecified: Secondary | ICD-10-CM

## 2017-07-26 DIAGNOSIS — E782 Mixed hyperlipidemia: Secondary | ICD-10-CM

## 2017-07-26 LAB — UA/M W/RFLX CULTURE, ROUTINE
Bilirubin, UA: NEGATIVE
Glucose, UA: NEGATIVE
KETONES UA: NEGATIVE
Leukocytes, UA: NEGATIVE
NITRITE UA: NEGATIVE
PH UA: 5.5 (ref 5.0–7.5)
Protein, UA: NEGATIVE
Urobilinogen, Ur: 1 mg/dL (ref 0.2–1.0)

## 2017-07-26 LAB — MICROSCOPIC EXAMINATION: BACTERIA UA: NONE SEEN

## 2017-07-26 LAB — BAYER DCA HB A1C WAIVED: HB A1C (BAYER DCA - WAIVED): 4.8 % (ref <7.0)

## 2017-07-26 MED ORDER — ARIPIPRAZOLE 2 MG PO TABS: 2.0000 mg | ORAL_TABLET | Freq: Every day | ORAL | 1 refills | Status: DC

## 2017-07-26 MED ORDER — FLUOXETINE HCL 20 MG PO TABS: 20.0000 mg | ORAL_TABLET | Freq: Every day | ORAL | 1 refills | Status: DC

## 2017-07-26 NOTE — Assessment & Plan Note (Signed)
Will check A1c. Await results.

## 2017-07-26 NOTE — Assessment & Plan Note (Addendum)
Not under good control. Not taking his abilify or fluoxetine. Will restart them. Continue wellbutrin. Very irritable. Recheck 1 month.

## 2017-07-26 NOTE — Progress Notes (Signed)
BP 122/84   Pulse 88   Ht 5\' 9"  (1.753 m)   Wt (!) 334 lb (151.5 kg)   SpO2 98%   BMI 49.32 kg/m    Subjective:    Patient ID: Joe Haley, male    DOB: [birth year], 27 y.o.   MRN: [id number]  HPI: Joe Haley is a 27 y.o. male presenting on 07/26/2017 for comprehensive medical examination. Current medical complaints include:  HYPOTHYROIDISM Thyroid control status:stable Satisfied with current treatment? yes Medication side effects: no Medication compliance: good compliance Recent dose adjustment:no Fatigue: yes Cold intolerance: no Heat intolerance: no Weight gain: yes Weight loss: no Constipation: no Diarrhea/loose stools: no Palpitations: no Lower extremity edema: no Anxiety/depressed mood: yes  Interim Problems from his last visit: no  Depression Screen done today and results listed below:  Depression screen [facility] 2/9 07/26/2017  Decreased Interest 2  Down, Depressed, Hopeless 2  PHQ - 2 Score 4  Altered sleeping 2  Tired, decreased energy 2  Change in appetite 2  Feeling bad or failure about yourself  2  Trouble concentrating 2  Moving slowly or fidgety/restless 2  Suicidal thoughts 2  PHQ-9 Score 18  Difficult doing work/chores Very difficult   GAD 7 : Generalized Anxiety Score 07/26/2017  Nervous, Anxious, on Edge 2  Control/stop worrying 1  Worry too much - different things 2  Trouble relaxing 2  Restless 1  Easily annoyed or irritable 2  Afraid - awful might happen 2  Total GAD 7 Score 12  Anxiety Difficulty Very difficult    Past Medical History:  Past Medical History:  Diagnosis Date  . Acanthosis nigricans   . Hyperlipidemia   . Hypothyroidism   . Ibuprofen overdose   . Major depressive disorder   . Obesity   . Restless leg syndrome     Surgical History:  History reviewed. No pertinent surgical history.  Medications:  Current Outpatient Medications on File Prior to Visit  Medication Sig  . buPROPion (WELLBUTRIN XL)  300 MG 24 hr tablet Take 1 tablet (300 mg total) by mouth daily.  Marland Kitchen levothyroxine (SYNTHROID, LEVOTHROID) 150 MCG tablet Take 150 mcg by mouth daily.   No current facility-administered medications on file prior to visit.     Allergies:  No Known Allergies  Social History:  Social History   Socioeconomic History  . Marital status: Single    Spouse name: Not on file  . Number of children: Not on file  . Years of education: Not on file  . Highest education level: Not on file  Occupational History  . Not on file  Social Needs  . Financial resource strain: Not on file  . Food insecurity:    Worry: Not on file    Inability: Not on file  . Transportation needs:    Medical: Not on file    Non-medical: Not on file  Tobacco Use  . Smoking status: Never Smoker  . Smokeless tobacco: Never Used  Substance and Sexual Activity  . Alcohol use: No  . Drug use: No  . Sexual activity: Not Currently  Lifestyle  . Physical activity:    Days per week: Not on file    Minutes per session: Not on file  . Stress: Not on file  Relationships  . Social connections:    Talks on phone: Not on file    Gets together: Not on file    Attends religious service: Not on file    Active member  of club or organization: Not on file    Attends meetings of clubs or organizations: Not on file    Relationship status: Not on file  . Intimate partner violence:    Fear of current or ex partner: Not on file    Emotionally abused: Not on file    Physically abused: Not on file    Forced sexual activity: Not on file  Other Topics Concern  . Not on file  Social History Narrative  . Not on file   Social History   Tobacco Use  Smoking Status Never Smoker  Smokeless Tobacco Never Used   Social History   Substance and Sexual Activity  Alcohol Use No    Family History:  Family History  Problem Relation Age of Onset  . Depression Sister   . Depression Maternal Grandmother   . Diabetes Maternal  Grandmother     Past medical history, surgical history, medications, allergies, family history and social history reviewed with patient today and changes made to appropriate areas of the chart.   Review of Systems  Constitutional: Negative.   HENT: Negative.   Eyes: Positive for blurred vision. Negative for double vision, photophobia, pain, discharge and redness.  Respiratory: Negative.   Cardiovascular: Negative.   Gastrointestinal: Negative.   Genitourinary: Negative.   Musculoskeletal: Negative.   Skin: Negative.   Neurological: Negative.   Endo/Heme/Allergies: Negative.   Psychiatric/Behavioral: Positive for depression. Negative for hallucinations, memory loss, substance abuse and suicidal ideas. The patient is not nervous/anxious and does not have insomnia.        Irritiablity    All other ROS negative except what is listed above and in the HPI.      Objective:    BP 122/84   Pulse 88   Ht 5\' 9"  (1.753 m)   Wt (!) 334 lb (151.5 kg)   SpO2 98%   BMI 49.32 kg/m   Wt Readings from Last 3 Encounters:  07/26/17 (!) 334 lb (151.5 kg)  05/25/17 (!) 335 lb (152 kg)  03/27/16 280 lb (127 kg)    Physical Exam  Constitutional: He is oriented to person, place, and time. He appears well-developed and well-nourished. No distress.  HENT:  Head: Normocephalic and atraumatic.  Right Ear: Hearing, tympanic membrane, external ear and ear canal normal.  Left Ear: Hearing, tympanic membrane, external ear and ear canal normal.  Nose: Nose normal.  Mouth/Throat: Uvula is midline, oropharynx is clear and moist and mucous membranes are normal. No oropharyngeal exudate.  Eyes: Pupils are equal, round, and reactive to light. Conjunctivae, EOM and lids are normal. Right eye exhibits no discharge. Left eye exhibits no discharge. No scleral icterus.  Neck: Normal range of motion. Neck supple. No JVD present. No tracheal deviation present. No thyromegaly present.  Cardiovascular: Normal rate,  regular rhythm, normal heart sounds and intact distal pulses. Exam reveals no gallop and no friction rub.  No murmur heard. Pulmonary/Chest: Effort normal and breath sounds normal. No stridor. No respiratory distress. He has no wheezes. He has no rales. He exhibits no tenderness.  Abdominal: Soft. Bowel sounds are normal. He exhibits no distension and no mass. There is no tenderness. There is no rebound and no guarding. No hernia. Hernia confirmed negative in the right inguinal area and confirmed negative in the left inguinal area.  Genitourinary: Testes normal and penis normal. Cremasteric reflex is present. Right testis shows no mass, no swelling and no tenderness. Right testis is descended. Cremasteric reflex is not absent  on the right side. Left testis shows no mass, no swelling and no tenderness. Left testis is descended. Cremasteric reflex is not absent on the left side. Circumcised. No phimosis, paraphimosis, hypospadias, penile erythema or penile tenderness. No discharge found.  Musculoskeletal: Normal range of motion. He exhibits no edema, tenderness or deformity.  Lymphadenopathy:    He has no cervical adenopathy.  Neurological: He is alert and oriented to person, place, and time. He displays normal reflexes. No cranial nerve deficit or sensory deficit. He exhibits normal muscle tone. Coordination normal.  Skin: Skin is warm, dry and intact. Capillary refill takes less than 2 seconds. No rash noted. He is not diaphoretic. No erythema. No pallor.  Psychiatric: He has a normal mood and affect. His speech is normal and behavior is normal. Judgment and thought content normal. Cognition and memory are normal.  Nursing note and vitals reviewed.   Results for orders placed or performed during the hospital encounter of 03/27/16  Comprehensive metabolic panel  Result Value Ref Range   Sodium 141 135 - 145 mmol/L   Potassium 4.2 3.5 - 5.1 mmol/L   Chloride 107 101 - 111 mmol/L   CO2 22 22 - 32  mmol/L   Glucose, Bld 83 65 - 99 mg/dL   BUN 13 6 - 20 mg/dL   Creatinine, Ser 4.09 0.61 - 1.24 mg/dL   Calcium 8.6 (L) 8.9 - 10.3 mg/dL   Total Protein 7.6 6.5 - 8.1 g/dL   Albumin 4.7 3.5 - 5.0 g/dL   AST 23 15 - 41 U/L   ALT 22 17 - 63 U/L   Alkaline Phosphatase 60 38 - 126 U/L   Total Bilirubin 0.6 0.3 - 1.2 mg/dL   GFR calc non Af Amer >60 >60 mL/min   GFR calc Af Amer >60 >60 mL/min   Anion gap 12 5 - 15  Ethanol  Result Value Ref Range   Alcohol, Ethyl (B) <5 <5 mg/dL  Salicylate level  Result Value Ref Range   Salicylate Lvl <7.0 2.8 - 30.0 mg/dL  Acetaminophen level  Result Value Ref Range   Acetaminophen (Tylenol), Serum <10 (L) 10 - 30 ug/mL  cbc  Result Value Ref Range   WBC 13.8 (H) 3.8 - 10.6 K/uL   RBC 5.36 4.40 - 5.90 MIL/uL   Hemoglobin 16.1 13.0 - 18.0 g/dL   HCT 81.1 91.4 - 78.2 %   MCV 88.0 80.0 - 100.0 fL   MCH 30.1 26.0 - 34.0 pg   MCHC 34.2 32.0 - 36.0 g/dL   RDW 95.6 (H) 21.3 - 08.6 %   Platelets 262 150 - 440 K/uL  Urine Drug Screen, Qualitative  Result Value Ref Range   Tricyclic, Ur Screen NONE DETECTED NONE DETECTED   Amphetamines, Ur Screen NONE DETECTED NONE DETECTED   MDMA (Ecstasy)Ur Screen NONE DETECTED NONE DETECTED   Cocaine Metabolite,Ur [place] NONE DETECTED NONE DETECTED   Opiate, Ur Screen NONE DETECTED NONE DETECTED   Phencyclidine (PCP) Ur S NONE DETECTED NONE DETECTED   Cannabinoid 50 Ng, Ur [place] NONE DETECTED NONE DETECTED   Barbiturates, Ur Screen NONE DETECTED NONE DETECTED   Benzodiazepine, Ur Scrn NONE DETECTED NONE DETECTED   Methadone Scn, Ur NONE DETECTED NONE DETECTED  Lipase, blood  Result Value Ref Range   Lipase 28 11 - 51 U/L  Lactic acid, plasma  Result Value Ref Range   Lactic Acid, Venous 1.3 0.5 - 1.9 mmol/L  Urinalysis, Complete w Microscopic  Result Value Ref  Range   Color, Urine STRAW (A) YELLOW   APPearance CLEAR (A) CLEAR   Specific Gravity, Urine 1.016 1.005 - 1.030   pH 5.0 5.0 - 8.0   Glucose, UA  NEGATIVE NEGATIVE mg/dL   Hgb urine dipstick NEGATIVE NEGATIVE   Bilirubin Urine NEGATIVE NEGATIVE   Ketones, ur NEGATIVE NEGATIVE mg/dL   Protein, ur 30 (A) NEGATIVE mg/dL   Nitrite NEGATIVE NEGATIVE   Leukocytes, UA NEGATIVE NEGATIVE   RBC / HPF 0-5 0 - 5 RBC/hpf   WBC, UA 6-30 0 - 5 WBC/hpf   Bacteria, UA RARE (A) NONE SEEN   Squamous Epithelial / LPF 0-5 (A) NONE SEEN   Mucus PRESENT   Troponin I  Result Value Ref Range   Troponin I <0.03 <0.03 ng/mL  Comprehensive metabolic panel  Result Value Ref Range   Sodium 141 135 - 145 mmol/L   Potassium 3.9 3.5 - 5.1 mmol/L   Chloride 111 101 - 111 mmol/L   CO2 19 (L) 22 - 32 mmol/L   Glucose, Bld 79 65 - 99 mg/dL   BUN 12 6 - 20 mg/dL   Creatinine, Ser 1.61 0.61 - 1.24 mg/dL   Calcium 8.0 (L) 8.9 - 10.3 mg/dL   Total Protein 6.5 6.5 - 8.1 g/dL   Albumin 4.0 3.5 - 5.0 g/dL   AST 24 15 - 41 U/L   ALT 19 17 - 63 U/L   Alkaline Phosphatase 49 38 - 126 U/L   Total Bilirubin 0.5 0.3 - 1.2 mg/dL   GFR calc non Af Amer >60 >60 mL/min   GFR calc Af Amer >60 >60 mL/min   Anion gap 11 5 - 15      Assessment & Plan:   Problem List Items Addressed This Visit      Endocrine   Acquired hypothyroidism    Rechecking levels today. Await results. Call with any concerns.       Relevant Orders   CBC with Differential/Platelet   Comprehensive metabolic panel   TSH     Musculoskeletal and Integument   Acanthosis nigricans    Will check A1c. Await results.       Relevant Orders   Comprehensive metabolic panel   Lipid Panel w/o Chol/HDL Ratio   UA/M w/rflx Culture, Routine   Bayer DCA Hb A1c Waived     Other   Severe recurrent major depression without psychotic features (HCC)    Not under good control. Not taking his abilify or fluoxetine. Will restart them. Continue wellbutrin. Very irritable. Recheck 1 month.       Relevant Medications   FLUoxetine (PROZAC) 20 MG tablet   Hyperlipidemia    Recheck labs today. Await results.        Morbid obesity (HCC)    Encouraged slow, steady weight loss of 1-2lbs a week. Call with any concerns.        Other Visit Diagnoses    Routine general medical examination at a health care facility    -  Primary   Vaccines declined. Screening labs checked today. Continue diet and exercise. Call with any concerns.    Relevant Orders   CBC with Differential/Platelet   Comprehensive metabolic panel   Lipid Panel w/o Chol/HDL Ratio   TSH   UA/M w/rflx Culture, Routine   Bayer DCA Hb A1c Waived   Screening for HIV (human immunodeficiency virus)       Labs drawn today. Await results.    Relevant Orders   HIV antibody  LABORATORY TESTING:  Health maintenance labs ordered today as discussed above.   IMMUNIZATIONS:   - Tdap: Tetanus vaccination status reviewed: last tetanus booster within 10 years. - Influenza: Postponed to flu season - Pneumovax: Not applicable - HPV: Refused  PATIENT COUNSELING:    Sexuality: Discussed sexually transmitted diseases, partner selection, use of condoms, avoidance of unintended pregnancy  and contraceptive alternatives.   Advised to avoid cigarette smoking.  I discussed with the patient that most people either abstain from alcohol or drink within safe limits (<=14/week and <=4 drinks/occasion for males, <=7/weeks and <= 3 drinks/occasion for females) and that the risk for alcohol disorders and other health effects rises proportionally with the number of drinks per week and how often a drinker exceeds daily limits.  Discussed cessation/primary prevention of drug use and availability of treatment for abuse.   Diet: Encouraged to adjust caloric intake to maintain  or achieve ideal body weight, to reduce intake of dietary saturated fat and total fat, to limit sodium intake by avoiding high sodium foods and not adding table salt, and to maintain adequate dietary potassium and calcium preferably from fresh fruits, vegetables, and low-fat dairy  products.    stressed the importance of regular exercise  Injury prevention: Discussed safety belts, safety helmets, smoke detector, smoking near bedding or upholstery.   Dental health: Discussed importance of regular tooth brushing, flossing, and dental visits.   Follow up plan: NEXT PREVENTATIVE PHYSICAL DUE IN 1 YEAR. Return in about 1 month (around 08/23/2017) for follow up mood.

## 2017-07-26 NOTE — Assessment & Plan Note (Signed)
Encouraged slow, steady weight loss of 1-2lbs a week. Call with any concerns.  

## 2017-07-26 NOTE — Assessment & Plan Note (Signed)
Recheck labs today. Await results.  

## 2017-07-26 NOTE — Assessment & Plan Note (Signed)
Rechecking levels today. Await results. Call with any concerns.  

## 2017-07-26 NOTE — Patient Instructions (Signed)

## 2017-07-27 ENCOUNTER — Telehealth: Payer: Self-pay | Admitting: Family Medicine

## 2017-07-27 DIAGNOSIS — D72829 Elevated white blood cell count, unspecified: Secondary | ICD-10-CM

## 2017-07-27 DIAGNOSIS — E039 Hypothyroidism, unspecified: Secondary | ICD-10-CM

## 2017-07-27 LAB — CBC WITH DIFFERENTIAL/PLATELET
BASOS ABS: 0.1 10*3/uL (ref 0.0–0.2)
Basos: 1 %
EOS (ABSOLUTE): 0.3 10*3/uL (ref 0.0–0.4)
Eos: 3 %
Hematocrit: 44.6 % (ref 37.5–51.0)
Hemoglobin: 15.6 g/dL (ref 13.0–17.7)
IMMATURE GRANS (ABS): 0.1 10*3/uL (ref 0.0–0.1)
Immature Granulocytes: 1 %
LYMPHS ABS: 3.2 10*3/uL — AB (ref 0.7–3.1)
Lymphs: 27 %
MCH: 29.4 pg (ref 26.6–33.0)
MCHC: 35 g/dL (ref 31.5–35.7)
MCV: 84 fL (ref 79–97)
MONOS ABS: 0.9 10*3/uL (ref 0.1–0.9)
Monocytes: 7 %
NEUTROS ABS: 7.1 10*3/uL — AB (ref 1.4–7.0)
Neutrophils: 61 %
PLATELETS: 355 10*3/uL (ref 150–450)
RBC: 5.3 x10E6/uL (ref 4.14–5.80)
RDW: 13.2 % (ref 12.3–15.4)
WBC: 11.7 10*3/uL — ABNORMAL HIGH (ref 3.4–10.8)

## 2017-07-27 LAB — LIPID PANEL W/O CHOL/HDL RATIO
Cholesterol, Total: 160 mg/dL (ref 100–199)
HDL: 37 mg/dL — AB (ref >39)
LDL CALC: 87 mg/dL (ref 0–99)
Triglycerides: 178 mg/dL — ABNORMAL HIGH (ref 0–149)
VLDL CHOLESTEROL CAL: 36 mg/dL (ref 5–40)

## 2017-07-27 LAB — COMPREHENSIVE METABOLIC PANEL
A/G RATIO: 2 (ref 1.2–2.2)
ALK PHOS: 68 IU/L (ref 39–117)
ALT: 30 IU/L (ref 0–44)
AST: 18 IU/L (ref 0–40)
Albumin: 4.8 g/dL (ref 3.5–5.5)
BILIRUBIN TOTAL: 0.5 mg/dL (ref 0.0–1.2)
BUN/Creatinine Ratio: 10 (ref 9–20)
BUN: 11 mg/dL (ref 6–20)
CO2: 23 mmol/L (ref 20–29)
CREATININE: 1.07 mg/dL (ref 0.76–1.27)
Calcium: 9.9 mg/dL (ref 8.7–10.2)
Chloride: 103 mmol/L (ref 96–106)
GFR, EST AFRICAN AMERICAN: 110 mL/min/{1.73_m2} (ref >59)
GFR, EST NON AFRICAN AMERICAN: 95 mL/min/{1.73_m2} (ref >59)
Globulin, Total: 2.4 g/dL (ref 1.5–4.5)
Glucose: 91 mg/dL (ref 65–99)
POTASSIUM: 4.8 mmol/L (ref 3.5–5.2)
Sodium: 140 mmol/L (ref 134–144)
TOTAL PROTEIN: 7.2 g/dL (ref 6.0–8.5)

## 2017-07-27 LAB — TSH: TSH: 60.07 u[IU]/mL — AB (ref 0.450–4.500)

## 2017-07-27 LAB — HIV ANTIBODY (ROUTINE TESTING W REFLEX): HIV SCREEN 4TH GENERATION: NONREACTIVE

## 2017-07-27 MED ORDER — LEVOTHYROXINE SODIUM 200 MCG PO TABS: 200.0000 ug | ORAL_TABLET | Freq: Every day | ORAL | 1 refills | Status: DC

## 2017-07-27 NOTE — Telephone Encounter (Signed)
Called patient, no answer, unable to leave a message, will try again.   

## 2017-07-27 NOTE — Telephone Encounter (Signed)
Please let him know his labs look good except his thyroid is severely under treated. Please make sure he's taking his medicine every day. I've sent him through daily and I'd like him to come back in for a blood test in 6 weeks. Thanks!

## 2017-07-30 ENCOUNTER — Other Ambulatory Visit: Payer: Self-pay

## 2017-07-30 NOTE — Telephone Encounter (Signed)
Prior authorization for Aripiprazole 2 mg was approved from 07/30/17 - 07/25/18. Authorization number: [id number]

## 2017-08-30 ENCOUNTER — Ambulatory Visit: Payer: Medicaid Other | Admitting: Family Medicine

## 2017-10-22 ENCOUNTER — Other Ambulatory Visit: Payer: Self-pay | Admitting: Family Medicine

## 2017-10-22 NOTE — Telephone Encounter (Signed)
Needs blood work to see if we have the right dose.

## 2017-10-22 NOTE — Telephone Encounter (Signed)
Patient called, left VM to come to the office for a thyroid level lab draw or call to let us know when he will be coming into the office, advised a level will need to be done in order to dose correctly.  Levothyroxine refill Last refill:07/27/17 #30/1 refill Last OV:07/26/17 ZOX:[id number]:[facility] [address] - [place], [place] - [id number] [phone] (Phone) [phone] (Fax)

## 2017-10-22 NOTE — Telephone Encounter (Signed)
Left message on machine for pt to return call to the office.  

## 2017-11-05 ENCOUNTER — Other Ambulatory Visit: Payer: Self-pay

## 2017-11-05 DIAGNOSIS — D72829 Elevated white blood cell count, unspecified: Secondary | ICD-10-CM

## 2017-11-05 DIAGNOSIS — E039 Hypothyroidism, unspecified: Secondary | ICD-10-CM

## 2017-11-06 ENCOUNTER — Other Ambulatory Visit: Payer: Self-pay | Admitting: Family Medicine

## 2017-11-06 LAB — THYROID PANEL WITH TSH
FREE THYROXINE INDEX: 2.3 (ref 1.2–4.9)
T3 Uptake Ratio: 25 % (ref 24–39)
T4 TOTAL: 9.3 ug/dL (ref 4.5–12.0)
TSH: 4.44 u[IU]/mL (ref 0.450–4.500)

## 2017-11-06 LAB — CBC WITH DIFFERENTIAL/PLATELET
BASOS: 1 %
Basophils Absolute: 0.1 10*3/uL (ref 0.0–0.2)
EOS (ABSOLUTE): 0.3 10*3/uL (ref 0.0–0.4)
EOS: 3 %
HEMATOCRIT: 44.5 % (ref 37.5–51.0)
HEMOGLOBIN: 15 g/dL (ref 13.0–17.7)
IMMATURE GRANS (ABS): 0.1 10*3/uL (ref 0.0–0.1)
Immature Granulocytes: 1 %
LYMPHS: 27 %
Lymphocytes Absolute: 2.9 10*3/uL (ref 0.7–3.1)
MCH: 29.3 pg (ref 26.6–33.0)
MCHC: 33.7 g/dL (ref 31.5–35.7)
MCV: 87 fL (ref 79–97)
MONOCYTES: 8 %
Monocytes Absolute: 0.9 10*3/uL (ref 0.1–0.9)
NEUTROS ABS: 6.3 10*3/uL (ref 1.4–7.0)
Neutrophils: 60 %
Platelets: 322 10*3/uL (ref 150–450)
RBC: 5.12 x10E6/uL (ref 4.14–5.80)
RDW: 13.6 % (ref 12.3–15.4)
WBC: 10.7 10*3/uL (ref 3.4–10.8)

## 2017-11-06 MED ORDER — LEVOTHYROXINE SODIUM 200 MCG PO TABS: 200.0000 ug | ORAL_TABLET | Freq: Every day | ORAL | 3 refills | Status: DC

## 2018-01-13 ENCOUNTER — Other Ambulatory Visit: Payer: Self-pay | Admitting: Family Medicine

## 2018-04-04 ENCOUNTER — Other Ambulatory Visit: Payer: Self-pay | Admitting: Family Medicine

## 2018-04-04 NOTE — Telephone Encounter (Signed)
Requested medication (s) are due for refill today yes  Requested medication (s) are on the active medication list yes  Future visit scheduled no. Per LOV note on 07/26/17, patient was to follow-up in 1 month. Pending and routing to PCP for approval.  TC to patient-left VM to call and schedule appointment.    Requested Prescriptions  Pending Prescriptions Disp Refills   buPROPion (WELLBUTRIN XL) 300 MG 24 hr tablet 90 tablet 0    Sig: Take 1 tablet (300 mg total) by mouth daily.     Psychiatry: Antidepressants - bupropion Failed - 04/04/2018 11:54 AM      Failed - Valid encounter within last 6 months    Recent Outpatient Visits          8 months ago Routine general medical examination at a health care facility   [facility], [place] P, DO   10 months ago Pain of right eye   [facility] [place], [place]             Passed - Completed PHQ-2 or PHQ-9 in the last 360 days.      Passed - Last BP in normal range    BP Readings from Last 1 Encounters:  07/26/17 122/84

## 2018-04-04 NOTE — Telephone Encounter (Signed)
Copied from CRM [id number]. Topic: Quick Communication - Rx Refill/Question >> Apr 04, 2018 11:50 AM Tamela Oddi wrote: Medication: buPROPion (WELLBUTRIN XL) 300 MG 24 hr tablet  Patient called to request a refill for the above medication  Preferred Pharmacy (with phone number or street name): [facility] [address] [place] - [address] [phone] (Phone) [phone] (Fax)

## 2018-04-08 ENCOUNTER — Ambulatory Visit (INDEPENDENT_AMBULATORY_CARE_PROVIDER_SITE_OTHER): Payer: Self-pay | Admitting: Family Medicine

## 2018-04-08 ENCOUNTER — Encounter: Payer: Self-pay | Admitting: Family Medicine

## 2018-04-08 VITALS — BP 118/73 | HR 91 | Temp 98.7°F | Ht 71.0 in | Wt 347.2 lb

## 2018-04-08 DIAGNOSIS — F332 Major depressive disorder, recurrent severe without psychotic features: Secondary | ICD-10-CM

## 2018-04-08 MED ORDER — BUPROPION HCL ER (XL) 300 MG PO TB24: 300.0000 mg | ORAL_TABLET | Freq: Every day | ORAL | 1 refills | Status: DC

## 2018-04-08 NOTE — Patient Instructions (Signed)
[phone] - mobile crisis center phone number

## 2018-04-08 NOTE — Assessment & Plan Note (Signed)
Stable on wellbutrin, continue current regimen. Very seldom passive thoughts of being better off dead, denies being a danger to himself and has no plans or intent. Mobile crisis center phone number and local walk in clinics/ER information reviewed

## 2018-04-08 NOTE — Progress Notes (Signed)
BP 118/73 (BP Location: Right Arm, Patient Position: Sitting, Cuff Size: Large)   Pulse 91   Temp 98.7 F (37.1 C) (Oral)   Ht 5\' 11"  (1.803 m)   Wt (!) 347 lb 3.2 oz (157.5 kg)   SpO2 97%   BMI 48.42 kg/m    Subjective:    Patient ID: Joe Haley, male    DOB: 02/23/1990, 28 y.o.   MRN: [id number]  HPI: Joe Haley is a 28 y.o. male  Chief Complaint  Patient presents with  . Depression    Patient states he's here for medication refills.    Here today for 6 month f/u depression. No longer on abilify or prozac, just taking wellbutrin and doing well on that. No new concerns today. No recent thoughts of harming himself, though states he used to think about it at times passively without any intent. Feels safe to himself and stable currently.   Depression screen [facility] 2/9 04/08/2018 07/26/2017  Decreased Interest 2 2  Down, Depressed, Hopeless 2 2  PHQ - 2 Score 4 4  Altered sleeping 2 2  Tired, decreased energy 2 2  Change in appetite 2 2  Feeling bad or failure about yourself  2 2  Trouble concentrating 1 2  Moving slowly or fidgety/restless 0 2  Suicidal thoughts 1 2  PHQ-9 Score 14 18  Difficult doing work/chores - Very difficult    Relevant past medical, surgical, family and social history reviewed and updated as indicated. Interim medical history since our last visit reviewed. Allergies and medications reviewed and updated.  Review of Systems  Per HPI unless specifically indicated above     Objective:    BP 118/73 (BP Location: Right Arm, Patient Position: Sitting, Cuff Size: Large)   Pulse 91   Temp 98.7 F (37.1 C) (Oral)   Ht 5\' 11"  (1.803 m)   Wt (!) 347 lb 3.2 oz (157.5 kg)   SpO2 97%   BMI 48.42 kg/m   Wt Readings from Last 3 Encounters:  04/08/18 (!) 347 lb 3.2 oz (157.5 kg)  07/26/17 (!) 334 lb (151.5 kg)  05/25/17 (!) 335 lb (152 kg)    Physical Exam Vitals signs and nursing note reviewed.  Constitutional:      Appearance: He is  obese.  HENT:     Head: Atraumatic.  Eyes:     Extraocular Movements: Extraocular movements intact.     Conjunctiva/sclera: Conjunctivae normal.  Neck:     Musculoskeletal: Normal range of motion and neck supple.  Cardiovascular:     Rate and Rhythm: Normal rate and regular rhythm.  Pulmonary:     Effort: Pulmonary effort is normal.     Breath sounds: Normal breath sounds.  Musculoskeletal: Normal range of motion.  Skin:    General: Skin is warm and dry.  Neurological:     General: No focal deficit present.     Mental Status: He is alert and oriented to person, place, and time.  Psychiatric:        Mood and Affect: Mood normal.        Thought Content: Thought content normal.        Judgment: Judgment normal.     Results for orders placed or performed in visit on 11/05/17  CBC with Differential/Platelet  Result Value Ref Range   WBC 10.7 3.4 - 10.8 x10E3/uL   RBC 5.12 4.14 - 5.80 x10E6/uL   Hemoglobin 15.0 13.0 - 17.7 g/dL   Hematocrit 44.5  37.5 - 51.0 %   MCV 87 79 - 97 fL   MCH 29.3 26.6 - 33.0 pg   MCHC 33.7 31.5 - 35.7 g/dL   RDW 39.6 88.6 - 48.4 %   Platelets 322 150 - 450 x10E3/uL   Neutrophils 60 Not Estab. %   Lymphs 27 Not Estab. %   Monocytes 8 Not Estab. %   Eos 3 Not Estab. %   Basos 1 Not Estab. %   Neutrophils Absolute 6.3 1.4 - 7.0 x10E3/uL   Lymphocytes Absolute 2.9 0.7 - 3.1 x10E3/uL   Monocytes Absolute 0.9 0.1 - 0.9 x10E3/uL   EOS (ABSOLUTE) 0.3 0.0 - 0.4 x10E3/uL   Basophils Absolute 0.1 0.0 - 0.2 x10E3/uL   Immature Granulocytes 1 Not Estab. %   Immature Grans (Abs) 0.1 0.0 - 0.1 x10E3/uL  Thyroid Panel With TSH  Result Value Ref Range   TSH 4.440 0.450 - 4.500 uIU/mL   T4, Total 9.3 4.5 - 12.0 ug/dL   T3 Uptake Ratio 25 24 - 39 %   Free Thyroxine Index 2.3 1.2 - 4.9      Assessment & Plan:   Problem List Items Addressed This Visit      Other   Severe recurrent major depression without psychotic features (HCC) - Primary    Stable on  wellbutrin, continue current regimen. Very seldom passive thoughts of being better off dead, denies being a danger to himself and has no plans or intent. Mobile crisis center phone number and local walk in clinics/ER information reviewed      Relevant Medications   buPROPion (WELLBUTRIN XL) 300 MG 24 hr tablet       Follow up plan: Return in about 6 months (around 10/09/2018) for 6 month f/u.

## 2018-06-23 IMAGING — CT CT HEAD W/O CM
3 series · 14 of 46 positions shown, 16 images · non-contrast
Comparison: None.

CLINICAL DATA: Hypothyroidism.  Pituitary abnormality.

EXAM:
CT HEAD WITHOUT CONTRAST
TECHNIQUE: Contiguous axial images were obtained from the base of the skull
through the vertex without intravenous contrast.

[Series 2: head wo · axial · 0.47mm/px · z∈[-140,-20]mm · 8 of 29 slices shown, 10 images]
[im 3/29  brain]
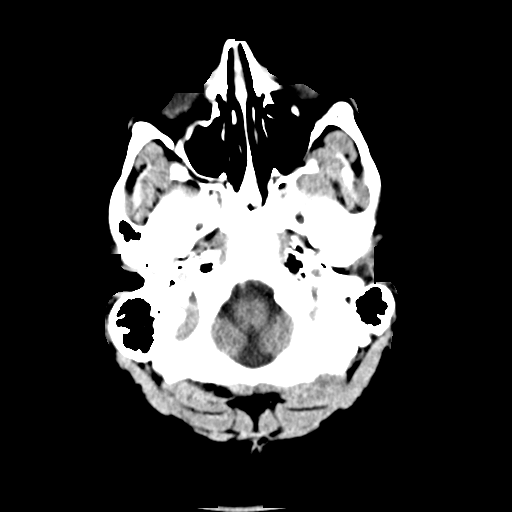
[im 3/29  bone]
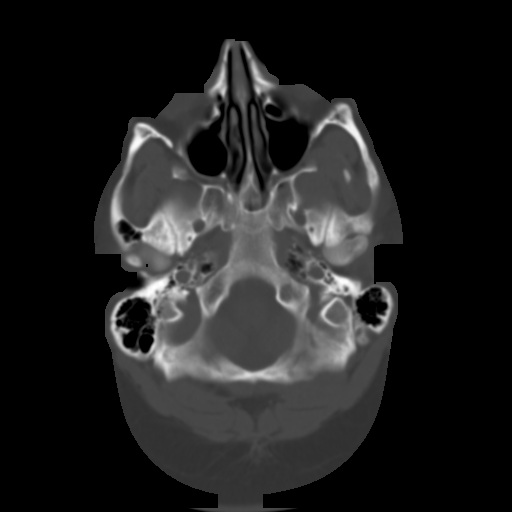
[im 7/29  brain]
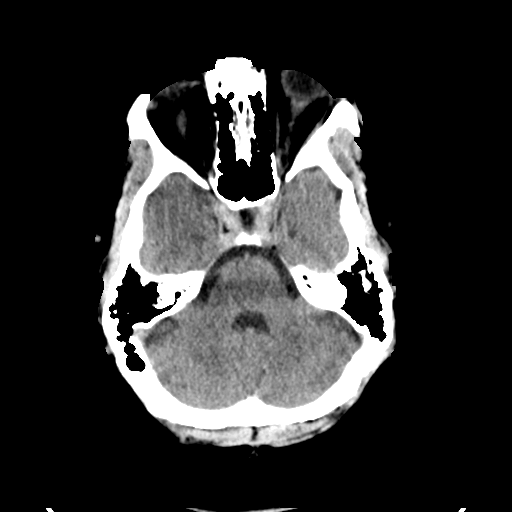
[im 10/29  brain]
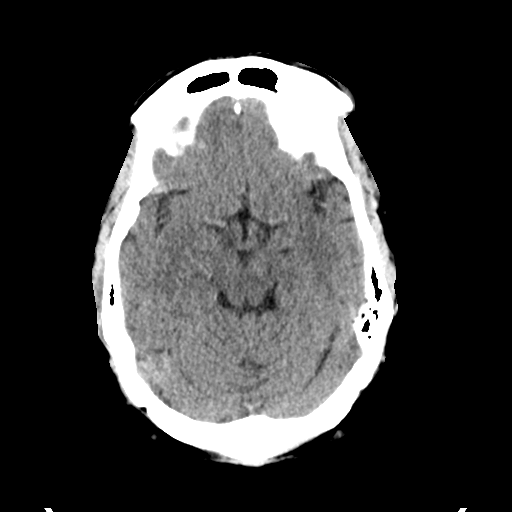
[im 13/29  brain]
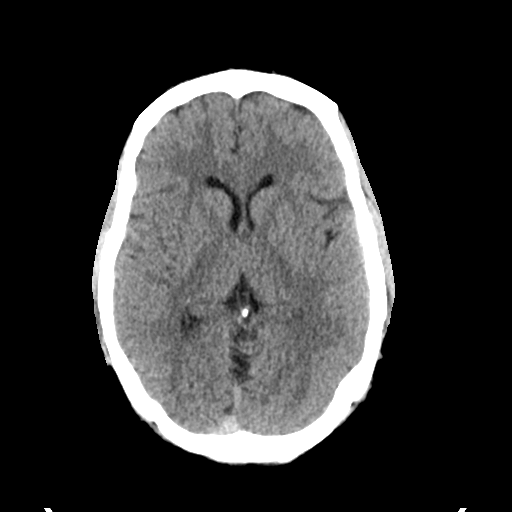
[im 17/29  brain]
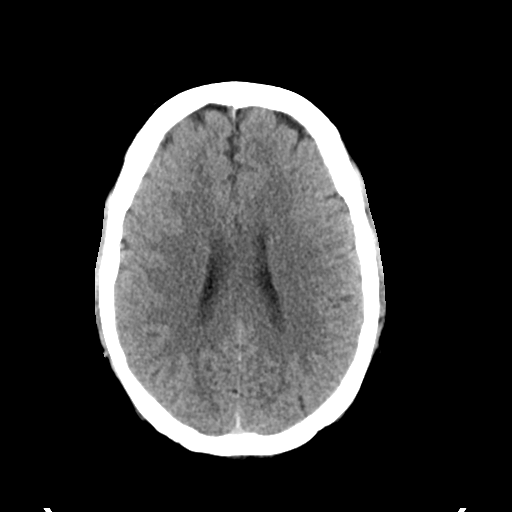
[im 17/29  bone]
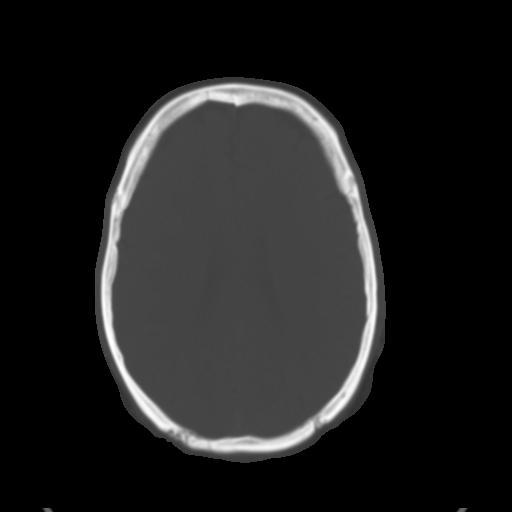
[im 20/29  brain]
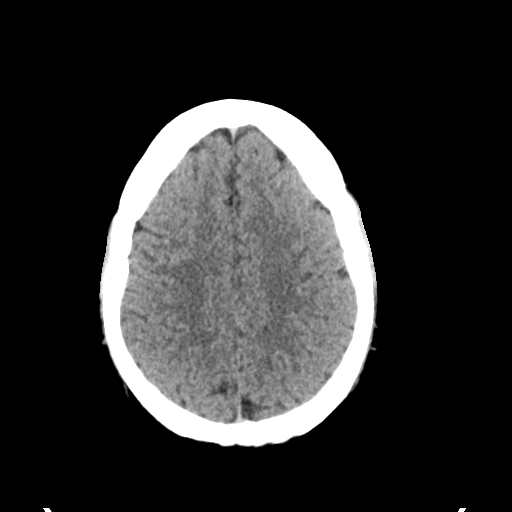
[im 23/29  brain]
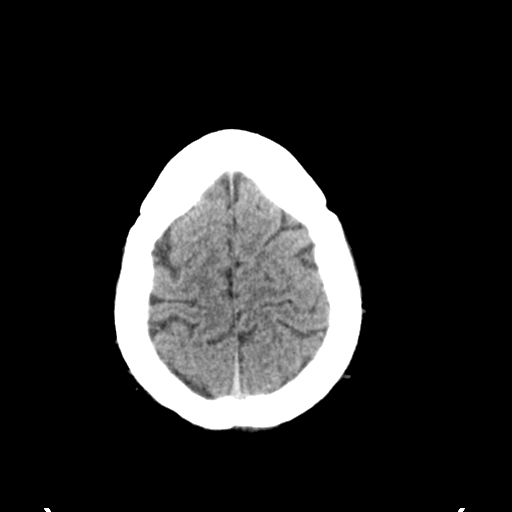
[im 27/29  brain]
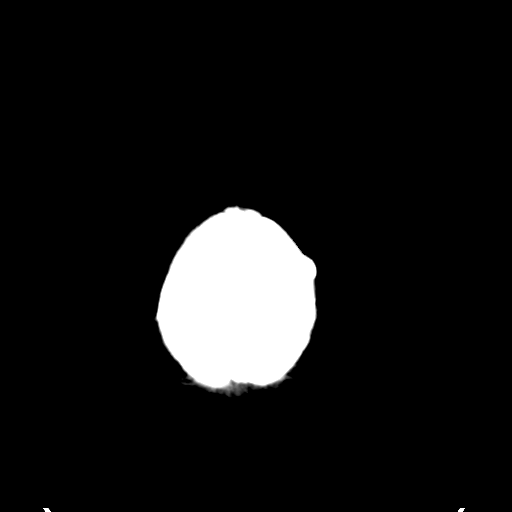

[Series 4: coronal soft tissue · coronal · 0.29mm/px · 3 of 60 slices shown]
[im 20/60  brain]
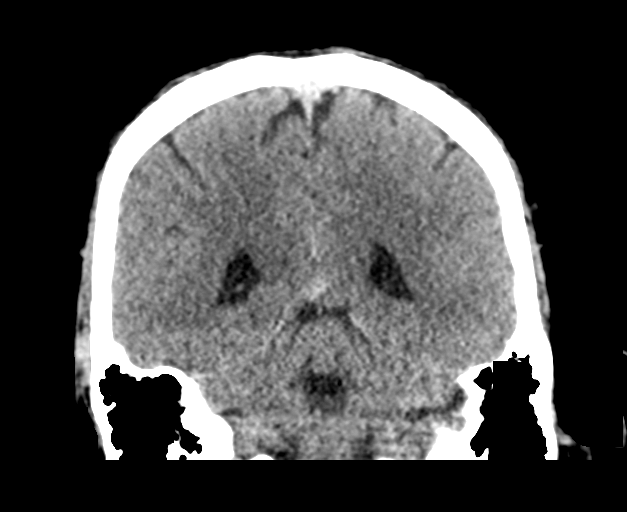
[im 27/60  brain]
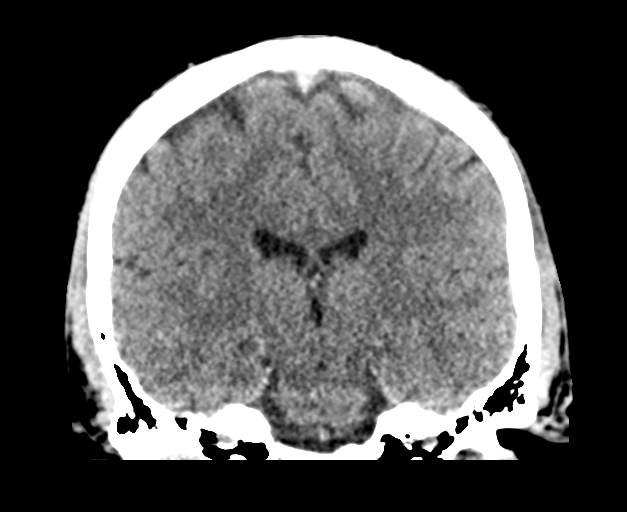
[im 33/60  brain]
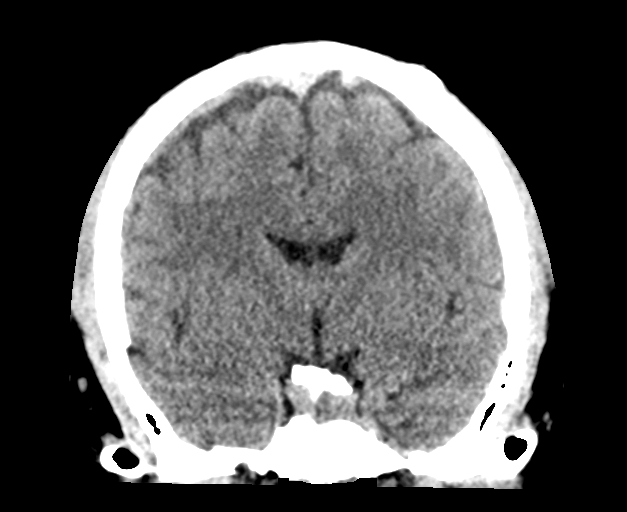

[Series 5: sagittal soft tissue · sagittal · 0.28mm/px · 3 of 51 slices shown]
[im 17/51  brain]
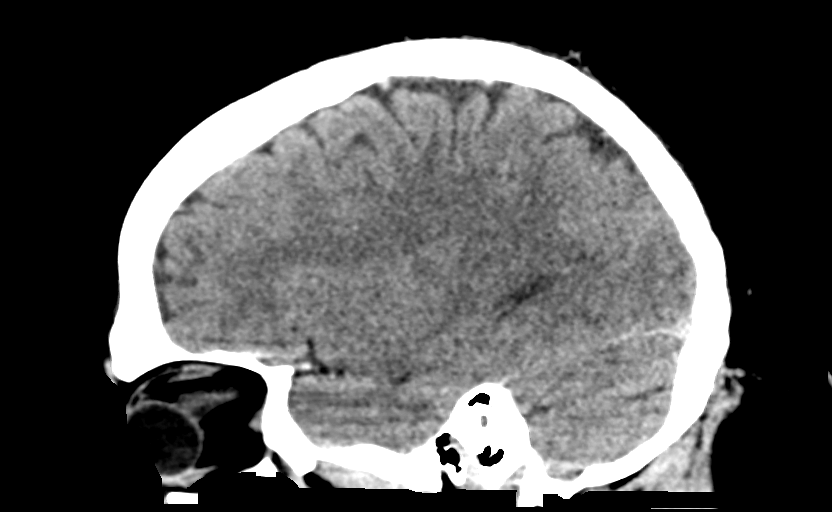
[im 26/51  brain]
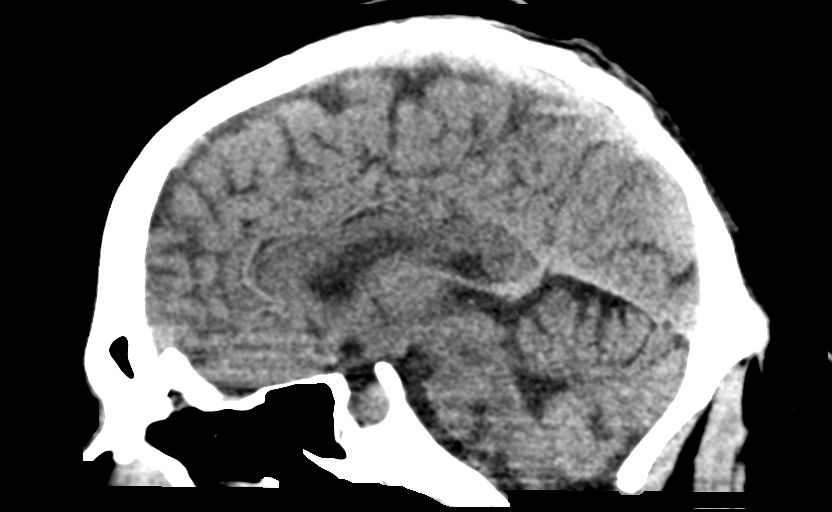
[im 34/51  brain]
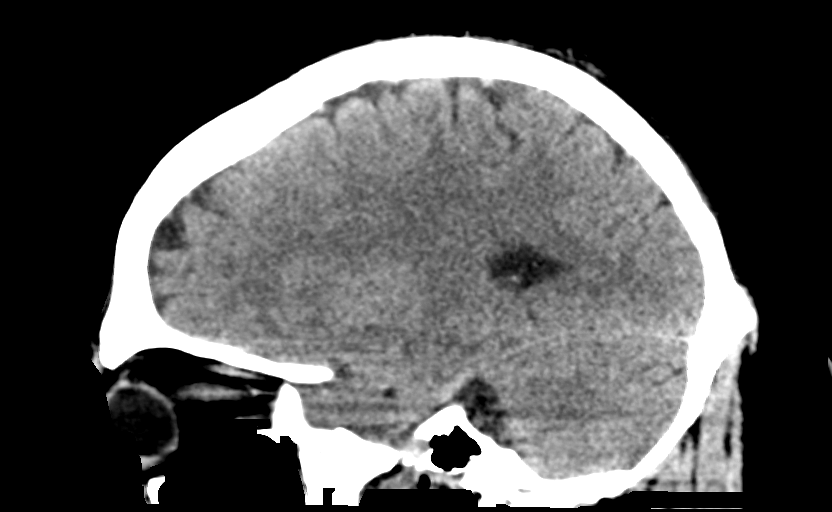

[14 of 46 positions shown; findings below may reference images not displayed]

FINDINGS: Brain: No acute infarct, hemorrhage, or mass lesion is present. The
ventricles are of normal size. No significant extraaxial fluid
collection is present.

The brainstem and cerebellum are normal. No significant white matter
disease is present.

Although the study was not tailored specifically for the pituitary
gland, pituitary and sella turcica are grossly normal.

Vascular: No hyperdense vessel or unexpected calcification.

Skull: Normal. Negative for fracture or focal lesion.

Sinuses/Orbits: The paranasal sinuses and mastoid air cells are
clear. The globes and orbits are within normal limits.
IMPRESSION: Negative CT of the head.

## 2018-11-20 ENCOUNTER — Other Ambulatory Visit: Payer: Self-pay | Admitting: Family Medicine

## 2018-11-20 MED ORDER — LEVOTHYROXINE SODIUM 200 MCG PO TABS: 200.0000 ug | ORAL_TABLET | Freq: Every day | ORAL | 0 refills | Status: DC

## 2018-11-20 MED ORDER — BUPROPION HCL ER (XL) 300 MG PO TB24: 300.0000 mg | ORAL_TABLET | Freq: Every day | ORAL | 0 refills | Status: DC

## 2018-11-20 NOTE — Telephone Encounter (Signed)
Requested medication (s) are due for refill today: yes  Requested medication (s) are on the active medication list: yes  Last refill:  04/08/2018  Future visit scheduled: no  Notes to clinic:  Overdue for office visit Review for refill    Requested Prescriptions  Pending Prescriptions Disp Refills   buPROPion (WELLBUTRIN XL) 300 MG 24 hr tablet 90 tablet 1    Sig: Take 1 tablet (300 mg total) by mouth daily.     Psychiatry: Antidepressants - bupropion Failed - 11/20/2018  9:30 AM      Failed - Valid encounter within last 6 months    Recent Outpatient Visits          7 months ago Severe recurrent major depression without psychotic features [facility])   [facility] [place], [place]   1 year ago Routine general medical examination at a health care facility   [facility], [place], [place]   1 year ago Pain of right eye   [facility] [place], [place]             Passed - Last BP in normal range    BP Readings from Last 1 Encounters:  04/08/18 118/73         Passed - Completed PHQ-2 or PHQ-9 in the last 360 days.       levothyroxine (SYNTHROID) 200 MCG tablet 90 tablet 3    Sig: Take 1 tablet (200 mcg total) by mouth daily.     Endocrinology:  Hypothyroid Agents Failed - 11/20/2018  9:30 AM      Failed - TSH needs to be rechecked within 3 months after an abnormal result. Refill until TSH is due.      Failed - TSH in normal range and within 360 days    TSH  Date Value Ref Range Status  11/05/2017 4.440 0.450 - 4.500 uIU/mL Final         Passed - Valid encounter within last 12 months    Recent Outpatient Visits          7 months ago Severe recurrent major depression without psychotic features [facility])   [facility] Vol[place], [place]   1 year ago Routine general medical examination at a health care facility   [place], [place], [place]   1 year ago Pain of right eye    [facility] Vol[place], [place]

## 2018-11-20 NOTE — Telephone Encounter (Signed)
Medication Refill - Medication: levothyroxine (SYNTHROID, LEVOTHROID) 200 MCG tablet and buPROPion (WELLBUTRIN XL) 300 MG 24 hr tablet    Preferred Pharmacy (with phone number or street name):  [place] [facility] [address], [place] [phone] (Phone) [phone] (Fax)

## 2018-12-20 ENCOUNTER — Other Ambulatory Visit: Payer: Self-pay | Admitting: Family Medicine

## 2018-12-20 NOTE — Telephone Encounter (Signed)
Medication Refill - Medication: levothyroxine (SYNTHROID) 200 MCG tablet buPROPion (WELLBUTRIN XL) 300 MG 24 hr tablet  Has the patient contacted their pharmacy? yes (Agent: If no, request that the patient contact the pharmacy for the refill.) (Agent: If yes, when and what did the pharmacy advise?)  Preferred Pharmacy (with phone number or street name):  [facility] [address] [place] [phone] (Phone) [phone] (Fax)   Agent: Please be advised that RX refills may take up to 3 business days. We ask that you follow-up with your pharmacy.

## 2018-12-20 NOTE — Telephone Encounter (Signed)
Requested medication (s) are due for refill today: yes  Requested medication (s) are on the active medication list: yes  Last refill:  11/20/2018  Future visit scheduled: no  Notes to clinic:  Patient overdue for office visit Review for refill   Requested Prescriptions  Pending Prescriptions Disp Refills   buPROPion (WELLBUTRIN XL) 300 MG 24 hr tablet 30 tablet 0    Sig: Take 1 tablet (300 mg total) by mouth daily.     Psychiatry: Antidepressants - bupropion Failed - 12/20/2018  9:26 AM      Failed - Valid encounter within last 6 months    Recent Outpatient Visits          8 months ago Severe recurrent major depression without psychotic features [facility])   [facility] [place], [place]   1 year ago Routine general medical examination at a health care facility   [facility], [place], [place]   1 year ago Pain of right eye   [facility] [place], [place]             Passed - Last BP in normal range    BP Readings from Last 1 Encounters:  04/08/18 118/73         Passed - Completed PHQ-2 or PHQ-9 in the last 360 days.       levothyroxine (SYNTHROID) 200 MCG tablet 30 tablet 0    Sig: Take 1 tablet (200 mcg total) by mouth daily.     Endocrinology:  Hypothyroid Agents Failed - 12/20/2018  9:26 AM      Failed - TSH needs to be rechecked within 3 months after an abnormal result. Refill until TSH is due.      Failed - TSH in normal range and within 360 days    TSH  Date Value Ref Range Status  11/05/2017 4.440 0.450 - 4.500 uIU/mL Final         Passed - Valid encounter within last 12 months    Recent Outpatient Visits          8 months ago Severe recurrent major depression without psychotic features [facility])   [facility] [place], [place]   1 year ago Routine general medical examination at a health care facility   [place], [place], [place]   1 year ago Pain of  right eye   [facility] Volney American, [place]

## 2018-12-30 ENCOUNTER — Encounter: Payer: Self-pay | Admitting: Family Medicine

## 2018-12-30 ENCOUNTER — Ambulatory Visit (INDEPENDENT_AMBULATORY_CARE_PROVIDER_SITE_OTHER): Payer: Self-pay | Admitting: Family Medicine

## 2018-12-30 ENCOUNTER — Other Ambulatory Visit: Payer: Self-pay

## 2018-12-30 VITALS — BP 117/76 | HR 89 | Temp 98.1°F | Ht 71.0 in | Wt 363.0 lb

## 2018-12-30 DIAGNOSIS — F411 Generalized anxiety disorder: Secondary | ICD-10-CM

## 2018-12-30 DIAGNOSIS — E039 Hypothyroidism, unspecified: Secondary | ICD-10-CM

## 2018-12-30 DIAGNOSIS — F332 Major depressive disorder, recurrent severe without psychotic features: Secondary | ICD-10-CM

## 2018-12-30 MED ORDER — FLUOXETINE HCL 20 MG PO TABS: 20.0000 mg | ORAL_TABLET | Freq: Every day | ORAL | 0 refills | Status: DC

## 2018-12-30 MED ORDER — LEVOTHYROXINE SODIUM 200 MCG PO TABS: 200.0000 ug | ORAL_TABLET | Freq: Every day | ORAL | 1 refills | Status: DC

## 2018-12-30 MED ORDER — BUPROPION HCL ER (XL) 300 MG PO TB24: 300.0000 mg | ORAL_TABLET | Freq: Every day | ORAL | 1 refills | Status: DC

## 2018-12-30 NOTE — Assessment & Plan Note (Signed)
Exacerbated by stressors. Will add prozac and continue to monitor for benefit. Does not want to go to Psychiatry at this time

## 2018-12-30 NOTE — Progress Notes (Signed)
BP 117/76   Pulse 89   Temp 98.1 F (36.7 C) (Oral)   Ht 5\' 11"  (1.803 m)   Wt (!) 363 lb (164.7 kg)   SpO2 97%   BMI 50.63 kg/m    Subjective:    Patient ID: Joe Haley, male    DOB: 1990-02-07, 28 y.o.   MRN: [id number]  HPI: Joe Haley is a 28 y.o. male  Chief Complaint  Patient presents with  . Hypothyroidism  . Depression  . Medication Refill   Presenting today for depression and hypothyroid f/u.   Feels the wellbutrin seems to help some with moods and anxiety, but having some severe breakthrough sxs because it's been a really hard year per patient. Also concerned that he gets severe headaches if he misses a day on his wellbutrin. Denies SI/HI, just feels very down and depressed much of the time. Has tried abilify and prozac in the past but never more than a few weeks with either. No side effects he can recall.   Taking his synthroid without issue, no side effects and taking daily. Due for labwork.   Depression screen [facility] 2/9 12/30/2018 04/08/2018 07/26/2017  Decreased Interest 3 2 2   Down, Depressed, Hopeless 3 2 2   PHQ - 2 Score 6 4 4   Altered sleeping 3 2 2   Tired, decreased energy 3 2 2   Change in appetite 3 2 2   Feeling bad or failure about yourself  3 2 2   Trouble concentrating 3 1 2   Moving slowly or fidgety/restless 3 0 2  Suicidal thoughts 2 1 2   PHQ-9 Score 26 14 18   Difficult doing work/chores - - Very difficult   GAD 7 : Generalized Anxiety Score 12/30/2018 04/08/2018 07/26/2017  Nervous, Anxious, on Edge 3 1 2   Control/stop worrying 3 1 1   Worry too much - different things 2 1 2   Trouble relaxing 3 2 2   Restless 3 2 1   Easily annoyed or irritable 1 1 2   Afraid - awful might happen 3 1 2   Total GAD 7 Score 18 9 12   Anxiety Difficulty Somewhat difficult Somewhat difficult Very difficult     Relevant past medical, surgical, family and social history reviewed and updated as indicated. Interim medical history since our last visit  reviewed. Allergies and medications reviewed and updated.  Review of Systems  Per HPI unless specifically indicated above     Objective:    BP 117/76   Pulse 89   Temp 98.1 F (36.7 C) (Oral)   Ht 5\' 11"  (1.803 m)   Wt (!) 363 lb (164.7 kg)   SpO2 97%   BMI 50.63 kg/m   Wt Readings from Last 3 Encounters:  12/30/18 (!) 363 lb (164.7 kg)  04/08/18 (!) 347 lb 3.2 oz (157.5 kg)  07/26/17 (!) 334 lb (151.5 kg)    Physical Exam Vitals signs and nursing note reviewed.  Constitutional:      Appearance: Normal appearance. He is obese.  HENT:     Head: Atraumatic.  Eyes:     Extraocular Movements: Extraocular movements intact.     Conjunctiva/sclera: Conjunctivae normal.  Neck:     Musculoskeletal: Normal range of motion and neck supple.  Cardiovascular:     Rate and Rhythm: Normal rate and regular rhythm.  Pulmonary:     Effort: Pulmonary effort is normal.     Breath sounds: Normal breath sounds.  Musculoskeletal: Normal range of motion.  Skin:    General: Skin is  warm and dry.  Neurological:     General: No focal deficit present.     Mental Status: He is oriented to person, place, and time.  Psychiatric:        Mood and Affect: Mood normal.        Thought Content: Thought content normal.        Judgment: Judgment normal.     Results for orders placed or performed in visit on 11/05/17  CBC with Differential/Platelet  Result Value Ref Range   WBC 10.7 3.4 - 10.8 x10E3/uL   RBC 5.12 4.14 - 5.80 x10E6/uL   Hemoglobin 15.0 13.0 - 17.7 g/dL   Hematocrit 08.6 76.1 - 51.0 %   MCV 87 79 - 97 fL   MCH 29.3 26.6 - 33.0 pg   MCHC 33.7 31.5 - 35.7 g/dL   RDW 95.0 93.2 - 67.1 %   Platelets 322 150 - 450 x10E3/uL   Neutrophils 60 Not Estab. %   Lymphs 27 Not Estab. %   Monocytes 8 Not Estab. %   Eos 3 Not Estab. %   Basos 1 Not Estab. %   Neutrophils Absolute 6.3 1.4 - 7.0 x10E3/uL   Lymphocytes Absolute 2.9 0.7 - 3.1 x10E3/uL   Monocytes Absolute 0.9 0.1 - 0.9  x10E3/uL   EOS (ABSOLUTE) 0.3 0.0 - 0.4 x10E3/uL   Basophils Absolute 0.1 0.0 - 0.2 x10E3/uL   Immature Granulocytes 1 Not Estab. %   Immature Grans (Abs) 0.1 0.0 - 0.1 x10E3/uL  Thyroid Panel With TSH  Result Value Ref Range   TSH 4.440 0.450 - 4.500 uIU/mL   T4, Total 9.3 4.5 - 12.0 ug/dL   T3 Uptake Ratio 25 24 - 39 %   Free Thyroxine Index 2.3 1.2 - 4.9      Assessment & Plan:   Problem List Items Addressed This Visit      Endocrine   Acquired hypothyroidism - Primary    Recheck TSH, adjust as needed. Continue current regimen      Relevant Medications   levothyroxine (SYNTHROID) 200 MCG tablet   Other Relevant Orders   TSH     Other   Severe recurrent major depression without psychotic features (HCC)    Exacerbated by life stressors, will add prozac back in and take consistently x 1 month in addition to wellbutrin. F/u at that time to see if benefiting him      Relevant Medications   FLUoxetine (PROZAC) 20 MG tablet   buPROPion (WELLBUTRIN XL) 300 MG 24 hr tablet   GAD (generalized anxiety disorder)    Exacerbated by stressors. Will add prozac and continue to monitor for benefit. Does not want to go to Psychiatry at this time      Relevant Medications   FLUoxetine (PROZAC) 20 MG tablet   buPROPion (WELLBUTRIN XL) 300 MG 24 hr tablet       Follow up plan: Return in about 4 weeks (around 01/27/2019) for Mood f/u.

## 2018-12-30 NOTE — Assessment & Plan Note (Signed)
Exacerbated by life stressors, will add prozac back in and take consistently x 1 month in addition to wellbutrin. F/u at that time to see if benefiting him

## 2018-12-30 NOTE — Assessment & Plan Note (Signed)
Recheck TSH, adjust as needed. Continue current regimen 

## 2018-12-31 LAB — TSH: TSH: 2.41 u[IU]/mL (ref 0.450–4.500)

## 2019-01-15 ENCOUNTER — Telehealth: Payer: Self-pay | Admitting: Family Medicine

## 2019-01-15 NOTE — Telephone Encounter (Signed)
Re-sent rx   Copied from [place] [id number]. Topic: Quick Communication - Rx Refill/Question >> Jan 15, 2019 10:35 AM Erick Blinks wrote: Pt states pharmacy denied having his Rx for Levothyroxine, advised that we have a receipt confirmed. Pt wants PCP to know

## 2019-01-17 MED ORDER — LEVOTHYROXINE SODIUM 200 MCG PO TABS: 200.0000 ug | ORAL_TABLET | Freq: Every day | ORAL | 1 refills | Status: DC

## 2019-01-27 ENCOUNTER — Other Ambulatory Visit: Payer: Self-pay

## 2019-01-27 ENCOUNTER — Ambulatory Visit (INDEPENDENT_AMBULATORY_CARE_PROVIDER_SITE_OTHER): Payer: Self-pay | Admitting: Family Medicine

## 2019-01-27 ENCOUNTER — Encounter: Payer: Self-pay | Admitting: Family Medicine

## 2019-01-27 VITALS — BP 121/84 | HR 84 | Temp 98.0°F | Ht 71.0 in | Wt 365.0 lb

## 2019-01-27 DIAGNOSIS — F332 Major depressive disorder, recurrent severe without psychotic features: Secondary | ICD-10-CM

## 2019-01-27 DIAGNOSIS — M79672 Pain in left foot: Secondary | ICD-10-CM

## 2019-01-27 MED ORDER — ARIPIPRAZOLE 2 MG PO TABS: 2.0000 mg | ORAL_TABLET | Freq: Every day | ORAL | 0 refills | Status: DC

## 2019-01-27 NOTE — Progress Notes (Signed)
BP 121/84   Pulse 84   Temp 98 F (36.7 C) (Oral)   Ht 5\' 11"  (1.803 m)   Wt (!) 365 lb (165.6 kg)   SpO2 97%   BMI 50.91 kg/m    Subjective:    Patient ID: , male    DOB: 08/07/1990, 28 y.o.   MRN: [id number]  HPI: Joe Haley is a 28 y.o. male  Chief Complaint  Patient presents with  . Depression  . Anxiety   Patient presenting today for 1 month mood f/u after starting prozac. States it's not benefited him at all regarding moods. Still feeling very down, depressed. Having passive thoughts of being better off dead, but no intent or plan. Does not wish to harm anyone else. States he does not have the finances to see a specialist or counselor at this time. Still taking the wellbutrin which he thinks may help him some.   Left foot pain most of the year on bottom of foot,hurts with walking or applying any pressure on it. Has not tried anything OTC for relief. No swelling, redness, injury.   Depression screen [facility] 2/9 01/27/2019 12/30/2018 04/08/2018  Decreased Interest 3 3 2   Down, Depressed, Hopeless 3 3 2   PHQ - 2 Score 6 6 4   Altered sleeping 3 3 2   Tired, decreased energy 3 3 2   Change in appetite 3 3 2   Feeling bad or failure about yourself  3 3 2   Trouble concentrating 3 3 1   Moving slowly or fidgety/restless 3 3 0  Suicidal thoughts 3 2 1   PHQ-9 Score 27 26 14   Difficult doing work/chores - - -    Relevant past medical, surgical, family and social history reviewed and updated as indicated. Interim medical history since our last visit reviewed. Allergies and medications reviewed and updated.  Review of Systems  Per HPI unless specifically indicated above     Objective:    BP 121/84   Pulse 84   Temp 98 F (36.7 C) (Oral)   Ht 5\' 11"  (1.803 m)   Wt (!) 365 lb (165.6 kg)   SpO2 97%   BMI 50.91 kg/m   Wt Readings from Last 3 Encounters:  01/27/19 (!) 365 lb (165.6 kg)  12/30/18 (!) 363 lb (164.7 kg)  04/08/18 (!) 347 lb 3.2 oz  (157.5 kg)    Physical Exam Vitals and nursing note reviewed.  Constitutional:      Appearance: Normal appearance.  HENT:     Head: Atraumatic.  Eyes:     Extraocular Movements: Extraocular movements intact.     Conjunctiva/sclera: Conjunctivae normal.  Cardiovascular:     Rate and Rhythm: Normal rate and regular rhythm.     Pulses: Normal pulses.  Pulmonary:     Effort: Pulmonary effort is normal.     Breath sounds: Normal breath sounds.  Musculoskeletal:        General: Tenderness (mild ttp left plantar arch) present. No swelling or deformity. Normal range of motion.     Cervical back: Normal range of motion and neck supple.  Skin:    General: Skin is warm and dry.  Neurological:     General: No focal deficit present.     Mental Status: He is oriented to person, place, and time.     Sensory: No sensory deficit.  Psychiatric:        Mood and Affect: Mood normal.        Thought Content: Thought content normal.  Judgment: Judgment normal.     Results for orders placed or performed in visit on 12/30/18  TSH  Result Value Ref Range   TSH 2.410 0.450 - 4.500 uIU/mL      Assessment & Plan:   Problem List Items Addressed This Visit      Other   Severe recurrent major depression without psychotic features ([place]) - Primary    Too much sedation with SSRIs, will continue wellbutrin and start abilify. Referral to Social Work sent to discuss possible counseling opportunities as he would benefit from this but does not have the resources at this time. F/u in 1 month. Crisis hotline and walk in clinics locally reviewed if sxs worsening, ER if having thoughts of self harm or HI      Relevant Orders   CCM Socail Wrok    Other Visit Diagnoses    Left foot pain       Suspect from poor arch support, weight. Weight loss, shoe inserts, and more supportive shoes reviewed       Follow up plan: Return in about 4 weeks (around 02/24/2019) for Mood f/u.

## 2019-01-30 NOTE — Assessment & Plan Note (Signed)
Too much sedation with SSRIs, will continue wellbutrin and start abilify. Referral to Social Work sent to discuss possible counseling opportunities as he would benefit from this but does not have the resources at this time. F/u in 1 month. Crisis hotline and walk in clinics locally reviewed if sxs worsening, ER if having thoughts of self harm or HI

## 2019-02-21 ENCOUNTER — Telehealth: Payer: Self-pay

## 2019-03-05 ENCOUNTER — Other Ambulatory Visit: Payer: Self-pay | Admitting: Family Medicine

## 2019-03-05 NOTE — Telephone Encounter (Signed)
Patient last seen 01/27/19 

## 2019-04-09 ENCOUNTER — Ambulatory Visit: Payer: Self-pay

## 2019-04-09 NOTE — Chronic Care Management (AMB) (Signed)
  Care Management   Follow Up Note   04/09/2019 Name: [name] MRN: [id number] DOB: [birth year]  Referred by: Particia Nearing, PA-C Reason for referral : Care Coordination   [name] is a 29 y.o. year old male who is a primary care patient of Particia Nearing, [place]. The care management team was consulted for assistance with care management and care coordination needs.    Review of patient status, including review of consultants reports, relevant laboratory and other test results, and collaboration with appropriate care team members and the patient's provider was performed as part of comprehensive patient evaluation and provision of chronic care management services.    LCSW completed CCM outreach attempt today but was unable to reach patient successfully. A HIPPA compliant voice message was left encouraging patient to return call once available. LCSW rescheduled CCM SW appointment as well.  A HIPPA compliant phone message was left for the patient providing contact information and requesting a return call.   Dickie La, BSW, MSW, LCSW [facility] Care Management [place]  [facility] [email or web address] Phone: [phone]

## 2019-06-02 ENCOUNTER — Ambulatory Visit: Payer: Self-pay | Admitting: Licensed Clinical Social Worker

## 2019-06-02 NOTE — Chronic Care Management (AMB) (Signed)
  Care Management   Follow Up Note   06/02/2019 Name: Joe Haley MRN: [id number] DOB: 1990/02/12  Referred by: Particia Nearing, PA-C Reason for referral : Care Coordination   Joe Haley is a 29 y.o. year old male who is a primary care patient of Particia Nearing, [place]. The care management team was consulted for assistance with care management and care coordination needs.    Review of patient status, including review of consultants reports, relevant laboratory and other test results, and collaboration with appropriate care team members and the patient's provider was performed as part of comprehensive patient evaluation and provision of chronic care management services.    LCSW completed CCM outreach attempt today but was unable to reach patient successfully. A HIPPA compliant voice message was left encouraging patient to return call once available. LCSW rescheduled CCM SW appointment as well.  A HIPPA compliant phone message was left for the patient providing contact information and requesting a return call.   Dickie La, BSW, MSW, LCSW [facility] Care Management [place]  [facility] [email or web address] Phone: [phone]

## 2019-06-18 ENCOUNTER — Ambulatory Visit: Payer: Self-pay

## 2019-06-18 NOTE — Chronic Care Management (AMB) (Signed)
  Care Management   Follow Up Note   06/18/2019 Name: Finas Delone MRN: [id number] DOB: 11/03/1990  Referred by: Particia Nearing, PA-C Reason for referral : Care Coordination   Arush Shanard Treto is a 29 y.o. year old male who is a primary care patient of Particia Nearing, [place]. The care management team was consulted for assistance with care management and care coordination needs.    Review of patient status, including review of consultants reports, relevant laboratory and other test results, and collaboration with appropriate care team members and the patient's provider was performed as part of comprehensive patient evaluation and provision of chronic care management services.    LCSW completed CCM outreach attempt today but was unable to reach patient successfully. A HIPPA compliant voice message was left encouraging patient to return call once available. LCSW rescheduled CCM SW appointment as well.  A HIPPA compliant phone message was left for the patient providing contact information and requesting a return call.   Dickie La, BSW, MSW, LCSW [facility] Care Management [place]  [facility] [email or web address] Phone: [phone]

## 2019-07-20 ENCOUNTER — Other Ambulatory Visit: Payer: Self-pay | Admitting: Family Medicine

## 2019-07-20 NOTE — Telephone Encounter (Signed)
Requested Prescriptions  Pending Prescriptions Disp Refills  . buPROPion (WELLBUTRIN XL) 300 MG 24 hr tablet [Pharmacy Med Name: buPROPion HCl ER (XL) 300 MG Oral Tablet Extended Release 24 Hour] 90 tablet 1    Sig: Take 1 tablet by mouth once daily     Psychiatry: Antidepressants - bupropion Passed - 07/20/2019  7:32 AM      Passed - Completed PHQ-2 or PHQ-9 in the last 360 days.      Passed - Last BP in normal range    BP Readings from Last 1 Encounters:  01/27/19 121/84         Passed - Valid encounter within last 6 months    Recent Outpatient Visits          5 months ago Severe recurrent major depression without psychotic features [facility])   [facility] Particia Nearing, [place]   6 months ago Acquired hypothyroidism   [facility] [place] Maser [place], [place]   1 year ago Severe recurrent major depression without psychotic features [facility])   [facility] Particia Nearing, [place]   1 year ago Routine general medical examination at a health care facility   [facility], [place] P, DO   2 years ago Pain of right eye   [facility] Particia Nearing, [place]

## 2019-07-24 ENCOUNTER — Other Ambulatory Visit: Payer: Self-pay | Admitting: Family Medicine

## 2019-07-24 NOTE — Telephone Encounter (Signed)
Requested medication (s) are due for refill today: no  Requested medication (s) are on the active medication list: yes  Last refill:  03/05/2019  Future visit scheduled: no  Notes to clinic:  this refill cannot be delegated  Patient has not had a follow up appointment    Requested Prescriptions  Pending Prescriptions Disp Refills   ARIPiprazole (ABILIFY) 2 MG tablet 30 tablet 0    Sig: Take 1 tablet (2 mg total) by mouth daily.      Not Delegated - Psychiatry:  Antipsychotics - Second Generation (Atypical) - aripiprazole Failed - 07/24/2019  2:04 PM      Failed - This refill cannot be delegated      Passed - Valid encounter within last 6 months    Recent Outpatient Visits           5 months ago Severe recurrent major depression without psychotic features [facility])   [facility] Particia Nearing, [place]   6 months ago Acquired hypothyroidism   [facility] [place], [place]   1 year ago Severe recurrent major depression without psychotic features [facility])   [facility] Particia Nearing, [place]   1 year ago Routine general medical examination at a health care facility   [facility], [place] P, DO   2 years ago Pain of right eye   [facility] Particia Nearing, [place]

## 2019-07-24 NOTE — Telephone Encounter (Signed)
Copied from CRM [id number]. Topic: Quick Communication - Rx Refill/Question >> Jul 24, 2019  1:59 PM Jaquita Rector A wrote: Medication: levothyroxine (SYNTHROID) 200 MCG tablet, ARIPiprazole (ABILIFY) 2 MG tablet    Has the patient contacted their pharmacy? Yes.   (Agent: If no, request that the patient contact the pharmacy for the refill.) (Agent: If yes, when and what did the pharmacy advise?)  Preferred Pharmacy (with phone number or street name): [facility] [address] [place], [place] - [address]  Phone:  [phone] Fax:  [phone]     Agent: Please be advised that RX refills may take up to 3 business days. We ask that you follow-up with your pharmacy.

## 2019-08-12 ENCOUNTER — Encounter: Payer: Self-pay | Admitting: Family Medicine

## 2019-08-12 ENCOUNTER — Ambulatory Visit (INDEPENDENT_AMBULATORY_CARE_PROVIDER_SITE_OTHER): Payer: Self-pay | Admitting: Family Medicine

## 2019-08-12 ENCOUNTER — Other Ambulatory Visit: Payer: Self-pay

## 2019-08-12 VITALS — BP 127/81 | HR 88 | Temp 98.4°F | Wt 368.0 lb

## 2019-08-12 DIAGNOSIS — E039 Hypothyroidism, unspecified: Secondary | ICD-10-CM

## 2019-08-12 DIAGNOSIS — F332 Major depressive disorder, recurrent severe without psychotic features: Secondary | ICD-10-CM

## 2019-08-12 DIAGNOSIS — F411 Generalized anxiety disorder: Secondary | ICD-10-CM

## 2019-08-12 MED ORDER — LEVOTHYROXINE SODIUM 200 MCG PO TABS: 200.0000 ug | ORAL_TABLET | Freq: Every day | ORAL | 1 refills | Status: DC

## 2019-08-12 NOTE — Progress Notes (Signed)
BP 127/81   Pulse 88   Temp 98.4 F (36.9 C) (Oral)   Wt (!) 368 lb (166.9 kg)   SpO2 96%   BMI 51.33 kg/m    Subjective:    Patient ID: Joe Haley, male    DOB: 15-May-1990, 29 y.o.   MRN: [id number]  HPI: Joe Haley is a 29 y.o. male  Chief Complaint  Patient presents with  . Hypothyroidism   Here today for 6 month f/u chronic conditions.   Taking synthroid faithfully, asymptomatic. TSH has been stable the last few years on 200 mcg dose. Denies any new concerns there.   Moods and anxiety doing better now than previously, did not feel the prozac helped with anything so stopped it after a few weeks. Only on wellbutrin now. Declines seeing Psychiatry or doing counseling at this time. Denies SI/HI, severe mood swings, frequent panic episodes.   Depression screen [facility] 2/9 08/12/2019 01/27/2019 12/30/2018  Decreased Interest 1 3 3   Down, Depressed, Hopeless 1 3 3   PHQ - 2 Score 2 6 6   Altered sleeping 2 3 3   Tired, decreased energy 1 3 3   Change in appetite 1 3 3   Feeling bad or failure about yourself  1 3 3   Trouble concentrating 1 3 3   Moving slowly or fidgety/restless 1 3 3   Suicidal thoughts 1 3 2   PHQ-9 Score 10 27 26   Difficult doing work/chores Somewhat difficult - -   GAD 7 : Generalized Anxiety Score 08/12/2019 01/27/2019 12/30/2018 04/08/2018  Nervous, Anxious, on Edge 2 3 3 1   Control/stop worrying 1 3 3 1   Worry too much - different things 1 3 2 1   Trouble relaxing 1 3 3 2   Restless 1 1 3 2   Easily annoyed or irritable 0 1 1 1   Afraid - awful might happen 1 3 3 1   Total GAD 7 Score 7 17 18 9   Anxiety Difficulty Somewhat difficult Very difficult Somewhat difficult Somewhat difficult   Relevant past medical, surgical, family and social history reviewed and updated as indicated. Interim medical history since our last visit reviewed. Allergies and medications reviewed and updated.  Review of Systems  Per HPI unless specifically indicated  above     Objective:    BP 127/81   Pulse 88   Temp 98.4 F (36.9 C) (Oral)   Wt (!) 368 lb (166.9 kg)   SpO2 96%   BMI 51.33 kg/m   Wt Readings from Last 3 Encounters:  08/12/19 (!) 368 lb (166.9 kg)  01/27/19 (!) 365 lb (165.6 kg)  12/30/18 (!) 363 lb (164.7 kg)    Physical Exam Vitals and nursing note reviewed.  Constitutional:      Appearance: Normal appearance. He is obese.  HENT:     Head: Atraumatic.  Eyes:     Extraocular Movements: Extraocular movements intact.     Conjunctiva/sclera: Conjunctivae normal.  Cardiovascular:     Rate and Rhythm: Normal rate and regular rhythm.  Pulmonary:     Effort: Pulmonary effort is normal.     Breath sounds: Normal breath sounds.  Musculoskeletal:        General: Normal range of motion.     Cervical back: Normal range of motion and neck supple.  Skin:    General: Skin is warm and dry.  Neurological:     General: No focal deficit present.     Mental Status: He is oriented to person, place, and time.  Psychiatric:  Mood and Affect: Mood normal.        Thought Content: Thought content normal.        Judgment: Judgment normal.     Results for orders placed or performed in visit on 12/30/18  TSH  Result Value Ref Range   TSH 2.410 0.450 - 4.500 uIU/mL      Assessment & Plan:   Problem List Items Addressed This Visit      Endocrine   Acquired hypothyroidism - Primary    Recheck TSH, adjust as needed. Continue current regimen      Relevant Orders   TSH     Other   Severe recurrent major depression without psychotic features (HCC)    Stable currently and well controlled on just wellbutrin. Counseled to be in touch if things worsen again so we can adjust accordingly. F/u in 6 months      GAD (generalized anxiety disorder)       Follow up plan: Return in about 6 months (around 02/12/2020) for 6 month f/u.

## 2019-08-12 NOTE — Assessment & Plan Note (Signed)
Recheck TSH, adjust as needed. Continue current regimen 

## 2019-08-12 NOTE — Assessment & Plan Note (Signed)
Stable currently and well controlled on just wellbutrin. Counseled to be in touch if things worsen again so we can adjust accordingly. F/u in 6 months

## 2019-08-13 LAB — TSH: TSH: 8.17 u[IU]/mL — ABNORMAL HIGH (ref 0.450–4.500)

## 2019-08-14 ENCOUNTER — Telehealth: Payer: Self-pay | Admitting: Family Medicine

## 2019-08-14 DIAGNOSIS — E039 Hypothyroidism, unspecified: Secondary | ICD-10-CM

## 2019-08-14 NOTE — Telephone Encounter (Signed)
Called and LVM for patient to return my call. °

## 2019-08-14 NOTE — Telephone Encounter (Signed)
Please let him know that his thyroid came back sluggish - has he been taking his medication every day without missing days? If so, then we need to up the dose but if not we can just recheck things in about 6 weeks once he's on the medicine consistently

## 2019-08-18 MED ORDER — LEVOTHYROXINE SODIUM 25 MCG PO TABS: 25.0000 ug | ORAL_TABLET | Freq: Every day | ORAL | 0 refills | Status: DC

## 2019-08-18 NOTE — Telephone Encounter (Signed)
Called and discussed results with patient. Patient states he has been taking his medication every day. Confirmed pharmacy with patient to have increased dose sent in to.

## 2019-08-18 NOTE — Telephone Encounter (Signed)
Patient notified and verbalized understanding. States he will call back to schedule lab appointment.

## 2019-08-18 NOTE — Telephone Encounter (Signed)
Sent in 25 mcg tab to ad to his 200 mcg tab, lab re-ordered for 4-6 weeks out for recheck with lab visit

## 2019-09-09 ENCOUNTER — Encounter: Payer: Self-pay | Admitting: Nurse Practitioner

## 2019-09-09 ENCOUNTER — Other Ambulatory Visit: Payer: Self-pay

## 2019-09-09 ENCOUNTER — Ambulatory Visit (INDEPENDENT_AMBULATORY_CARE_PROVIDER_SITE_OTHER): Payer: Self-pay | Admitting: Nurse Practitioner

## 2019-09-09 DIAGNOSIS — H1010 Acute atopic conjunctivitis, unspecified eye: Secondary | ICD-10-CM | POA: Insufficient documentation

## 2019-09-09 DIAGNOSIS — H1011 Acute atopic conjunctivitis, right eye: Secondary | ICD-10-CM

## 2019-09-09 MED ORDER — OLOPATADINE HCL 0.1 % OP SOLN: 1.0000 [drp] | Freq: Two times a day (BID) | OPHTHALMIC | 1 refills | Status: DC

## 2019-09-09 NOTE — Patient Instructions (Addendum)
Take over the counter Claritin or Allegra -- one pill once daily.  Allergic Conjunctivitis A clear membrane (conjunctiva) covers the white part of your eye and the inner surface of your eyelid. Allergic conjunctivitis happens when this membrane has inflammation. This is caused by allergies. Common causes of allergic reactions (allergens) include:  Outdoor allergens, such as: ? Pollen. ? Grass and weeds. ? Mold spores.  Indoor allergens, such as: ? Dust. ? Smoke. ? Mold. ? Pet dander. ? Animal hair. This condition can make your eye red or pink. It can also make your eye feel itchy. This condition cannot be spread from one person to another person (is not contagious). Follow these instructions at home:  Try not to be around things that you are allergic to.  Take or apply over-the-counter and prescription medicines only as told by your doctor. These include any eye drops.  Place a cool, clean washcloth on your eye for 10-20 minutes. Do this 3-4 times a day.  Do not touch or rub your eyes.  Do not wear contact lenses until the inflammation is gone. Wear glasses instead.  Do not wear eye makeup until the inflammation is gone.  Keep all follow-up visits as told by your doctor. This is important. Contact a doctor if:  Your symptoms get worse.  Your symptoms do not get better with treatment.  You have mild eye pain.  You are sensitive to light,  You have spots or blisters on your eyes.  You have pus coming from your eye.  You have a fever. Get help right away if:  You have redness, swelling, or other symptoms in only one eye.  Your vision is blurry.  You have vision changes.  You have very bad eye pain. Summary  Allergic conjunctivitis is caused by allergies. It can make your eye red or pink, and it can make your eye feel itchy.  This condition cannot be spread from one person to another person (is not contagious).  Try not to be around things that you are  allergic to.  Take or apply over-the-counter and prescription medicines only as told by your doctor. These include any eye drops.  Contact your doctor if your symptoms get worse or they do not get better with treatment. This information is not intended to replace advice given to you by your health care provider. Make sure you discuss any questions you have with your health care provider. Document Revised: 05/07/2018 Document Reviewed: 09/10/2015 Elsevier Patient Education  2020 [employer].

## 2019-09-09 NOTE — Progress Notes (Signed)
BP 132/71 (BP Location: Left Arm, Cuff Size: Normal)   Pulse 98   Temp 98.2 F (36.8 C) (Oral)   Wt (!) 369 lb (167.4 kg)   SpO2 96%   BMI 51.47 kg/m    Subjective:    Patient ID: Joe Haley, male    DOB: 1990-05-04, 29 y.o.   MRN: [id number]  HPI: Joe Haley is a 29 y.o. male  Chief Complaint  Patient presents with  . Eye Burn    pt states his R eye has been burning, itchy and watery for the last few days    EYE PAIN Started 3 days ago with right eye burning.  Has been watery and irritated.   Duration:  days Involved eye:  right Onset: gradual Severity: mild  Quality: burning Foreign body sensation:no Visual impairment: no Eye redness: yes Discharge: no Crusting or matting of eyelids: yes Swelling: yes Photophobia: no Itching: yes Tearing: yes Headache: no Floaters: no URI symptoms: no Contact lens use: no Close contacts with similar problems: no Eye trauma: no Aggravating factors: none Alleviating factors: none Status: fluctuating Treatments attempted: none  Relevant past medical, surgical, family and social history reviewed and updated as indicated. Interim medical history since our last visit reviewed. Allergies and medications reviewed and updated.  Review of Systems  Constitutional: Negative for activity change, diaphoresis, fatigue and fever.  Eyes: Positive for redness and itching. Negative for photophobia, pain, discharge and visual disturbance.  Respiratory: Negative for cough, chest tightness, shortness of breath and wheezing.   Cardiovascular: Negative for chest pain, palpitations and leg swelling.  Gastrointestinal: Negative.   Neurological: Negative.   Psychiatric/Behavioral: Negative.     Per HPI unless specifically indicated above     Objective:    BP 132/71 (BP Location: Left Arm, Cuff Size: Normal)   Pulse 98   Temp 98.2 F (36.8 C) (Oral)   Wt (!) 369 lb (167.4 kg)   SpO2 96%   BMI 51.47 kg/m   Wt Readings  from Last 3 Encounters:  09/09/19 (!) 369 lb (167.4 kg)  08/12/19 (!) 368 lb (166.9 kg)  01/27/19 (!) 365 lb (165.6 kg)    Physical Exam Vitals and nursing note reviewed.  Constitutional:      General: He is awake. He is not in acute distress.    Appearance: He is well-developed and well-groomed. He is morbidly obese. He is not ill-appearing.  HENT:     Head: Normocephalic and atraumatic.     Right Ear: Hearing normal. No drainage.     Left Ear: Hearing normal. No drainage.  Eyes:     General: Lids are normal. Lids are everted, no foreign bodies appreciated. Vision grossly intact.        Right eye: No foreign body, discharge or hordeolum.        Left eye: No foreign body, discharge or hordeolum.     Extraocular Movements: Extraocular movements intact.     Conjunctiva/sclera:     Right eye: Right conjunctiva is injected. No exudate.    Left eye: Left conjunctiva is not injected. No exudate.    Pupils: Pupils are equal, round, and reactive to light.     Visual Fields: Right eye visual fields normal and left eye visual fields normal.     Comments: Tearing present right eye, no discharge.    Neck:     Thyroid: No thyromegaly.     Vascular: No carotid bruit or JVD.     Trachea: Trachea  normal.  Cardiovascular:     Rate and Rhythm: Normal rate and regular rhythm.     Heart sounds: Normal heart sounds, S1 normal and S2 normal. No murmur heard.  No gallop.   Pulmonary:     Effort: Pulmonary effort is normal.     Breath sounds: Normal breath sounds.  Abdominal:     General: Bowel sounds are normal.     Palpations: Abdomen is soft. There is no hepatomegaly or splenomegaly.  Musculoskeletal:        General: Normal range of motion.     Cervical back: Normal range of motion and neck supple.     Right lower leg: No edema.     Left lower leg: No edema.  Skin:    General: Skin is warm and dry.  Neurological:     Mental Status: He is alert and oriented to person, place, and time.    Psychiatric:        Attention and Perception: Attention normal.        Mood and Affect: Mood normal.        Speech: Speech normal.        Behavior: Behavior normal. Behavior is cooperative.        Thought Content: Thought content normal.     Results for orders placed or performed in visit on 08/12/19  TSH  Result Value Ref Range   TSH 8.170 (H) 0.450 - 4.500 uIU/mL      Assessment & Plan:   Problem List Items Addressed This Visit      Other   Allergic conjunctivitis    Acute x 3 days.  Vision intact.  Recommend taking OTC Claritin or Allegra daily.  Script sent for Pataday eye gtts.  Recommend cool compresses to eye for 10 minutes a few times a day.  Avoid rubbing eye.  Return to office for worsening or ongoing symptoms.          Follow up plan: Return if symptoms worsen or fail to improve.

## 2019-09-09 NOTE — Assessment & Plan Note (Signed)
Acute x 3 days.  Vision intact.  Recommend taking OTC Claritin or Allegra daily.  Script sent for Pataday eye gtts.  Recommend cool compresses to eye for 10 minutes a few times a day.  Avoid rubbing eye.  Return to office for worsening or ongoing symptoms.

## 2019-09-17 ENCOUNTER — Telehealth: Payer: Self-pay | Admitting: Licensed Clinical Social Worker

## 2019-09-17 ENCOUNTER — Telehealth: Payer: Self-pay

## 2019-09-17 NOTE — Telephone Encounter (Signed)
  Care Management    Clinical Social Work General Note 09/17/2019 Name: Joe Haley MRN: [id number] DOB: 1990-06-26  Joe Haley is a 29 y.o. year old male who is a primary care patient of Particia Nearing, [place]. The CCM team was consulted for assistance with Mental Health Counseling and Resources.   Review of patient status, including review of consultants reports, relevant laboratory and other test results, and collaboration with appropriate care team members and the patient's provider was performed as part of comprehensive patient evaluation and provision of chronic care management services.    LCSW completed CCM outreach attempt today but was unable to reach patient successfully. A HIPPA compliant voice message was left encouraging patient to return call once available if he is still interested in CCM Social Work Services.    Outpatient Encounter Medications as of 09/17/2019  Medication Sig  . buPROPion (WELLBUTRIN XL) 300 MG 24 hr tablet Take 1 tablet by mouth once daily  . levothyroxine (SYNTHROID) 200 MCG tablet Take 1 tablet (200 mcg total) by mouth daily.  Marland Kitchen levothyroxine (SYNTHROID) 25 MCG tablet Take 1 tablet (25 mcg total) by mouth daily before breakfast. Take with 200 mcg tablet to make 225 mcg dose daily  . olopatadine (PATADAY) 0.1 % ophthalmic solution Place 1 drop into the right eye 2 (two) times daily.   No facility-administered encounter medications on file as of 09/17/2019.    Follow Up Plan: Patient will contact PCP to gain additional referral to CCM Services due to 4 unsuccessful outreach attempts.    Dickie La, BSW, MSW, LCSW [facility] Care Management [place]  [facility] [email or web address] Phone: [phone]

## 2019-11-26 ENCOUNTER — Telehealth: Payer: Self-pay | Admitting: Family Medicine

## 2019-11-26 MED ORDER — LEVOTHYROXINE SODIUM 25 MCG PO TABS: 25.0000 ug | ORAL_TABLET | Freq: Every day | ORAL | 0 refills | Status: DC

## 2019-11-26 NOTE — Telephone Encounter (Signed)
Medication Refill - Medication: levothyroxine (SYNTHROID) 200 MCG tablet   Has the patient contacted their pharmacy? Yes.   (Agent: If no, request that the patient contact the pharmacy for the refill.) (Agent: If yes, when and what did the pharmacy advise?)  Preferred Pharmacy (with phone number or street name):  [facility] [address] [place] - [address]  [address] [place] [address]  Phone: [phone] Fax: [phone]     Agent: Please be advised that RX refills may take up to 3 business days. We ask that you follow-up with your pharmacy.

## 2019-11-26 NOTE — Telephone Encounter (Signed)
Previous RL patient. Last seen 08/12/19.

## 2019-11-26 NOTE — Telephone Encounter (Signed)
Unable to lvm to make this lab apt order in from 08/15/2019

## 2019-11-27 NOTE — Telephone Encounter (Signed)
Called pharmacy because 200 mg RX should still have a 90 day refill on file according to most recent RX. Pharmacy does have this on file and they are getting it ready for the patient. Will call and let him know.

## 2019-11-27 NOTE — Telephone Encounter (Signed)
Pt called in to follow up, pt says that he received a Rx for levothyroxine (SYNTHROID) 25 MCG tablet  But also need 200MG .  Pt has scheduled lab apt  Please assist.

## 2019-11-27 NOTE — Telephone Encounter (Signed)
Tried calling patient, no answer and no VM. Will try to call again later.  

## 2019-11-28 NOTE — Telephone Encounter (Signed)
Tried calling patient, no answer and no VM.  

## 2019-12-01 ENCOUNTER — Other Ambulatory Visit: Payer: Self-pay

## 2019-12-01 DIAGNOSIS — E039 Hypothyroidism, unspecified: Secondary | ICD-10-CM

## 2019-12-02 LAB — TSH: TSH: 1.18 u[IU]/mL (ref 0.450–4.500)

## 2019-12-02 NOTE — Progress Notes (Signed)
Contacted via MyChart  Good afternoon Joe Haley, thyroid labs have improved, continue current Levothyroxine dose.:)

## 2019-12-16 ENCOUNTER — Other Ambulatory Visit: Payer: Self-pay | Admitting: Family Medicine

## 2019-12-16 MED ORDER — LEVOTHYROXINE SODIUM 25 MCG PO TABS: 25.0000 ug | ORAL_TABLET | Freq: Every day | ORAL | 0 refills | Status: DC

## 2019-12-16 NOTE — Telephone Encounter (Signed)
Medication Refill - Medication: Levothyroxine   Has the patient contacted their pharmacy? Yes.   (Agent: If no, request that the patient contact the pharmacy for the refill.) (Agent: If yes, when and what did the pharmacy advise?)  Preferred Pharmacy (with phone number or street name):  [facility] [address], [place] - [address]  [address] [place] [address]  Phone: [phone] Fax: [phone]  Hours: Not open 24 hours     Agent: Please be advised that RX refills may take up to 3 business days. We ask that you follow-up with your pharmacy.

## 2020-01-28 ENCOUNTER — Other Ambulatory Visit: Payer: Self-pay | Admitting: Family Medicine

## 2020-01-28 ENCOUNTER — Other Ambulatory Visit: Payer: Self-pay | Admitting: Nurse Practitioner

## 2020-01-28 NOTE — Telephone Encounter (Signed)
Most recent TSH 1.180 on 12/01/19. Approved per protocol, Requested Prescriptions  Pending Prescriptions Disp Refills  . EUTHYROX 25 MCG tablet [Pharmacy Med Name: Euthyrox 25 MCG Oral Tablet] 30 tablet 0    Sig: TAKE 1 TABLET BY MOUTH ONCE DAILY BEFORE BREAKFAST .  TAKE  WITH  200  MCG  TABLET  TO  MAKE  225  MCG  DOSE  DAILY.     Endocrinology:  Hypothyroid Agents Failed - 01/28/2020 11:47 AM      Failed - TSH needs to be rechecked within 3 months after an abnormal result. Refill until TSH is due.      Passed - TSH in normal range and within 360 days    TSH  Date Value Ref Range Status  12/01/2019 1.180 0.450 - 4.500 uIU/mL Final         Passed - Valid encounter within last 12 months    Recent Outpatient Visits          4 months ago Allergic conjunctivitis of right eye   [facility] [place], Corrie Dandy T, NP   5 months ago Acquired hypothyroidism   [facility] [place] Maser [place], [place]   1 year ago Severe recurrent major depression without psychotic features [facility])   [facility] Particia Nearing, [place]   1 year ago Acquired hypothyroidism   [facility] Roosvelt Maser [place], [place]   1 year ago Severe recurrent major depression without psychotic features [facility])   [facility] Particia Nearing, [place]

## 2020-01-28 NOTE — Telephone Encounter (Signed)
Approved per protocol. Requested Prescriptions  Pending Prescriptions Disp Refills   buPROPion (WELLBUTRIN XL) 300 MG 24 hr tablet [Pharmacy Med Name: buPROPion HCl ER (XL) 300 MG Oral Tablet Extended Release 24 Hour] 90 tablet 0    Sig: Take 1 tablet by mouth once daily     Psychiatry: Antidepressants - bupropion Passed - 01/28/2020 11:47 AM      Passed - Completed PHQ-2 or PHQ-9 in the last 360 days      Passed - Last BP in normal range    BP Readings from Last 1 Encounters:  09/09/19 132/71         Passed - Valid encounter within last 6 months    Recent Outpatient Visits          4 months ago Allergic conjunctivitis of right eye   [facility] [place], Corrie Dandy T, NP   5 months ago Acquired hypothyroidism   [facility] Particia Nearing, [place]   1 year ago Severe recurrent major depression without psychotic features [facility])   [facility] Particia Nearing, [place]   1 year ago Acquired hypothyroidism   [facility] Roosvelt Maser [place], [place]   1 year ago Severe recurrent major depression without psychotic features [facility])   [facility] Particia Nearing, [place]

## 2020-03-01 ENCOUNTER — Other Ambulatory Visit: Payer: Self-pay | Admitting: Nurse Practitioner

## 2020-03-01 NOTE — Telephone Encounter (Signed)
Requested medications are due for refill today. Unknown  Requested medications are on the active medications list. Yes  Last refill. 08/12/2019 or 12/2019  Future visit scheduled.     Notes to clinic. Unsure of amount of medication Pt is to be taking. Please advise.

## 2020-03-01 NOTE — Telephone Encounter (Signed)
Medication Refill - Medication: EUTHYROX 25 MCG tablet  levothyroxine (SYNTHROID) 200 MCG tablet     Preferred Pharmacy (with phone number or street name):  [facility] [address] [place], [place] - [address] Phone:  [phone]  Fax:  [phone]       Agent: Please be advised that RX refills may take up to 3 business days. We ask that you follow-up with your pharmacy.

## 2020-03-02 NOTE — Telephone Encounter (Signed)
Good morning, sending this your way as you will be taking over care.  His last thyroid check was in November.

## 2020-03-02 NOTE — Telephone Encounter (Signed)
Patient takes of levothyroxine daily.  Can we find out if he needs both the and tabs?  Also, patient will be due for follow up in April.

## 2020-03-02 NOTE — Telephone Encounter (Signed)
Called and spoke with patient. He states that he does need both doses refilled. Follow up appointment scheduled for April.

## 2020-03-03 MED ORDER — LEVOTHYROXINE SODIUM 200 MCG PO TABS: 200.0000 ug | ORAL_TABLET | Freq: Every day | ORAL | 1 refills | Status: DC

## 2020-03-03 MED ORDER — LEVOTHYROXINE SODIUM 25 MCG PO TABS: 25.0000 ug | ORAL_TABLET | Freq: Every day | ORAL | 1 refills | Status: DC

## 2020-03-23 ENCOUNTER — Encounter: Payer: Self-pay | Admitting: Nurse Practitioner

## 2020-03-23 ENCOUNTER — Telehealth (INDEPENDENT_AMBULATORY_CARE_PROVIDER_SITE_OTHER): Payer: Self-pay | Admitting: Nurse Practitioner

## 2020-03-23 DIAGNOSIS — J3489 Other specified disorders of nose and nasal sinuses: Secondary | ICD-10-CM | POA: Insufficient documentation

## 2020-03-23 MED ORDER — PREDNISONE 20 MG PO TABS: 40.0000 mg | ORAL_TABLET | Freq: Every day | ORAL | 0 refills | Status: AC

## 2020-03-23 NOTE — Assessment & Plan Note (Signed)
Acute x 3 days.  Have highly recommend Covid and flu testing, refuses today due to cost.  Recommend he look into [facility] or home testing ASAP.  To self quarantine until symptoms have improved and highly recommend obtaining Covid vaccination.  At this time symptoms short period, no abx at this time.  Antibiotics don't work against viruses and just increase risk of other issues such as diarrhea, yeast infections, and resistant infections. Will send in script for Prednisone 40 MG x 5 days for sinus pressure and drainage + recommend: - Increased rest - Increasing Fluids - Acetaminophen / ibuprofen as needed for fever/pain.  - Salt water gargling, chloraseptic spray and throat lozenges - Mucinex.  - Saline sinus flushes or a neti pot.  - Humidifying the air Return to office for worsening or ongoing symptoms.

## 2020-03-23 NOTE — Progress Notes (Signed)
There were no vitals taken for this visit.   Subjective:    Patient ID: Joe Haley, male    DOB: April 03, 1990, 30 y.o.   MRN: [id number]  HPI: Joe Haley is a 30 y.o. male  Chief Complaint  Patient presents with  . Nasal Congestion    Patient states he has these current symptoms for the past four days and he has taken Robitussin. Patient states the first day he had some body aches and chills, denies fever. Pt states he does not think he has been exposed, but has not taken at-home test.   . Headache  . Sore Throat    . This visit was completed via MyChart due to the restrictions of the COVID-19 pandemic. All issues as above were discussed and addressed. Physical exam was done as above through visual confirmation on MyChart. If it was felt that the patient should be evaluated in the office, they were directed there. The patient verbally consented to this visit. . Location of the patient: home . Location of the provider: work . Those involved with this call:  . Provider: Aura Dials, DNP . CMA: Malen Gauze, CMA . Front Desk/Registration: Harriet Pho  . Time spent on call: 21 minutes with patient face to face via video conference. More than 50% of this time was spent in counseling and coordination of care. 15 minutes total spent in review of patient's record and preparation of their chart.  . I verified patient identity using two factors (patient name and date of birth). Patient consents verbally to being seen via telemedicine visit today.    UPPER RESPIRATORY TRACT INFECTION Symptoms started 3 days ago with headache and sore throat + body aches.  Has not been Covid vaccinated.  Denies loss of taste or smell.  Feels a little better today.   Fever: no, but has not had chills Cough: yes Shortness of breath: no Wheezing: no Chest pain: no Chest tightness: no Chest congestion: no Nasal congestion: yes Runny nose: yes Post nasal drip: yes Sneezing: no Sore  throat: yes Swollen glands: no Sinus pressure: yes Headache: yes Face pain: no Toothache: no Ear pain: none Ear pressure: none Eyes red/itching:no Eye drainage/crusting: no  Vomiting: no Rash: no Fatigue: yes Sick contacts: no Strep contacts: no  Context: fluctuating Recurrent sinusitis: no Relief with OTC cold/cough medications: yes  Treatments attempted: cough syrup   Relevant past medical, surgical, family and social history reviewed and updated as indicated. Interim medical history since our last visit reviewed. Allergies and medications reviewed and updated.  Review of Systems  Constitutional: Positive for chills and fatigue. Negative for activity change, appetite change and fever.  HENT: Positive for congestion, postnasal drip, rhinorrhea, sinus pressure and sore throat. Negative for ear discharge, ear pain, sinus pain, sneezing and voice change.   Respiratory: Positive for cough. Negative for chest tightness, shortness of breath and wheezing.   Cardiovascular: Negative.   Gastrointestinal: Positive for nausea. Negative for abdominal distention, abdominal pain, constipation, diarrhea and vomiting.  Neurological: Positive for headaches.  Psychiatric/Behavioral: Negative.     Per HPI unless specifically indicated above     Objective:    There were no vitals taken for this visit.  Wt Readings from Last 3 Encounters:  09/09/19 (!) 369 lb (167.4 kg)  08/12/19 (!) 368 lb (166.9 kg)  01/27/19 (!) 365 lb (165.6 kg)    Physical Exam Vitals and nursing note reviewed.  Constitutional:      General: He is  awake. He is not in acute distress.    Appearance: He is well-developed and well-groomed. He is obese. He is ill-appearing. He is not toxic-appearing.  HENT:     Head: Normocephalic.     Comments: Frequent sniffing and clearing throat during exam.    Right Ear: Hearing normal. No drainage.     Left Ear: Hearing normal. No drainage.  Eyes:     General: Lids are  normal.        Right eye: No discharge.        Left eye: No discharge.     Conjunctiva/sclera: Conjunctivae normal.  Pulmonary:     Effort: Pulmonary effort is normal. No accessory muscle usage or respiratory distress.  Musculoskeletal:     Cervical back: Normal range of motion.  Neurological:     Mental Status: He is alert and oriented to person, place, and time.  Psychiatric:        Mood and Affect: Mood normal.        Behavior: Behavior normal. Behavior is cooperative.        Thought Content: Thought content normal.        Judgment: Judgment normal.     Results for orders placed or performed in visit on 12/01/19  TSH  Result Value Ref Range   TSH 1.180 0.450 - 4.500 uIU/mL      Assessment & Plan:   Problem List Items Addressed This Visit      Other   Sinus drainage - Primary    Acute x 3 days.  Have highly recommend Covid and flu testing, refuses today due to cost.  Recommend he look into [facility] or home testing ASAP.  To self quarantine until symptoms have improved and highly recommend obtaining Covid vaccination.  At this time symptoms short period, no abx at this time.  Antibiotics don't work against viruses and just increase risk of other issues such as diarrhea, yeast infections, and resistant infections. Will send in script for Prednisone 40 MG x 5 days for sinus pressure and drainage + recommend: - Increased rest - Increasing Fluids - Acetaminophen / ibuprofen as needed for fever/pain.  - Salt water gargling, chloraseptic spray and throat lozenges - Mucinex.  - Saline sinus flushes or a neti pot.  - Humidifying the air Return to office for worsening or ongoing symptoms.         I discussed the assessment and treatment plan with the patient. The patient was provided an opportunity to ask questions and all were answered. The patient agreed with the plan and demonstrated an understanding of the instructions.   The patient was advised to call back or seek an  in-person evaluation if the symptoms worsen or if the condition fails to improve as anticipated.   I provided 21+ minutes of time during this encounter.  Follow up plan: Return if symptoms worsen or fail to improve.

## 2020-03-23 NOTE — Patient Instructions (Signed)

## 2020-05-02 ENCOUNTER — Other Ambulatory Visit: Payer: Self-pay | Admitting: Nurse Practitioner

## 2020-05-02 NOTE — Telephone Encounter (Signed)
Requested Prescriptions  Pending Prescriptions Disp Refills  . buPROPion (WELLBUTRIN XL) 300 MG 24 hr tablet [Pharmacy Med Name: buPROPion HCl ER (XL) 300 MG Oral Tablet Extended Release 24 Hour] 90 tablet 0    Sig: Take 1 tablet by mouth once daily     Psychiatry: Antidepressants - bupropion Passed - 05/02/2020 12:24 PM      Passed - Completed PHQ-2 or PHQ-9 in the last 360 days      Passed - Last BP in normal range    BP Readings from Last 1 Encounters:  09/09/19 132/71         Passed - Valid encounter within last 6 months    Recent Outpatient Visits          1 month ago Sinus drainage   [facility] [place], Corrie Dandy T, NP   7 months ago Allergic conjunctivitis of right eye   [facility] [place], Corrie Dandy T, NP   8 months ago Acquired hypothyroidism   [facility] Particia N[place], [place]   1 year ago Severe recurrent major depression without psychotic features [facility])   [facility] [place], [place]   1 year ago Acquired hypothyroidism   [facility] [place], [place]      Future Appointments            In 5 days Larae Grooms, NP [facility]

## 2020-05-06 NOTE — Progress Notes (Signed)
BP 138/87   Pulse 81   Temp 98.9 F (37.2 C)   Wt (!) 343 lb 8 oz (155.8 kg)   SpO2 96%   BMI 47.91 kg/m    Subjective:    Patient ID: Joe Haley, male    DOB: 12-10-1990, 30 y.o.   MRN: [id number]  HPI: Joe Haley is a 30 y.o. male  Chief Complaint  Patient presents with  . Anxiety   DEPRESSION/ANXIETY Patient states his anxiety has been worse due his brother being home lately and being more stressed over that.  Endorses suicidal thoughts.  Denies a plan.  Denies intentions to develop a plan.   [facility] Visit from 05/07/2020 in [place]  PHQ-9 Total Score 18     GAD 7 : Generalized Anxiety Score 05/07/2020 08/12/2019 01/27/2019 12/30/2018  Nervous, Anxious, on Edge _0 Control/stop worrying _1 Worry too much - different things _2 Trouble relaxing _3 Restless _4 Easily annoyed or irritable 0 0 1 1  Afraid - awful might happen _5 Total GAD 7 Score _6 Anxiety Difficulty Extremely difficult Somewhat difficult Very difficult Somewhat difficult     HYPOTHYROIDISM Thyroid control status:stable Satisfied with current treatment? yes Medication side effects: no Medication compliance: excellent compliance Etiology of hypothyroidism:  Recent dose adjustment:no Fatigue: no Cold intolerance: no Heat intolerance: no Weight gain: no Weight loss: no Constipation: no Diarrhea/loose stools: no Palpitations: no Lower extremity edema: no Anxiety/depressed mood: yes   Patient states that he has been having a head cold.  This has been on going for several weeks. He has headaches, congestion, sore throat.  No SOB.  Relevant past medical, surgical, family and social history reviewed and updated as indicated. Interim medical history since our last visit reviewed. Allergies and medications reviewed and updated.  Review of Systems  Constitutional: Negative for fatigue and unexpected weight  change.  Eyes: Negative for visual disturbance.  Respiratory: Negative for shortness of breath.   Cardiovascular: Negative for chest pain and leg swelling.  Gastrointestinal: Negative for constipation and diarrhea.  Endocrine: Negative for cold intolerance and heat intolerance.  Neurological: Negative for light-headedness and headaches.  Psychiatric/Behavioral: Positive for dysphoric mood and suicidal ideas. The patient is nervous/anxious.     Per HPI unless specifically indicated above     Objective:    BP 138/87   Pulse 81   Temp 98.9 F (37.2 C)   Wt (!) 343 lb 8 oz (155.8 kg)   SpO2 96%   BMI 47.91 kg/m   Wt Readings from Last 3 Encounters:  05/07/20 (!) 343 lb 8 oz (155.8 kg)  09/09/19 (!) 369 lb (167.4 kg)  08/12/19 (!) 368 lb (166.9 kg)    Physical Exam Vitals and nursing note reviewed.  Constitutional:      General: He is not in acute distress.    Appearance: Normal appearance. He is obese. He is not ill-appearing, toxic-appearing or diaphoretic.  HENT:     Head: Normocephalic.     Right Ear: External ear normal.     Left Ear: External ear normal.     Nose: Nose normal. No congestion or rhinorrhea.     Mouth/Throat:     Mouth: Mucous membranes are moist.  Eyes:     General:        Right eye: No  discharge.        Left eye: No discharge.     Extraocular Movements: Extraocular movements intact.     Conjunctiva/sclera: Conjunctivae normal.     Pupils: Pupils are equal, round, and reactive to light.  Cardiovascular:     Rate and Rhythm: Normal rate and regular rhythm.     Heart sounds: No murmur heard.   Pulmonary:     Effort: Pulmonary effort is normal. No respiratory distress.     Breath sounds: Normal breath sounds. No wheezing, rhonchi or rales.  Abdominal:     General: Abdomen is flat. Bowel sounds are normal.  Musculoskeletal:     Cervical back: Normal range of motion and neck supple.  Skin:    General: Skin is warm and dry.     Capillary Refill:  Capillary refill takes less than 2 seconds.  Neurological:     General: No focal deficit present.     Mental Status: He is alert and oriented to person, place, and time.  Psychiatric:        Mood and Affect: Mood normal.        Behavior: Behavior normal.        Thought Content: Thought content normal.        Judgment: Judgment normal.     Results for orders placed or performed in visit on 12/01/19  TSH  Result Value Ref Range   TSH 1.180 0.450 - 4.500 uIU/mL      Assessment & Plan:   Problem List Items Addressed This Visit      Endocrine   Acquired hypothyroidism    Recheck TSH, adjust as needed. Continue current regimen.      Relevant Orders   TSH     Other   Severe recurrent major depression without psychotic features ([place]) - Primary    Chronic.  Uncontrolled.  Begin Buspar TID to help with anxiety. Can consider increasing Wellbutrin to 424m or a low dose os Seroquel (XL) at bedtime. Patient declined referral to Psychiatry because it didn't help him in the past. Return in 1 month for reevaluation.      Relevant Medications   busPIRone (BUSPAR) 5 MG tablet   Other Relevant Orders   Comp Met (CMET)   GAD (generalized anxiety disorder)    Chronic.  Uncontrolled.  Begin Buspar TID to help with anxiety. Can consider increasing Wellbutrin to 4510mor a low dose os Seroquel (XL) at bedtime. Patient declined referral to Psychiatry because it didn't help him in the past. Return in 1 month for reevaluation.      Relevant Medications   busPIRone (BUSPAR) 5 MG tablet   Other Relevant Orders   Comp Met (CMET)   Hyperlipidemia   Relevant Orders   Comp Met (CMET)   Lipid Profile   Sinus drainage    Complete Z-pak.  Recommend OTC allergy medication to help manage symptoms.          Follow up plan: Return in about 1 month (around 06/06/2020) for Depression/Anxiety FU, thyroid check .

## 2020-05-07 ENCOUNTER — Encounter: Payer: Self-pay | Admitting: Nurse Practitioner

## 2020-05-07 ENCOUNTER — Ambulatory Visit (INDEPENDENT_AMBULATORY_CARE_PROVIDER_SITE_OTHER): Payer: Self-pay | Admitting: Nurse Practitioner

## 2020-05-07 ENCOUNTER — Other Ambulatory Visit: Payer: Self-pay

## 2020-05-07 VITALS — BP 138/87 | HR 81 | Temp 98.9°F | Wt 343.5 lb

## 2020-05-07 DIAGNOSIS — F332 Major depressive disorder, recurrent severe without psychotic features: Secondary | ICD-10-CM

## 2020-05-07 DIAGNOSIS — J3489 Other specified disorders of nose and nasal sinuses: Secondary | ICD-10-CM

## 2020-05-07 DIAGNOSIS — F411 Generalized anxiety disorder: Secondary | ICD-10-CM

## 2020-05-07 DIAGNOSIS — E782 Mixed hyperlipidemia: Secondary | ICD-10-CM

## 2020-05-07 DIAGNOSIS — E039 Hypothyroidism, unspecified: Secondary | ICD-10-CM

## 2020-05-07 MED ORDER — AZITHROMYCIN 250 MG PO TABS: ORAL_TABLET | ORAL | 0 refills | Status: DC

## 2020-05-07 MED ORDER — BUSPIRONE HCL 5 MG PO TABS: 5.0000 mg | ORAL_TABLET | Freq: Three times a day (TID) | ORAL | 0 refills | Status: DC

## 2020-05-07 NOTE — Assessment & Plan Note (Signed)
Complete Z-pak.  Recommend OTC allergy medication to help manage symptoms.

## 2020-05-07 NOTE — Assessment & Plan Note (Signed)
Chronic.  Uncontrolled.  Begin Buspar TID to help with anxiety. Can consider increasing Wellbutrin to 450mg  or a low dose os Seroquel (XL) at bedtime. Patient declined referral to Psychiatry because it didn't help him in the past. Return in 1 month for reevaluation.

## 2020-05-07 NOTE — Assessment & Plan Note (Signed)
Recheck TSH, adjust as needed. Continue current regimen 

## 2020-05-07 NOTE — Assessment & Plan Note (Signed)
Chronic.  Uncontrolled.  Begin Buspar TID to help with anxiety. Can consider increasing Wellbutrin to 450mg or a low dose os Seroquel (XL) at bedtime. Patient declined referral to Psychiatry because it didn't help him in the past. Return in 1 month for reevaluation. 

## 2020-05-08 LAB — COMPREHENSIVE METABOLIC PANEL
ALT: 38 IU/L (ref 0–44)
AST: 29 IU/L (ref 0–40)
Albumin/Globulin Ratio: 1.6 (ref 1.2–2.2)
Albumin: 4.7 g/dL (ref 4.1–5.2)
Alkaline Phosphatase: 99 IU/L (ref 44–121)
BUN/Creatinine Ratio: 12 (ref 9–20)
BUN: 10 mg/dL (ref 6–20)
Bilirubin Total: 1.1 mg/dL (ref 0.0–1.2)
CO2: 18 mmol/L — ABNORMAL LOW (ref 20–29)
Calcium: 9.8 mg/dL (ref 8.7–10.2)
Chloride: 103 mmol/L (ref 96–106)
Creatinine, Ser: 0.83 mg/dL (ref 0.76–1.27)
Globulin, Total: 2.9 g/dL (ref 1.5–4.5)
Glucose: 79 mg/dL (ref 65–99)
Potassium: 4.7 mmol/L (ref 3.5–5.2)
Sodium: 140 mmol/L (ref 134–144)
Total Protein: 7.6 g/dL (ref 6.0–8.5)
eGFR: 122 mL/min/{1.73_m2} (ref >59)

## 2020-05-08 LAB — TSH: TSH: 0.073 u[IU]/mL — ABNORMAL LOW (ref 0.450–4.500)

## 2020-05-08 LAB — LIPID PANEL
Chol/HDL Ratio: 4.7 ratio (ref 0.0–5.0)
Cholesterol, Total: 150 mg/dL (ref 100–199)
HDL: 32 mg/dL — ABNORMAL LOW (ref >39)
LDL Chol Calc (NIH): 89 mg/dL (ref 0–99)
Triglycerides: 163 mg/dL — ABNORMAL HIGH (ref 0–149)
VLDL Cholesterol Cal: 29 mg/dL (ref 5–40)

## 2020-05-10 NOTE — Progress Notes (Signed)
Please call patient and let him know that his lab work shows he is receiving too much of the thyroid hormone.  This could be contributing to him being unable to sit still. He should stop taking the additional of levothyroxine and only take . We will recheck his lab work at his next visit. Otherwise, the rest of his lab work looks good. Triglycerides are elevated.  Recommend decreasing processed foods and exercising for at least 30 mins 5x weekly.  Please let me know if he has any questions.

## 2020-06-09 ENCOUNTER — Other Ambulatory Visit: Payer: Self-pay

## 2020-06-09 ENCOUNTER — Ambulatory Visit (INDEPENDENT_AMBULATORY_CARE_PROVIDER_SITE_OTHER): Payer: Self-pay | Admitting: Nurse Practitioner

## 2020-06-09 ENCOUNTER — Encounter: Payer: Self-pay | Admitting: Nurse Practitioner

## 2020-06-09 VITALS — BP 126/84 | HR 87 | Temp 98.4°F | Wt 350.0 lb

## 2020-06-09 DIAGNOSIS — F411 Generalized anxiety disorder: Secondary | ICD-10-CM

## 2020-06-09 DIAGNOSIS — F332 Major depressive disorder, recurrent severe without psychotic features: Secondary | ICD-10-CM

## 2020-06-09 DIAGNOSIS — E039 Hypothyroidism, unspecified: Secondary | ICD-10-CM

## 2020-06-09 MED ORDER — BUSPIRONE HCL 15 MG PO TABS: 15.0000 mg | ORAL_TABLET | Freq: Three times a day (TID) | ORAL | 0 refills | Status: DC

## 2020-06-09 NOTE — Progress Notes (Signed)
BP 126/84   Pulse 87   Temp 98.4 F (36.9 C)   Wt (!) 350 lb (158.8 kg)   SpO2 98%   BMI 48.82 kg/m    Subjective:    Patient ID: Joe Haley, male    DOB: 25-Jun-1990, 30 y.o.   MRN: [id number]  HPI: Clayborne Divis is a 30 y.o. male  Chief Complaint  Patient presents with  . Anxiety   DEPRESSION/ANXIETY Patient states he was was taking the Buspar after his last appointment but then started to forget and didn't really notice a difference with the medication so he stopped it all together. Patient is feeling better at visit today.  Denies SI.    HYPOTHYROIDISM Thyroid control status:better Satisfied with current treatment? yes Medication side effects: no Medication compliance: excellent compliance Etiology of hypothyroidism:  Recent dose adjustment:yes Fatigue: no Cold intolerance: no Heat intolerance: no Weight gain: no Weight loss: no Constipation: no Diarrhea/loose stools: no Palpitations: no Lower extremity edema: no Anxiety/depressed mood: yes  Relevant past medical, surgical, family and social history reviewed and updated as indicated. Interim medical history since our last visit reviewed. Allergies and medications reviewed and updated.  Review of Systems  Constitutional: Negative for fatigue and unexpected weight change.  Cardiovascular: Negative for palpitations and leg swelling.  Gastrointestinal: Negative for constipation and diarrhea.  Endocrine: Negative for cold intolerance and heat intolerance.  Psychiatric/Behavioral: Positive for dysphoric mood. Negative for suicidal ideas. The patient is nervous/anxious.     Per HPI unless specifically indicated above     Objective:    BP 126/84   Pulse 87   Temp 98.4 F (36.9 C)   Wt (!) 350 lb (158.8 kg)   SpO2 98%   BMI 48.82 kg/m   Wt Readings from Last 3 Encounters:  06/09/20 (!) 350 lb (158.8 kg)  05/07/20 (!) 343 lb 8 oz (155.8 kg)  09/09/19 (!) 369 lb (167.4 kg)    Physical  Exam Vitals and nursing note reviewed.  Constitutional:      General: He is not in acute distress.    Appearance: Normal appearance. He is obese. He is not ill-appearing, toxic-appearing or diaphoretic.  HENT:     Head: Normocephalic.     Right Ear: External ear normal.     Left Ear: External ear normal.     Nose: Nose normal. No congestion or rhinorrhea.     Mouth/Throat:     Mouth: Mucous membranes are moist.  Eyes:     General:        Right eye: No discharge.        Left eye: No discharge.     Extraocular Movements: Extraocular movements intact.     Conjunctiva/sclera: Conjunctivae normal.     Pupils: Pupils are equal, round, and reactive to light.  Cardiovascular:     Rate and Rhythm: Normal rate and regular rhythm.     Heart sounds: No murmur heard.   Pulmonary:     Effort: Pulmonary effort is normal. No respiratory distress.     Breath sounds: Normal breath sounds. No wheezing, rhonchi or rales.  Abdominal:     General: Abdomen is flat. Bowel sounds are normal.  Musculoskeletal:     Cervical back: Normal range of motion and neck supple.  Skin:    General: Skin is warm and dry.     Capillary Refill: Capillary refill takes less than 2 seconds.  Neurological:     General: No focal deficit present.  Mental Status: He is alert and oriented to person, place, and time.  Psychiatric:        Mood and Affect: Mood normal.        Behavior: Behavior normal.        Thought Content: Thought content normal.        Judgment: Judgment normal.     Results for orders placed or performed in visit on 05/07/20  TSH  Result Value Ref Range   TSH 0.073 (L) 0.450 - 4.500 uIU/mL  Comp Met (CMET)  Result Value Ref Range   Glucose 79 65 - 99 mg/dL   BUN 10 6 - 20 mg/dL   Creatinine, Ser 0.83 0.76 - 1.27 mg/dL   eGFR 122 >59 mL/min/1.73   BUN/Creatinine Ratio 12 9 - 20   Sodium 140 134 - 144 mmol/L   Potassium 4.7 3.5 - 5.2 mmol/L   Chloride 103 96 - 106 mmol/L   CO2 18 (L) 20  - 29 mmol/L   Calcium 9.8 8.7 - 10.2 mg/dL   Total Protein 7.6 6.0 - 8.5 g/dL   Albumin 4.7 4.1 - 5.2 g/dL   Globulin, Total 2.9 1.5 - 4.5 g/dL   Albumin/Globulin Ratio 1.6 1.2 - 2.2   Bilirubin Total 1.1 0.0 - 1.2 mg/dL   Alkaline Phosphatase 99 44 - 121 IU/L   AST 29 0 - 40 IU/L   ALT 38 0 - 44 IU/L  Lipid Profile  Result Value Ref Range   Cholesterol, Total 150 100 - 199 mg/dL   Triglycerides 163 (H) 0 - 149 mg/dL   HDL 32 (L) >39 mg/dL   VLDL Cholesterol Cal 29 5 - 40 mg/dL   LDL Chol Calc (NIH) 89 0 - 99 mg/dL   Chol/HDL Ratio 4.7 0.0 - 5.0 ratio      Assessment & Plan:   Problem List Items Addressed This Visit      Endocrine   Acquired hypothyroidism    Hyperthyroid on last visit, dose was adjusted to levothyroxine 275mg. Labs ordered today. Will make recommendations based on lab results.       Relevant Orders   TSH     Other   [place]    Chronic.  Ongoing.  Scores improved from last visit, Likely due to thyroid dose being lowered. Patient feels like he would better with something PRN for anxiety.  Does not want Wellbutrin adjusted. Denies SI.  Follow up in 3 months.       Relevant Medications   busPIRone (BUSPAR) 15 MG tablet   GAD (generalized anxiety disorder)    Chronic.  Ongoing.  Scores improved from last visit, Likely due to thyroid dose being lowered. Patient feels like he would better with something PRN for anxiety.  Does not want Wellbutrin adjusted. Denies SI.  Follow up in 3 months.       Relevant Medications   busPIRone (BUSPAR) 15 MG tablet       Follow up plan: Return in about 3 months (around 09/09/2020) for Depression/Anxiety FU, thyroid check.

## 2020-06-09 NOTE — Assessment & Plan Note (Signed)
Hyperthyroid on last visit, dose was adjusted to levothyroxine . Labs ordered today. Will make recommendations based on lab results.

## 2020-06-09 NOTE — Assessment & Plan Note (Signed)
Chronic.  Ongoing.  Scores improved from last visit, Likely due to thyroid dose being lowered. Patient feels like he would better with something PRN for anxiety.  Does not want Wellbutrin adjusted. Denies SI.  Follow up in 3 months.

## 2020-06-09 NOTE — Assessment & Plan Note (Signed)
Chronic.  Ongoing.  Scores improved from last visit, Likely due to thyroid dose being lowered. Patient feels like he would better with something PRN for anxiety.  Does not want Wellbutrin adjusted. Denies SI.  Follow up in 3 months.  

## 2020-06-10 LAB — TSH: TSH: 0.189 u[IU]/mL — ABNORMAL LOW (ref 0.450–4.500)

## 2020-06-10 NOTE — Addendum Note (Signed)
Addended by: Larae Grooms on: 06/10/2020 09:35 AM   Modules accepted: Orders

## 2020-06-10 NOTE — Progress Notes (Signed)
Hi Joe Haley.  Your Thyroid labs are improving.  I would like to recheck this in one month to make sure it is back within normal range. I have placed a lab order for you to come in for a lab check.

## 2020-08-03 ENCOUNTER — Other Ambulatory Visit: Payer: Self-pay | Admitting: Nurse Practitioner

## 2020-09-11 ENCOUNTER — Other Ambulatory Visit: Payer: Self-pay | Admitting: Nurse Practitioner

## 2020-09-11 NOTE — Telephone Encounter (Signed)
Requested medication (s) are due for refill today: yes  Requested medication (s) are on the active medication list: yes  Last refill:  03/03/20 #90 1 Rf  Future visit scheduled: no  Notes to clinic:  called pt and LM to call office to schedule appt for refills   Requested Prescriptions  Pending Prescriptions Disp Refills   EUTHYROX 200 MCG tablet [Pharmacy Med Name: Euthyrox 200 MCG Oral Tablet] 90 tablet 0    Sig: Take 1 tablet by mouth once daily     Endocrinology:  Hypothyroid Agents Failed - 09/11/2020  9:42 AM      Failed - TSH needs to be rechecked within 3 months after an abnormal result. Refill until TSH is due.      Failed - TSH in normal range and within 360 days    TSH  Date Value Ref Range Status  06/09/2020 0.189 (L) 0.450 - 4.500 uIU/mL Final          Passed - Valid encounter within last 12 months    Recent Outpatient Visits           3 months ago Severe recurrent major depression without psychotic features ([facility])   [facility] Larae Grooms, NP   4 months ago Severe recurrent major depression without psychotic features ([facility])   [facility] Larae Grooms, NP   5 months ago Sinus drainage   [facility] [place], Corrie Dandy T, NP   1 year ago Allergic conjunctivitis of right eye   [facility] Marjie Skiff, NP   1 year ago Acquired hypothyroidism   [facility] Roosvelt Maser [place], [place]

## 2020-09-13 NOTE — Telephone Encounter (Signed)
Patient is due for follow up appointment. Please call to schedule.

## 2020-10-15 ENCOUNTER — Other Ambulatory Visit: Payer: Self-pay | Admitting: Nurse Practitioner

## 2020-10-15 NOTE — Telephone Encounter (Signed)
Requested medication (s) are due for refill today - yes  Requested medication (s) are on the active medication list -yes  Future visit scheduled -no  Last refill: 09/13/20  Notes to clinic: Attempted to call patient to schedule lab repeat- left message on VM to call office-patient has had courtesy refill- sent to office for review   Requested Prescriptions  Pending Prescriptions Disp Refills   EUTHYROX 200 MCG tablet [Pharmacy Med Name: Euthyrox 200 MCG Oral Tablet] 30 tablet 0    Sig: Take 1 tablet by mouth daily     Endocrinology:  Hypothyroid Agents Failed - 10/15/2020  8:35 AM      Failed - TSH needs to be rechecked within 3 months after an abnormal result. Refill until TSH is due.      Failed - TSH in normal range and within 360 days    TSH  Date Value Ref Range Status  06/09/2020 0.189 (L) 0.450 - 4.500 uIU/mL Final          Passed - Valid encounter within last 12 months    Recent Outpatient Visits           4 months ago Severe recurrent major depression without psychotic features (HCC)   [facility] Larae Grooms, NP   5 months ago Severe recurrent major depression without psychotic features (HCC)   [facility] Larae Grooms, NP   6 months ago Sinus drainage   [facility] [place], Corrie Dandy T, NP   1 year ago Allergic conjunctivitis of right eye   [facility] [place], Corrie Dandy T, NP   1 year ago Acquired hypothyroidism   [facility] Particia Nearing, [place]                 Requested Prescriptions  Pending Prescriptions Disp Refills   EUTHYROX 200 MCG tablet [Pharmacy Med Name: Euthyrox 200 MCG Oral Tablet] 30 tablet 0    Sig: Take 1 tablet by mouth daily     Endocrinology:  Hypothyroid Agents Failed - 10/15/2020  8:35 AM      Failed - TSH needs to be rechecked within 3 months after an abnormal result. Refill until TSH is due.      Failed - TSH in normal range and within 360 days     TSH  Date Value Ref Range Status  06/09/2020 0.189 (L) 0.450 - 4.500 uIU/mL Final          Passed - Valid encounter within last 12 months    Recent Outpatient Visits           4 months ago Severe recurrent major depression without psychotic features (HCC)   [facility] Larae Grooms, NP   5 months ago Severe recurrent major depression without psychotic features (HCC)   [facility] Larae Grooms, NP   6 months ago Sinus drainage   [facility] [place], Corrie Dandy T, NP   1 year ago Allergic conjunctivitis of right eye   [facility] Marjie Skiff, NP   1 year ago Acquired hypothyroidism   [facility] [place] Roosvelt Maser [place], [place]

## 2020-10-19 NOTE — Telephone Encounter (Signed)
2 week supply sent for patient to make an appointment.

## 2020-10-20 NOTE — Telephone Encounter (Signed)
Lmom for patient to return call to schedule follow up.

## 2020-11-15 ENCOUNTER — Other Ambulatory Visit: Payer: Self-pay | Admitting: Nurse Practitioner

## 2020-11-16 NOTE — Telephone Encounter (Signed)
Requested medications are due for refill today yes  Requested medications are on the active medication list yes  Last refill given 15 day supply 10/20/20  Last visit 05/2020 was asked to return in 3 months  Future visit scheduled NO, already had a curtesy refill, no upcoming appt scheduled.  Notes to clinic Has already had a curtesy refill and there is no upcoming appointment scheduled.

## 2020-11-16 NOTE — Telephone Encounter (Signed)
Patient needs an appoint for further refills.

## 2020-11-16 NOTE — Telephone Encounter (Signed)
Patient needs an appoint for further refills. 

## 2020-11-16 NOTE — Telephone Encounter (Signed)
Lmom for patient to schedule appointment. 

## 2020-11-16 NOTE — Telephone Encounter (Signed)
Requested medications are due for refill today yes  Requested medications are on the active medication list yes  Last refill 08/03/20  Last visit 06/09/20  Future visit scheduled No  Notes to clinic Was asked to return when last rx refilled, no upcoming visit scheduled, please assess.  Requested Prescriptions  Pending Prescriptions Disp Refills   buPROPion (WELLBUTRIN XL) 300 MG 24 hr tablet [Pharmacy Med Name: buPROPion HCl ER (XL) 300 MG Oral Tablet Extended Release 24 Hour] 90 tablet 0    Sig: TAKE 1 TABLET BY MOUTH ONCE DAILY (PLEASE  SCHEDULE  FOLLOW-UP  APPOINTMENT)     Psychiatry: Antidepressants - bupropion Passed - 11/15/2020  3:52 PM      Passed - Completed PHQ-2 or PHQ-9 in the last 360 days      Passed - Last BP in normal range    BP Readings from Last 1 Encounters:  06/09/20 126/84          Passed - Valid encounter within last 6 months    Recent Outpatient Visits           5 months ago Severe recurrent major depression without psychotic features (HCC)   [facility] Larae Grooms, NP   6 months ago Severe recurrent major depression without psychotic features (HCC)   [facility] Larae Grooms, NP   7 months ago Sinus drainage   [facility] [place], Corrie Dandy T, NP   1 year ago Allergic conjunctivitis of right eye   [facility] Marjie Skiff, NP   1 year ago Acquired hypothyroidism   [facility] Particia Nearing, [place]

## 2020-12-15 ENCOUNTER — Telehealth: Payer: Self-pay | Admitting: Nurse Practitioner

## 2020-12-15 NOTE — Telephone Encounter (Signed)
Requested medication (s) are due for refill today:   yes for both  Requested medication (s) are on the active medication list:   Yes for both  Future visit scheduled:   No.   There have been several voicemails left to call in for an appt and a MyChart message sent to call in for an appt for further refills as well with no response.    Last ordered: 11/16/2020 #30, 0 refills for both.  Returned because pt not responding to notifications to call in for an appt.   Provider to review for refills.   Requested Prescriptions  Pending Prescriptions Disp Refills   levothyroxine (SYNTHROID) 200 MCG tablet [Pharmacy Med Name: Levothyroxine Sodium 200 MCG Oral Tablet] 30 tablet 0    Sig: Take 1 tablet by mouth once daily     Endocrinology:  Hypothyroid Agents Failed - 12/15/2020  7:34 AM      Failed - TSH needs to be rechecked within 3 months after an abnormal result. Refill until TSH is due.      Failed - TSH in normal range and within 360 days    TSH  Date Value Ref Range Status  06/09/2020 0.189 (L) 0.450 - 4.500 uIU/mL Final          Passed - Valid encounter within last 12 months    Recent Outpatient Visits           6 months ago Severe recurrent major depression without psychotic features (HCC)   [facility] Larae Grooms, NP   7 months ago Severe recurrent major depression without psychotic features (HCC)   [facility] Larae Grooms, NP   8 months ago Sinus drainage   [facility] [place], Corrie Dandy T, NP   1 year ago Allergic conjunctivitis of right eye   [facility] [place], Corrie Dandy T, NP   1 year ago Acquired hypothyroidism   [facility] Particia Nearing, [place]               buPROPion (WELLBUTRIN XL) 300 MG 24 hr tablet [Pharmacy Med Name: buPROPion HCl ER (XL) 300 MG Oral Tablet Extended Release 24 Hour] 30 tablet 0    Sig: TAKE 1 TABLET BY MOUTH ONCE DAILY . APPOINTMENT REQUIRED FOR FUTURE REFILLS      Psychiatry: Antidepressants - bupropion Failed - 12/15/2020  7:34 AM      Failed - Valid encounter within last 6 months    Recent Outpatient Visits           6 months ago Severe recurrent major depression without psychotic features (HCC)   [facility] Larae Grooms, NP   7 months ago Severe recurrent major depression without psychotic features (HCC)   [facility] Larae Grooms, NP   8 months ago Sinus drainage   [facility] [place], Corrie Dandy T, NP   1 year ago Allergic conjunctivitis of right eye   [facility] [place], Corrie Dandy T, NP   1 year ago Acquired hypothyroidism   [facility] Roosvelt Maser [place], [place]              Passed - Completed PHQ-2 or PHQ-9 in the last 360 days      Passed - Last BP in normal range    BP Readings from Last 1 Encounters:  06/09/20 126/84

## 2020-12-20 NOTE — Telephone Encounter (Signed)
Pt called in to follow up on his refill request. Pt is scheduled for 01/04/21. Pt would like to know if he can receive his Rx until then?  Please advise.

## 2020-12-21 MED ORDER — LEVOTHYROXINE SODIUM 200 MCG PO TABS: 200.0000 ug | ORAL_TABLET | Freq: Every day | ORAL | 0 refills | Status: DC

## 2020-12-21 MED ORDER — BUPROPION HCL ER (XL) 300 MG PO TB24: ORAL_TABLET | ORAL | 0 refills | Status: DC

## 2020-12-21 NOTE — Addendum Note (Signed)
Addended by: Rodman Pickle A on: 12/21/2020 03:51 PM   Modules accepted: Orders

## 2021-01-04 ENCOUNTER — Other Ambulatory Visit: Payer: Self-pay

## 2021-01-04 ENCOUNTER — Ambulatory Visit (INDEPENDENT_AMBULATORY_CARE_PROVIDER_SITE_OTHER): Payer: Self-pay | Admitting: Nurse Practitioner

## 2021-01-04 ENCOUNTER — Other Ambulatory Visit: Payer: Self-pay | Admitting: Nurse Practitioner

## 2021-01-04 ENCOUNTER — Encounter: Payer: Self-pay | Admitting: Nurse Practitioner

## 2021-01-04 VITALS — BP 137/79 | HR 94 | Temp 98.3°F | Ht 70.0 in | Wt 350.0 lb

## 2021-01-04 DIAGNOSIS — E039 Hypothyroidism, unspecified: Secondary | ICD-10-CM

## 2021-01-04 DIAGNOSIS — F332 Major depressive disorder, recurrent severe without psychotic features: Secondary | ICD-10-CM

## 2021-01-04 DIAGNOSIS — F411 Generalized anxiety disorder: Secondary | ICD-10-CM

## 2021-01-04 DIAGNOSIS — L700 Acne vulgaris: Secondary | ICD-10-CM

## 2021-01-04 MED ORDER — BENZOYL PEROXIDE WASH 5 % EX LIQD: Freq: Two times a day (BID) | CUTANEOUS | 0 refills | Status: DC

## 2021-01-04 NOTE — Progress Notes (Addendum)
BP 137/79   Pulse 94   Temp 98.3 F (36.8 C) (Oral)   Ht '5\' 10"'  (1.778 m)   Wt (!) 350 lb (158.8 kg)   SpO2 96%   BMI 50.22 kg/m    Subjective:    Patient ID: Joe Haley, male    DOB: 1990/03/14, 30 y.o.   MRN: [id number]  HPI: Joe Haley is a 30 y.o. male  Chief Complaint  Patient presents with   Medication Refill   Depression    Patient has blue/purple spots on bottom that are associated with pain for 6 years.    Hypothyroidism   DEPRESSION/ANXIETY Patient states he doesn't think he has been taking the Buspar.  Patient states he feels like his depression and anxiety are a little bit better recently.  He would like to stay with the Wellbutrin 353m daily.   [place] [place] PHQ-9 Total Score 10      GAD 7 : Generalized Anxiety Score 01/04/2021 06/09/2020 05/07/2020 08/12/2019  Nervous, Anxious, on Edge '1 2 3 2  ' Control/stop worrying '1 1 3 1  ' Worry too much - different things '1 1 3 1  ' Trouble relaxing '1 2 3 1  ' Restless '1 1 3 1  ' Easily annoyed or irritable 1 2 0 0  Afraid - awful might happen '1 2 1 1  ' Total GAD 7 Score '7 11 16 7  ' Anxiety Difficulty - Somewhat difficult Extremely difficult Somewhat difficult      HYPOTHYROIDISM Patient states he hasn't been out of his medication.  He is not sure of the dose.   Thyroid control status:better Satisfied with current treatment? yes Medication side effects: no Medication compliance: excellent compliance Etiology of hypothyroidism:  Recent dose adjustment:yes Fatigue: no Cold intolerance: no Heat intolerance: no Weight gain: no Weight loss: no Constipation: no Diarrhea/loose stools: yes- some diarrhea Palpitations: no Lower extremity edema: no Anxiety/depressed mood: yes  Patient states he has been having purple/bluish spots on his back side that are always there.  States they have pain at times but it comes and goes. The pain is is a 3/10  stabbing pain.  The spots have been there for several years.     Relevant past medical, surgical, family and social history reviewed and updated as indicated. Interim medical history since our last visit reviewed. Allergies and medications reviewed and updated.  Review of Systems  Constitutional:  Negative for fatigue and unexpected weight change.  Cardiovascular:  Negative for palpitations and leg swelling.  Gastrointestinal:  Positive for diarrhea. Negative for constipation.  Endocrine: Negative for cold intolerance and heat intolerance.  Musculoskeletal:        Blue/ purple spots on butt  Psychiatric/Behavioral:  Positive for dysphoric mood. Negative for suicidal ideas. The patient is nervous/anxious.    Per HPI unless specifically indicated above     Objective:    BP 137/79   Pulse 94   Temp 98.3 F (36.8 C) (Oral)   Ht '5\' 10"'  (1.778 m)   Wt (!) 350 lb (158.8 kg)   SpO2 96%   BMI 50.22 kg/m   Wt Readings from Last 3 Encounters:  01/04/21 (!) 350 lb (158.8 kg)  06/09/20 (!) 350 lb (158.8 kg)  05/07/20 (!) 343 lb 8 oz (155.8 kg)    Physical Exam Vitals and nursing note reviewed.  Constitutional:      General: He is not in acute distress.    Appearance: Normal  appearance. He is obese. He is not ill-appearing, toxic-appearing or diaphoretic.  HENT:     Head: Normocephalic.     Right Ear: External ear normal.     Left Ear: External ear normal.     Nose: Nose normal. No congestion or rhinorrhea.     Mouth/Throat:     Mouth: Mucous membranes are moist.  Eyes:     General:        Right eye: No discharge.        Left eye: No discharge.     Extraocular Movements: Extraocular movements intact.     Conjunctiva/sclera: Conjunctivae normal.     Pupils: Pupils are equal, round, and reactive to light.  Cardiovascular:     Rate and Rhythm: Normal rate and regular rhythm.     Heart sounds: No murmur heard. Pulmonary:     Effort: Pulmonary effort is normal. No respiratory  distress.     Breath sounds: Normal breath sounds. No wheezing, rhonchi or rales.  Abdominal:     General: Abdomen is flat. Bowel sounds are normal.  Musculoskeletal:     Cervical back: Normal range of motion and neck supple.  Skin:    General: Skin is warm and dry.     Capillary Refill: Capillary refill takes less than 2 seconds.     Comments: Acne spots covering both buttocks.   Neurological:     General: No focal deficit present.     Mental Status: He is alert and oriented to person, place, and time.  Psychiatric:        Mood and Affect: Mood normal.        Behavior: Behavior normal.        Thought Content: Thought content normal.        Judgment: Judgment normal.    Results for orders placed or performed in visit on 06/09/20  TSH  Result Value Ref Range   TSH 0.189 (L) 0.450 - 4.500 uIU/mL      Assessment & Plan:   Problem List Items Addressed This Visit       Endocrine   Acquired hypothyroidism    Chronic. Not well controlled. Patient's last TSH in May 2022 was 0.189.  Did not return to have labs rechecked. Will draw labs today. Will adjust medication dose as needed based on lab results.  Follow up in 6 weeks for reevaluation.      Relevant Orders   TSH   T4, free     Other   Severe recurrent major depression without psychotic features ([place]) - Primary    Chronic. Not well controlled. Patient does not want to change medication.  Denies SI. Will continue with Wellbutrin 370m daily. Follow up in 6 weeks for reevaluation.       Relevant Orders   Comp Met (CMET)   GAD (generalized anxiety disorder)    Chronic. Not well controlled. Patient does not want to change medication.  Denies SI. Will continue with Wellbutrin 302mdaily. Follow up in 6 weeks for reevaluation.       Relevant Orders   Comp Met (CMET)   Other Visit Diagnoses     Acne vulgaris       Will treat with Benzol paroxide face wash. Discussed how to properly use medication. Follow up in 6 weeks  for reevaluation. [facility] not [facility]  Relevant Medications   benzoyl peroxide 5 % external liquid        Follow up plan: Return  in about 6 weeks (around 02/15/2021) for Thyroid and Acne.

## 2021-01-04 NOTE — Assessment & Plan Note (Signed)
Chronic. Not well controlled. Patient's last TSH in May 2022 was 0.189.  Did not return to have labs rechecked. Will draw labs today. Will adjust medication dose as needed based on lab results.  Follow up in 6 weeks for reevaluation.

## 2021-01-04 NOTE — Assessment & Plan Note (Signed)
Chronic. Not well controlled. Patient does not want to change medication.  Denies SI. Will continue with Wellbutrin 300mg  daily. Follow up in 6 weeks for reevaluation.

## 2021-01-04 NOTE — Telephone Encounter (Signed)
Requested medication (s) are due for refill today - no  Requested medication (s) are on the active medication list -yes  Future visit scheduled -no  Last refill: today  Notes to clinic: Pharmacy request clarification on medication   Requested Prescriptions  Pending Prescriptions Disp Refills   BENZOYL PEROXIDE 5 % external wash [Pharmacy Med Name: BENZOYL PER WASH 5% LIQ] 142 g 0    Sig: WASH   TOPICALLY TWICE DAILY     Dermatology:  Acne preparations Passed - 01/04/2021 11:32 AM      Passed - Valid encounter within last 12 months    Recent Outpatient Visits           Today Severe recurrent major depression without psychotic features (HCC)   [facility] Larae Grooms, NP   6 months ago Severe recurrent major depression without psychotic features (HCC)   [facility] Larae Grooms, NP   8 months ago Severe recurrent major depression without psychotic features (HCC)   [facility] Larae Grooms, NP   9 months ago Sinus drainage   [facility] [place], Corrie Dandy T, NP   1 year ago Allergic conjunctivitis of right eye   [facility] [place], Dorie Rank, NP                 Requested Prescriptions  Pending Prescriptions Disp Refills   BENZOYL PEROXIDE 5 % external wash [Pharmacy Med Name: BENZOYL PER WASH 5% LIQ] 142 g 0    Sig: WASH   TOPICALLY TWICE DAILY     Dermatology:  Acne preparations Passed - 01/04/2021 11:32 AM      Passed - Valid encounter within last 12 months    Recent Outpatient Visits           Today Severe recurrent major depression without psychotic features (HCC)   [facility] Larae Grooms, NP   6 months ago Severe recurrent major depression without psychotic features (HCC)   [facility] Larae Grooms, NP   8 months ago Severe recurrent major depression without psychotic features (HCC)   [facility] Larae Grooms, NP   9 months ago  Sinus drainage   [facility] [place], Corrie Dandy T, NP   1 year ago Allergic conjunctivitis of right eye   [facility] [place], Dorie Rank, NP

## 2021-01-07 ENCOUNTER — Other Ambulatory Visit: Payer: Self-pay | Admitting: Nurse Practitioner

## 2021-01-07 LAB — COMPREHENSIVE METABOLIC PANEL
ALT: 23 IU/L (ref 0–44)
AST: 14 IU/L (ref 0–40)
Albumin/Globulin Ratio: 1.9 (ref 1.2–2.2)
Albumin: 4.8 g/dL (ref 4.1–5.2)
Alkaline Phosphatase: 77 IU/L (ref 44–121)
BUN/Creatinine Ratio: 20 (ref 9–20)
BUN: 19 mg/dL (ref 6–20)
Bilirubin Total: 0.7 mg/dL (ref 0.0–1.2)
CO2: 21 mmol/L (ref 20–29)
Calcium: 10.1 mg/dL (ref 8.7–10.2)
Chloride: 103 mmol/L (ref 96–106)
Creatinine, Ser: 0.97 mg/dL (ref 0.76–1.27)
Globulin, Total: 2.5 g/dL (ref 1.5–4.5)
Glucose: 89 mg/dL (ref 70–99)
Potassium: 4.8 mmol/L (ref 3.5–5.2)
Sodium: 138 mmol/L (ref 134–144)
Total Protein: 7.3 g/dL (ref 6.0–8.5)
eGFR: 108 mL/min/{1.73_m2} (ref >59)

## 2021-01-07 LAB — T4, FREE: Free T4: 1.4 ng/dL (ref 0.82–1.77)

## 2021-01-07 LAB — TSH: TSH: 3.58 u[IU]/mL (ref 0.450–4.500)

## 2021-01-07 MED ORDER — BENZOYL PEROXIDE WASH 5 % EX LIQD: Freq: Two times a day (BID) | CUTANEOUS | 0 refills | Status: DC

## 2021-01-07 MED ORDER — LEVOTHYROXINE SODIUM 200 MCG PO TABS: 200.0000 ug | ORAL_TABLET | Freq: Every day | ORAL | 0 refills | Status: DC

## 2021-01-07 MED ORDER — BUPROPION HCL ER (XL) 300 MG PO TB24: ORAL_TABLET | ORAL | 0 refills | Status: DC

## 2021-01-07 NOTE — Telephone Encounter (Signed)
Requested Prescriptions  Pending Prescriptions Disp Refills  . benzoyl peroxide 5 % external liquid 142 g 0    Sig: Apply topically 2 (two) times daily.     Dermatology:  Acne preparations Passed - 01/07/2021  2:24 PM      Passed - Valid encounter within last 12 months    Recent Outpatient Visits          3 days ago Severe recurrent major depression without psychotic features (HCC)   [facility] Larae Grooms, NP   7 months ago Severe recurrent major depression without psychotic features (HCC)   [facility] Larae Grooms, NP   8 months ago Severe recurrent major depression without psychotic features (HCC)   [facility] Larae Grooms, NP   9 months ago Sinus drainage   [facility] [place], [place] T, NP   1 year ago Allergic conjunctivitis of right eye   [facility] [place], Jolene T, NP             . buPROPion (WELLBUTRIN XL) 300 MG 24 hr tablet 90 tablet 0    Sig: TAKE 1 TABLET BY MOUTH ONCE DAILY (PLEASE  SCHEDULE  FOLLOW-UP  APPOINTMENT)     Psychiatry: Antidepressants - bupropion Passed - 01/07/2021  2:24 PM      Passed - Completed PHQ-2 or PHQ-9 in the last 360 days      Passed - Last BP in normal range    BP Readings from Last 1 Encounters:  01/04/21 137/79         Passed - Valid encounter within last 6 months    Recent Outpatient Visits          3 days ago Severe recurrent major depression without psychotic features (HCC)   [facility] Larae Grooms, NP   7 months ago Severe recurrent major depression without psychotic features (HCC)   [facility] Larae Grooms, NP   8 months ago Severe recurrent major depression without psychotic features (HCC)   [facility] Larae Grooms, NP   9 months ago Sinus drainage   [facility] [place], [place] T, NP   1 year ago Allergic conjunctivitis of right eye   [facility]  [place], [place] T, NP             . levothyroxine (SYNTHROID) 200 MCG tablet 90 tablet 0    Sig: Take 1 tablet (200 mcg total) by mouth daily.     Endocrinology:  Hypothyroid Agents Failed - 01/07/2021  2:24 PM      Failed - TSH needs to be rechecked within 3 months after an abnormal result. Refill until TSH is due.      Passed - TSH in normal range and within 360 days    TSH  Date Value Ref Range Status  01/04/2021 3.580 0.450 - 4.500 uIU/mL Final         Passed - Valid encounter within last 12 months    Recent Outpatient Visits          3 days ago Severe recurrent major depression without psychotic features (HCC)   [facility] Larae Grooms, NP   7 months ago Severe recurrent major depression without psychotic features (HCC)   [facility] Larae Grooms, NP   8 months ago Severe recurrent major depression without psychotic features [facility])   [facility] Larae Grooms, NP   9 months ago Sinus drainage   [facility] [place],  Dorie Rank, NP   1 year ago Allergic conjunctivitis of right eye   [facility] [place], Dorie Rank, NP

## 2021-01-07 NOTE — Progress Notes (Signed)
Please let patient know that his thyroid labs are within normal range.  We will continue with his current dose.  Please let me know if he has any questions.

## 2021-01-07 NOTE — Telephone Encounter (Signed)
Copied from CRM [id number]. Topic: Quick Communication - Rx Refill/Question >> Jan 07, 2021  1:29 PM Izora Ribas, [place] A wrote: Medication: benzoyl peroxide 5 % external liquid [[id number]]   buPROPion (WELLBUTRIN XL) 300 MG 24 hr tablet [[id number]]  levothyroxine (SYNTHROID) 200 MCG tablet [[id number]]   Has the patient contacted their pharmacy? Yes.  The patient has been directed to contact their PCP (Agent: If no, request that the patient contact the pharmacy for the refill. If patient does not wish to contact the pharmacy document the reason why and proceed with request.) (Agent: If yes, when and what did the pharmacy advise?)  Preferred Pharmacy (with phone number or street name): [facility] [address] [place], [place] - [address]  Phone:  [phone] Fax:  [phone]    Has the patient been seen for an appointment in the last year OR does the patient have an upcoming appointment? Yes.    Agent: Please be advised that RX refills may take up to 3 business days. We ask that you follow-up with your pharmacy.

## 2021-02-04 ENCOUNTER — Ambulatory Visit: Payer: Self-pay

## 2021-02-04 NOTE — Telephone Encounter (Signed)
°  Chief Complaint: URI Symptoms: congestion, cough, sore throat Frequency: 1 weel Pertinent Negatives: Patient denies fever Disposition: [] ED /[] Urgent Care (no appt availability in office) / [x] Appointment(In office/virtual)/ []  [facility]/ [] Home Care/ [] Refused Recommended Disposition /[] [facility]/ []  Follow-up with PCP Additional Notes: Pt scheduled for OV 02/07/21 at 0900. Advised pt to call back if symptoms worsen before Monday and can help set up virtual UC visit. Care advice given and pt verbalized understanding. No other questions/concerns noted.     Patient has cough, sore throat and congestion. Unable to get appointment until 02/10/21. Would like a call back please  Reason for Disposition  Lots of coughing  Answer Assessment - Initial Assessment Questions 1. LOCATION: "Where does it hurt?"      pressure 2. ONSET: "When did the sinus pain start?"  (e.g., hours, days)      01/29/21 5. NASAL CONGESTION: "Is the nose blocked?" If Yes, ask: "Can you open it or must you breathe through your mouth?"     yes 6. NASAL DISCHARGE: "Do you have discharge from your nose?" If so ask, "What color?"     greenish 7. FEVER: "Do you have a fever?" If Yes, ask: "What is it, how was it measured, and when did it start?"      NO 8. OTHER SYMPTOMS: "Do you have any other symptoms?" (e.g., sore throat, cough, earache, difficulty breathing)     Sore throat, cough  Protocols used: Sinus Pain or Congestion-A-AH

## 2021-02-07 ENCOUNTER — Ambulatory Visit: Payer: Self-pay | Admitting: Nurse Practitioner

## 2021-04-03 ENCOUNTER — Other Ambulatory Visit: Payer: Self-pay | Admitting: Nurse Practitioner

## 2021-04-05 NOTE — Telephone Encounter (Signed)
Requested medication (s) are due for refill today:   Yes for both ? ?Requested medication (s) are on the active medication list:   Yes for both ? ?Future visit scheduled:   No   Just seen 3 mo. ago ? ? ?Last ordered: Both 01/07/2021 #90, 0 refills ? ?Returned because not sure if the message for a f/u appt is new or if it's from where he had not been seen for 10 mo.   Just seen 3 mo. Ago.     ? ?Requested Prescriptions  ?Pending Prescriptions Disp Refills  ? buPROPion (WELLBUTRIN XL) 300 MG 24 hr tablet [Pharmacy Med Name: buPROPion HCl ER (XL) 300 MG Oral Tablet Extended Release 24 Hour] 90 tablet 0  ?  Sig: TAKE 1 TABLET BY MOUTH ONCE DAILY PLEASE  SCHEDULE  A  FOLLOW  UP  APPOINTMENT  ?  ? Psychiatry: Antidepressants - bupropion Passed - 04/03/2021 12:18 PM  ?  ?  Passed - Cr in normal range and within 360 days  ?  Creatinine  ?Date Value Ref Range Status  ?01/09/2014 1.34 (H) 0.60 - 1.30 mg/dL Final  ? ?Creatinine, Ser  ?Date Value Ref Range Status  ?01/04/2021 0.97 0.76 - 1.27 mg/dL Final  ?  ?  ?  ?  Passed - AST in normal range and within 360 days  ?  AST  ?Date Value Ref Range Status  ?01/04/2021 14 0 - 40 IU/L Final  ? ?SGOT(AST)  ?Date Value Ref Range Status  ?01/09/2014 45 (H) 15 - 37 Unit/L Final  ?  ?  ?  ?  Passed - ALT in normal range and within 360 days  ?  ALT  ?Date Value Ref Range Status  ?01/04/2021 23 0 - 44 IU/L Final  ? ?SGPT (ALT)  ?Date Value Ref Range Status  ?01/09/2014 58 U/L Final  ?  Comment:  ?  14-63 ?NOTE: New Reference Range ?08/19/13 ?  ?  ?  ?  ?  Passed - Completed PHQ-2 or PHQ-9 in the last 360 days  ?  ?  Passed - Last BP in normal range  ?  BP Readings from Last 1 Encounters:  ?01/04/21 137/79  ?  ?  ?  ?  Passed - Valid encounter within last 6 months  ?  Recent Outpatient Visits   ? ?      ? 3 months ago Severe recurrent major depression without psychotic features ([place])  ? [place], NP  ? 10 months ago Severe recurrent major depression without  psychotic features ([place])  ? [place], NP  ? 11 months ago Severe recurrent major depression without psychotic features ([place])  ? [place], NP  ? 1 year ago Sinus drainage  ? [place], Henrine Screws T, NP  ? 1 year ago Allergic conjunctivitis of right eye  ? [place], [place] T, NP  ? ?  ?  ? ?  ?  ?  ? levothyroxine (SYNTHROID) 200 MCG tablet [Pharmacy Med Name: Levothyroxine Sodium 200 MCG Oral Tablet] 90 tablet 0  ?  Sig: Take 1 tablet by mouth once daily  ?  ? Endocrinology:  Hypothyroid Agents Passed - 04/03/2021 12:18 PM  ?  ?  Passed - TSH in normal range and within 360 days  ?  TSH  ?Date Value Ref Range Status  ?01/04/2021 3.580 0.450 - 4.500 uIU/mL Final  ?  ?  ?  ?  Passed - Valid encounter within last 12 months  ?  Recent Outpatient Visits   ? ?      ? 3 months ago Severe recurrent major depression without psychotic features ([place])  ? [place], NP  ? 10 months ago Severe recurrent major depression without psychotic features ([place])  ? [place], NP  ? 11 months ago Severe recurrent major depression without psychotic features ([place])  ? [place], NP  ? 1 year ago Sinus drainage  ? [place], Henrine Screws T, NP  ? 1 year ago Allergic conjunctivitis of right eye  ? [facility] [place], Henrine Screws T, NP  ? ?  ?  ? ?  ?  ?  ? ?

## 2021-04-06 ENCOUNTER — Other Ambulatory Visit: Payer: Self-pay | Admitting: Nurse Practitioner

## 2021-04-06 NOTE — Telephone Encounter (Signed)
Copied from CRM [id number]. Topic: Quick Communication - Rx Refill/Question ?>> Apr 06, 2021 12:40 PM Jaquita Rector A wrote: ?Medication: levothyroxine (SYNTHROID) 200 MCG tablet, buPROPion (WELLBUTRIN XL) 300 MG 24 hr tablet  ? ?Has the patient contacted their pharmacy? Yes.  Told to call dr  ?(Agent: If no, request that the patient contact the pharmacy for the refill. If patient does not wish to contact the pharmacy document the reason why and proceed with request.) ?(Agent: If yes, when and what did the pharmacy advise?) ? ?Preferred Pharmacy (with phone number or street name): [facility] [address] [place] - [address]  ?Phone:  [phone] ?Fax:  [phone] ? ? ? ?Has the patient been seen for an appointment in the last year OR does the patient have an upcoming appointment? Yes.   ? ?Agent: Please be advised that RX refills may take up to 3 business days. We ask that you follow-up with your pharmacy. ?

## 2021-04-11 NOTE — Progress Notes (Unsigned)
There were no vitals taken for this visit.   Subjective:    Patient ID: Joe Haley, male    DOB: 12/26/1990, 31 y.o.   MRN: [id number]  HPI: Joe Haley is a 31 y.o. male  No chief complaint on file.  DEPRESSION/ANXIETY Patient states he doesn't think he has been taking the Buspar.  Patient states he feels like his depression and anxiety are a little bit better recently.  He would like to stay with the Wellbutrin 346m daily.   [place] PHQ-9 Total Score 10      GAD 7 : Generalized Anxiety Score 01/04/2021 06/09/2020 05/07/2020 08/12/2019  Nervous, Anxious, on Edge _0 Control/stop worrying _1 Worry too much - different things _2 Trouble relaxing _3 Restless _4 Easily annoyed or irritable 1 2 0 0  Afraid - awful might happen _5 Total GAD 7 Score _6 Anxiety Difficulty - Somewhat difficult Extremely difficult Somewhat difficult      HYPOTHYROIDISM Patient states he hasn't been out of his medication.  He is not sure of the dose.   Thyroid control status:better Satisfied with current treatment? yes Medication side effects: no Medication compliance: excellent compliance Etiology of hypothyroidism:  Recent dose adjustment:yes Fatigue: no Cold intolerance: no Heat intolerance: no Weight gain: no Weight loss: no Constipation: no Diarrhea/loose stools: yes- some diarrhea Palpitations: no Lower extremity edema: no Anxiety/depressed mood: yes  Patient states he has been having purple/bluish spots on his back side that are always there.  States they have pain at times but it comes and goes. The pain is is a 3/10 stabbing pain.  The spots have been there for several years.     Relevant past medical, surgical, family and social history reviewed and updated as indicated. Interim medical history since our last visit reviewed. Allergies and medications reviewed  and updated.  Review of Systems  Constitutional:  Negative for fatigue and unexpected weight change.  Cardiovascular:  Negative for palpitations and leg swelling.  Gastrointestinal:  Positive for diarrhea. Negative for constipation.  Endocrine: Negative for cold intolerance and heat intolerance.  Musculoskeletal:        Blue/ purple spots on butt  Psychiatric/Behavioral:  Positive for dysphoric mood. Negative for suicidal ideas. The patient is nervous/anxious.    Per HPI unless specifically indicated above     Objective:    There were no vitals taken for this visit.  Wt Readings from Last 3 Encounters:  01/04/21 (!) 350 lb (158.8 kg)  06/09/20 (!) 350 lb (158.8 kg)  05/07/20 (!) 343 lb 8 oz (155.8 kg)    Physical Exam Vitals and nursing note reviewed.  Constitutional:      General: He is not in acute distress.    Appearance: Normal appearance. He is obese. He is not ill-appearing, toxic-appearing or diaphoretic.  HENT:     Head: Normocephalic.     Right Ear: External ear normal.     Left Ear: External ear normal.     Nose: Nose normal. No congestion or rhinorrhea.     Mouth/Throat:     Mouth: Mucous membranes are moist.  Eyes:     General:        Right eye: No discharge.        Left eye: No discharge.  Extraocular Movements: Extraocular movements intact.     Conjunctiva/sclera: Conjunctivae normal.     Pupils: Pupils are equal, round, and reactive to light.  Cardiovascular:     Rate and Rhythm: Normal rate and regular rhythm.     Heart sounds: No murmur heard. Pulmonary:     Effort: Pulmonary effort is normal. No respiratory distress.     Breath sounds: Normal breath sounds. No wheezing, rhonchi or rales.  Abdominal:     General: Abdomen is flat. Bowel sounds are normal.  Musculoskeletal:     Cervical back: Normal range of motion and neck supple.  Skin:    General: Skin is warm and dry.     Capillary Refill: Capillary refill takes less than 2 seconds.      Comments: Acne spots covering both buttocks.   Neurological:     General: No focal deficit present.     Mental Status: He is alert and oriented to person, place, and time.  Psychiatric:        Mood and Affect: Mood normal.        Behavior: Behavior normal.        Thought Content: Thought content normal.        Judgment: Judgment normal.    Results for orders placed or performed in visit on 01/04/21  Comp Met (CMET)  Result Value Ref Range   Glucose 89 70 - 99 mg/dL   BUN 19 6 - 20 mg/dL   Creatinine, Ser 0.97 0.76 - 1.27 mg/dL   eGFR 108 >59 mL/min/1.73   BUN/Creatinine Ratio 20 9 - 20   Sodium 138 134 - 144 mmol/L   Potassium 4.8 3.5 - 5.2 mmol/L   Chloride 103 96 - 106 mmol/L   CO2 21 20 - 29 mmol/L   Calcium 10.1 8.7 - 10.2 mg/dL   Total Protein 7.3 6.0 - 8.5 g/dL   Albumin 4.8 4.1 - 5.2 g/dL   Globulin, Total 2.5 1.5 - 4.5 g/dL   Albumin/Globulin Ratio 1.9 1.2 - 2.2   Bilirubin Total 0.7 0.0 - 1.2 mg/dL   Alkaline Phosphatase 77 44 - 121 IU/L   AST 14 0 - 40 IU/L   ALT 23 0 - 44 IU/L  TSH  Result Value Ref Range   TSH 3.580 0.450 - 4.500 uIU/mL  T4, free  Result Value Ref Range   Free T4 1.40 0.82 - 1.77 ng/dL      Assessment & Plan:   Problem List Items Addressed This Visit       Endocrine   Acquired hypothyroidism - Primary     Other   Severe recurrent major depression without psychotic features (HCC)   GAD (generalized anxiety disorder)   Hyperlipidemia     Follow up plan: No follow-ups on file.

## 2021-04-12 ENCOUNTER — Other Ambulatory Visit: Payer: Self-pay

## 2021-04-12 ENCOUNTER — Encounter: Payer: Self-pay | Admitting: Nurse Practitioner

## 2021-04-12 ENCOUNTER — Ambulatory Visit (INDEPENDENT_AMBULATORY_CARE_PROVIDER_SITE_OTHER): Payer: Self-pay | Admitting: Nurse Practitioner

## 2021-04-12 VITALS — BP 111/73 | HR 91 | Temp 98.3°F | Wt 361.2 lb

## 2021-04-12 DIAGNOSIS — E782 Mixed hyperlipidemia: Secondary | ICD-10-CM

## 2021-04-12 DIAGNOSIS — E039 Hypothyroidism, unspecified: Secondary | ICD-10-CM

## 2021-04-12 DIAGNOSIS — F411 Generalized anxiety disorder: Secondary | ICD-10-CM

## 2021-04-12 DIAGNOSIS — F332 Major depressive disorder, recurrent severe without psychotic features: Secondary | ICD-10-CM

## 2021-04-12 MED ORDER — BUPROPION HCL ER (XL) 300 MG PO TB24: ORAL_TABLET | ORAL | 0 refills | Status: DC

## 2021-04-12 MED ORDER — LEVOTHYROXINE SODIUM 200 MCG PO TABS: 200.0000 ug | ORAL_TABLET | Freq: Every day | ORAL | 1 refills | Status: DC

## 2021-04-12 MED ORDER — BUPROPION HCL ER (XL) 300 MG PO TB24
ORAL_TABLET | ORAL | 1 refills | Status: DC
Start: 2021-04-12 — End: 2021-05-17

## 2021-04-12 MED ORDER — BENZOYL PEROXIDE WASH 5 % EX LIQD: Freq: Two times a day (BID) | CUTANEOUS | 0 refills | Status: DC

## 2021-04-12 NOTE — Assessment & Plan Note (Signed)
Chronic.  Ongoing.  Continue with current medication regimen.  Patient does not want to change medications.  Labs ordered today.  Return to clinic in 6 months for reevaluation.  Call sooner if concerns arise.  ? ?

## 2021-04-12 NOTE — Assessment & Plan Note (Signed)
Labs ordered today.  Will make recommendations based on lab results. ?

## 2021-04-12 NOTE — Assessment & Plan Note (Signed)
Chronic.  Controlled.  Continue with current medication regimen on levothyroxine .  Refill sent today.  Labs ordered today.  Return to clinic in 6 months for reevaluation.  Call sooner if concerns arise.  ? ?

## 2021-04-13 LAB — LIPID PANEL
Chol/HDL Ratio: 3 ratio (ref 0.0–5.0)
Cholesterol, Total: 137 mg/dL (ref 100–199)
HDL: 46 mg/dL (ref >39)
LDL Chol Calc (NIH): 66 mg/dL (ref 0–99)
Triglycerides: 142 mg/dL (ref 0–149)
VLDL Cholesterol Cal: 25 mg/dL (ref 5–40)

## 2021-04-13 LAB — COMPREHENSIVE METABOLIC PANEL
ALT: 32 IU/L (ref 0–44)
AST: 19 IU/L (ref 0–40)
Albumin/Globulin Ratio: 2 (ref 1.2–2.2)
Albumin: 4.6 g/dL (ref 4.1–5.2)
Alkaline Phosphatase: 79 IU/L (ref 44–121)
BUN/Creatinine Ratio: 12 (ref 9–20)
BUN: 10 mg/dL (ref 6–20)
Bilirubin Total: 0.7 mg/dL (ref 0.0–1.2)
CO2: 23 mmol/L (ref 20–29)
Calcium: 9.7 mg/dL (ref 8.7–10.2)
Chloride: 102 mmol/L (ref 96–106)
Creatinine, Ser: 0.84 mg/dL (ref 0.76–1.27)
Globulin, Total: 2.3 g/dL (ref 1.5–4.5)
Glucose: 85 mg/dL (ref 70–99)
Potassium: 4.6 mmol/L (ref 3.5–5.2)
Sodium: 137 mmol/L (ref 134–144)
Total Protein: 6.9 g/dL (ref 6.0–8.5)
eGFR: 120 mL/min/{1.73_m2} (ref >59)

## 2021-04-13 LAB — TSH: TSH: 2.29 u[IU]/mL (ref 0.450–4.500)

## 2021-04-13 LAB — T4, FREE: Free T4: 1.28 ng/dL (ref 0.82–1.77)

## 2021-04-13 NOTE — Progress Notes (Signed)
Hi Joe Haley.  It was good to see you yesterday.  Your lab work looks great.  Your thyroid labs are within normal range.  Continue with your current regimen. Your cholesterol has improved from prior.  Keep up the good work!  See you at our next visit.

## 2021-05-10 ENCOUNTER — Other Ambulatory Visit: Payer: Self-pay | Admitting: Nurse Practitioner

## 2021-05-10 NOTE — Telephone Encounter (Signed)
Medication Refill - Medication:  ? ?buPROPion (WELLBUTRIN XL) 300 MG 24 hr tablet  ? ?Has the patient contacted their pharmacy? Yes.   ?Contact PCP ? ?Preferred Pharmacy (with phone number or street name):  ?[facility] [address] [place], [place] - [address]  ?[address] [place] [address]  ?Phone:  [phone]  Fax:  [phone]  ? ?Has the patient been seen for an appointment in the last year OR does the patient have an upcoming appointment? Yes.   ? ?Agent: Please be advised that RX refills may take up to 3 business days. We ask that you follow-up with your pharmacy. ?

## 2021-05-11 NOTE — Telephone Encounter (Signed)
Last RF:04/12/21   #90 1 RF--- too soon should have 1 RF left ? ?Requested Prescriptions  ?Refused Prescriptions Disp Refills  ?? buPROPion (WELLBUTRIN XL) 300 MG 24 hr tablet 90 tablet 1  ?  Sig: TAKE 1 TABLET BY MOUTH ONCE DAILY PLEASE  SCHEDULE  A  FOLLOW  UP  APPOINTMENT  ?  ? Psychiatry: Antidepressants - bupropion Passed - 05/10/2021 12:25 PM  ?  ?  Passed - Cr in normal range and within 360 days  ?  Creatinine  ?Date Value Ref Range Status  ?01/09/2014 1.34 (H) 0.60 - 1.30 mg/dL Final  ? ?Creatinine, Ser  ?Date Value Ref Range Status  ?04/12/2021 0.84 0.76 - 1.27 mg/dL Final  ?   ?  ?  Passed - AST in normal range and within 360 days  ?  AST  ?Date Value Ref Range Status  ?04/12/2021 19 0 - 40 IU/L Final  ? ?SGOT(AST)  ?Date Value Ref Range Status  ?01/09/2014 45 (H) 15 - 37 Unit/L Final  ?   ?  ?  Passed - ALT in normal range and within 360 days  ?  ALT  ?Date Value Ref Range Status  ?04/12/2021 32 0 - 44 IU/L Final  ? ?SGPT (ALT)  ?Date Value Ref Range Status  ?01/09/2014 58 U/L Final  ?  Comment:  ?  14-63 ?NOTE: New Reference Range ?08/19/13 ?  ?   ?  ?  Passed - Completed PHQ-2 or PHQ-9 in the last 360 days  ?  ?  Passed - Last BP in normal range  ?  BP Readings from Last 1 Encounters:  ?04/12/21 111/73  ?   ?  ?  Passed - Valid encounter within last 6 months  ?  Recent Outpatient Visits   ?      ? 4 weeks ago Acquired hypothyroidism  ? [facility] Larae Grooms, NP  ? 4 months ago Severe recurrent major depression without psychotic features (HCC)  ? [facility] Larae Grooms, NP  ? 11 months ago Severe recurrent major depression without psychotic features (HCC)  ? [facility] Larae Grooms, NP  ? 1 year ago Severe recurrent major depression without psychotic features (HCC)  ? [facility] Larae Grooms, NP  ? 1 year ago Sinus drainage  ? [facility] [place], Corrie Dandy T, NP  ?  ?  ? ?  ?  ?  ? ? ?

## 2021-05-17 MED ORDER — BUPROPION HCL ER (XL) 300 MG PO TB24: ORAL_TABLET | ORAL | 0 refills | Status: DC

## 2021-05-17 NOTE — Telephone Encounter (Signed)
Per pharmacy pt picked up #30 supply. Refill sent, pt had f/u appt ?Requested Prescriptions  ?Pending Prescriptions Disp Refills  ?? buPROPion (WELLBUTRIN XL) 300 MG 24 hr tablet 90 tablet 0  ?  Sig: TAKE 1 TABLET BY MOUTH ONCE DAILY PLEASE  SCHEDULE  A  FOLLOW  UP  APPOINTMENT  ?  ? Psychiatry: Antidepressants - bupropion Passed - 05/17/2021 11:33 AM  ?  ?  Passed - Cr in normal range and within 360 days  ?  Creatinine  ?Date Value Ref Range Status  ?01/09/2014 1.34 (H) 0.60 - 1.30 mg/dL Final  ? ?Creatinine, Ser  ?Date Value Ref Range Status  ?04/12/2021 0.84 0.76 - 1.27 mg/dL Final  ?   ?  ?  Passed - AST in normal range and within 360 days  ?  AST  ?Date Value Ref Range Status  ?04/12/2021 19 0 - 40 IU/L Final  ? ?SGOT(AST)  ?Date Value Ref Range Status  ?01/09/2014 45 (H) 15 - 37 Unit/L Final  ?   ?  ?  Passed - ALT in normal range and within 360 days  ?  ALT  ?Date Value Ref Range Status  ?04/12/2021 32 0 - 44 IU/L Final  ? ?SGPT (ALT)  ?Date Value Ref Range Status  ?01/09/2014 58 U/L Final  ?  Comment:  ?  14-63 ?NOTE: New Reference Range ?08/19/13 ?  ?   ?  ?  Passed - Completed PHQ-2 or PHQ-9 in the last 360 days  ?  ?  Passed - Last BP in normal range  ?  BP Readings from Last 1 Encounters:  ?04/12/21 111/73  ?   ?  ?  Passed - Valid encounter within last 6 months  ?  Recent Outpatient Visits   ?      ? 1 month ago Acquired hypothyroidism  ? [place], NP  ? 4 months ago Severe recurrent major depression without psychotic features ([place])  ? [place], NP  ? 11 months ago Severe recurrent major depression without psychotic features ([place])  ? [place], NP  ? 1 year ago Severe recurrent major depression without psychotic features ([place])  ? [place], NP  ? 1 year ago Sinus drainage  ? [facility] [place], Henrine Screws T, NP  ?  ?  ? ?  ?  ?  ?Refused Prescriptions Disp  Refills  ?? buPROPion (WELLBUTRIN XL) 300 MG 24 hr tablet 90 tablet 1  ?  Sig: TAKE 1 TABLET BY MOUTH ONCE DAILY PLEASE  SCHEDULE  A  FOLLOW  UP  APPOINTMENT  ?  ? Psychiatry: Antidepressants - bupropion Passed - 05/17/2021 11:33 AM  ?  ?  Passed - Cr in normal range and within 360 days  ?  Creatinine  ?Date Value Ref Range Status  ?01/09/2014 1.34 (H) 0.60 - 1.30 mg/dL Final  ? ?Creatinine, Ser  ?Date Value Ref Range Status  ?04/12/2021 0.84 0.76 - 1.27 mg/dL Final  ?   ?  ?  Passed - AST in normal range and within 360 days  ?  AST  ?Date Value Ref Range Status  ?04/12/2021 19 0 - 40 IU/L Final  ? ?SGOT(AST)  ?Date Value Ref Range Status  ?01/09/2014 45 (H) 15 - 37 Unit/L Final  ?   ?  ?  Passed - ALT in normal range and within 360 days  ?  ALT  ?Date  Value Ref Range Status  ?04/12/2021 32 0 - 44 IU/L Final  ? ?SGPT (ALT)  ?Date Value Ref Range Status  ?01/09/2014 58 U/L Final  ?  Comment:  ?  14-63 ?NOTE: New Reference Range ?08/19/13 ?  ?   ?  ?  Passed - Completed PHQ-2 or PHQ-9 in the last 360 days  ?  ?  Passed - Last BP in normal range  ?  BP Readings from Last 1 Encounters:  ?04/12/21 111/73  ?   ?  ?  Passed - Valid encounter within last 6 months  ?  Recent Outpatient Visits   ?      ? 1 month ago Acquired hypothyroidism  ? [place], NP  ? 4 months ago Severe recurrent major depression without psychotic features ([place])  ? [place], NP  ? 11 months ago Severe recurrent major depression without psychotic features ([place])  ? [place], NP  ? 1 year ago Severe recurrent major depression without psychotic features ([place])  ? [place], NP  ? 1 year ago Sinus drainage  ? [facility] [place], Henrine Screws T, NP  ?  ?  ? ?  ?  ?  ? ?

## 2021-05-17 NOTE — Telephone Encounter (Signed)
Refill sent. Pt had picked up #30 last refill. Has had F/U appt  ?

## 2021-05-17 NOTE — Telephone Encounter (Signed)
Pt called in stating the medication didn't make it to the pharmacy, he spoke with them and they stated they never received it, please advise.  ?

## 2021-06-16 ENCOUNTER — Encounter: Payer: Self-pay | Admitting: Physician Assistant

## 2021-06-16 ENCOUNTER — Ambulatory Visit: Payer: Self-pay | Admitting: Physician Assistant

## 2021-06-16 ENCOUNTER — Ambulatory Visit: Payer: Self-pay

## 2021-06-16 VITALS — BP 121/82 | HR 85 | Temp 97.9°F | Wt 356.0 lb

## 2021-06-16 DIAGNOSIS — R051 Acute cough: Secondary | ICD-10-CM

## 2021-06-16 DIAGNOSIS — H66001 Acute suppurative otitis media without spontaneous rupture of ear drum, right ear: Secondary | ICD-10-CM

## 2021-06-16 DIAGNOSIS — J0100 Acute maxillary sinusitis, unspecified: Secondary | ICD-10-CM

## 2021-06-16 MED ORDER — AMOXICILLIN-POT CLAVULANATE 875-125 MG PO TABS: 1.0000 | ORAL_TABLET | Freq: Two times a day (BID) | ORAL | 0 refills | Status: AC

## 2021-06-16 NOTE — Patient Instructions (Signed)
Based on your symptoms and duration of illness, I believe you may have a bacterial sinus infection  These typically resolve with antibiotic therapy along with at-home comfort measures  Today I have sent in a prescription for Augmentin 875- 125 mg to be taken by mouth twice per day for 7 days FINISH THE ENTIRE COURSE unless you develop an allergic reaction or are instructed to discontinue.  It can take a few days for the antibiotic to kick in so I recommend symptomatic relief with over the counter medication such as the following: Dayquil/ Nyquil Theraflu Alkaseltzer  Coricidin - if you have high blood pressure even if it is well managed with medications  These medications typically have Tylenol in them already so you can take Ibuprofen as needed for further pain/ discomfort and fever management/ do not need to supplement with more outside of those medications  Stay well hydrated with at least 75 oz of water per day to help with recovery  If you notice any of the following please let us know: increased fever not responding to Tylenol or Ibuprofen, swelling around your nose or eyes, difficulty seeing,   

## 2021-06-16 NOTE — Telephone Encounter (Signed)
Chief Complaint: sinus congestion Symptoms: green nasal drainage, hoarse, coughing up yellow phlegm, burning to the back of the throat with inhalation, facial pain (moderate), blocked nose Frequency: since Tuesday Pertinent Negatives: Patient denies fever, SOB, chest pain Disposition: [] ED /[] Urgent Care (no appt availability in office) / [x] Appointment(In office/virtual)/ []  [facility]/ [] Home Care/ [] Refused Recommended Disposition /[] [place] [facility]/ []  Follow-up with PCP Additional Notes: Agent made appt for pt prior to transfer - advised pt he has an appt With Erin Mecum PA at 1:00. Pt verbalized understanding.         Reason for Disposition  Cough with cold symptoms (e.g., runny nose, postnasal drip, throat clearing)  Answer Assessment - Initial Assessment Questions 1. ONSET: "When did the cough begin?"      Wednesday 2. SEVERITY: "How bad is the cough today?"      Ocassional cough 3. SPUTUM: "Describe the color of your sputum" (none, dry cough; clear, white, yellow, green)     yellow 4. HEMOPTYSIS: "Are you coughing up any blood?" If so ask: "How much?" (flecks, streaks, tablespoons, etc.)     no 5. DIFFICULTY BREATHING: "Are you having difficulty breathing?" If Yes, ask: "How bad is it?" (e.g., mild, moderate, severe)    - MILD: No SOB at rest, mild SOB with walking, speaks normally in sentences, can lie down, no retractions, pulse < 100.    - MODERATE: SOB at rest, SOB with minimal exertion and prefers to sit, cannot lie down flat, speaks in phrases, mild retractions, audible wheezing, pulse 100-120.    - SEVERE: Very SOB at rest, speaks in single words, struggling to breathe, sitting hunched forward, retractions, pulse > 120      none 6. FEVER: "Do you have a fever?" If Yes, ask: "What is your temperature, how was it measured, and when did it start?"     no 7. CARDIAC HISTORY: "Do you have any history of heart disease?" (e.g., heart attack, congestive  heart failure)      N/a 8. LUNG HISTORY: "Do you have any history of lung disease?"  (e.g., pulmonary embolus, asthma, emphysema)     N/a 9. PE RISK FACTORS: "Do you have a history of blood clots?" (or: recent major surgery, recent prolonged travel, bedridden)     N/a 10. OTHER SYMPTOMS: "Do you have any other symptoms?" (e.g., runny nose, wheezing, chest pain)       Runny nose 11. PREGNANCY: "Is there any chance you are pregnant?" "When was your last menstrual period?"       N/a 12. TRAVEL: "Have you traveled out of the country in the last month?" (e.g., travel history, exposures)       *No Answer*  Answer Assessment - Initial Assessment Questions 1. LOCATION: "Where does it hurt?"      Pain around nose and cheeks and eyes 2. ONSET: "When did the sinus pain start?"  (e.g., hours, days)      Tuesday 3. SEVERITY: "How bad is the pain?"   (Scale 1-10; mild, moderate or severe)   - MILD (1-3): doesn't interfere with normal activities    - MODERATE (4-7): interferes with normal activities (e.g., work or school) or awakens from sleep   - SEVERE (8-10): excruciating pain and patient unable to do any normal activities        moderate 4. RECURRENT SYMPTOM: "Have you ever had sinus problems before?" If Yes, ask: "When was the last time?" and "What happened that time?"  Yes- earlier this-medication  5. NASAL CONGESTION: "Is the nose blocked?" If Yes, ask: "Can you open it or must you breathe through your mouth?"     Nose is blocked 6. NASAL DISCHARGE: "Do you have discharge from your nose?" If so ask, "What color?"     Dark green 7. FEVER: "Do you have a fever?" If Yes, ask: "What is it, how was it measured, and when did it start?"      no 8. OTHER SYMPTOMS: "Do you have any other symptoms?" (e.g., sore throat, cough, earache, difficulty breathing)     Cough, burns in the back of throat when pt breathes-  9. PREGNANCY: "Is there any chance you are pregnant?" "When was your last menstrual  period?"     N/a  Protocols used: Sinus Pain or Congestion-A-AH, Cough - Acute Productive-A-AH

## 2021-06-16 NOTE — Progress Notes (Signed)
Acute Office Visit   Patient: Joe Haley   DOB: 03/09/1990   31 y.o. Male  MRN: [id number] Visit Date: 06/16/2021  Today's healthcare provider: Oswaldo Conroy Savio Albrecht, PA-C  Introduced myself to the patient as a Secondary school teacher and provided education on APPs in clinical practice.    Chief Complaint  Patient presents with   URI    Pt states he started having symptoms on Tuesday. States he has had a headache, cough, green/yellow phlegm, and sinus congestion. States he tried using nasal spray that did not help.    Subjective    HPI HPI     URI    Additional comments: Pt states he started having symptoms on Tuesday. States he has had a headache, cough, green/yellow phlegm, and sinus congestion. States he tried using nasal spray that did not help.       Last edited by Pablo Ledger, CMA on 06/16/2021  1:12 PM.       States symptoms started on Tuesday with congestion Mild, intermittent productive coughing Some sinus pain and pressure over maxillary sinuses  Interventions: OTC nasal spray Denies recent sick contacts Has not taken a home COVID test  States symptoms do not seem to be improving and feels a bit worse than yesterday    Medications: Outpatient Medications Prior to Visit  Medication Sig   benzoyl peroxide 5 % external liquid Apply topically 2 (two) times daily.   buPROPion (WELLBUTRIN XL) 300 MG 24 hr tablet TAKE 1 TABLET BY MOUTH ONCE DAILY PLEASE  SCHEDULE  A  FOLLOW  UP  APPOINTMENT   levothyroxine (SYNTHROID) 200 MCG tablet Take 1 tablet (200 mcg total) by mouth daily.   No facility-administered medications prior to visit.    Review of Systems  Constitutional:  Positive for fatigue. Negative for chills, diaphoresis and fever.  HENT:  Positive for congestion, postnasal drip, rhinorrhea, sinus pressure, sinus pain and voice change. Negative for ear pain and sore throat.   Respiratory:  Positive for cough. Negative for shortness of breath and wheezing.    Cardiovascular:  Negative for chest pain.  Gastrointestinal:  Negative for diarrhea, nausea and vomiting.  Musculoskeletal:  Negative for arthralgias and myalgias.  Neurological:  Positive for light-headedness and headaches. Negative for dizziness.      Objective    BP 121/82   Pulse 85   Temp 97.9 F (36.6 C) (Oral)   Wt (!) 356 lb (161.5 kg)   SpO2 97%   BMI 51.08 kg/m    Physical Exam Vitals reviewed.  Constitutional:      Appearance: Normal appearance. He is obese.  HENT:     Head: Normocephalic and atraumatic.     Right Ear: No drainage, swelling or tenderness. Tympanic membrane is erythematous and bulging.     Left Ear: No drainage, swelling or tenderness. Tympanic membrane is not erythematous, retracted or bulging.     Mouth/Throat:     Mouth: Mucous membranes are moist.     Pharynx: Uvula midline. No oropharyngeal exudate or posterior oropharyngeal erythema.  Cardiovascular:     Rate and Rhythm: Normal rate and regular rhythm.     Pulses: Normal pulses.     Heart sounds: Normal heart sounds. No murmur heard.   No friction rub. No gallop.  Pulmonary:     Effort: Pulmonary effort is normal. No respiratory distress.     Breath sounds: Normal breath sounds. No decreased breath sounds, wheezing, rhonchi or  rales.  Lymphadenopathy:     Head:     Right side of head: No submental or submandibular adenopathy.     Left side of head: No submental or submandibular adenopathy.     Cervical:     Right cervical: No superficial cervical adenopathy.    Left cervical: No superficial cervical adenopathy.     Upper Body:     Right upper body: No supraclavicular adenopathy.     Left upper body: No supraclavicular adenopathy.  Neurological:     Mental Status: He is alert.  Psychiatric:        Attention and Perception: Attention normal.        Mood and Affect: Mood normal. Affect is flat.        Speech: Speech normal.        Behavior: Behavior is withdrawn. Behavior is  cooperative.      No results found for any visits on 06/16/21.  Assessment & Plan      No follow-ups on file.     Problem List Items Addressed This Visit   None Visit Diagnoses     Acute maxillary sinusitis, recurrence not specified    -  Primary Acute, new problem since Tuesday Symptoms appear most consistent with sinusitis  Will send in Augmentin 875-125 mg PO BID x 7 days  Recommend he combine this with symptomatic management with OTC multi-symptom relief medications  Follow up as needed for progressing or worsening symptoms     Relevant Medications   amoxicillin-clavulanate (AUGMENTIN) 875-125 MG tablet   Acute suppurative otitis media of right ear without spontaneous rupture of tympanic membrane, recurrence not specified     Acute , new problem Bulging and erythematous TM consistent with acute otitis media  Likely to respond to Augmentin 875-125 mg PO BID x 7days that is provided for sinusitis  Follow up as needed for progressing or persistent symptoms     Relevant Medications   amoxicillin-clavulanate (AUGMENTIN) 875-125 MG tablet   Acute cough       Relevant Orders   Novel Coronavirus, NAA ([facility])        No follow-ups on file.   I, Devyon Keator E Nikkie Liming, PA-C, have reviewed all documentation for this visit. The documentation on 06/16/21 for the exam, diagnosis, procedures, and orders are all accurate and complete.   Jacquelin Hawking, MHS, PA-C [facility]

## 2021-06-17 LAB — NOVEL CORONAVIRUS, NAA: SARS-CoV-2, NAA: NOT DETECTED

## 2021-10-31 ENCOUNTER — Emergency Department
Admission: EM | Admit: 2021-10-31 | Discharge: 2021-11-01 | Disposition: A | Discharge status: Other Acute Inpatient Hospital | Payer: Managed Care, Other (non HMO) | Attending: Emergency Medicine | Admitting: Emergency Medicine

## 2021-10-31 ENCOUNTER — Emergency Department: Payer: Managed Care, Other (non HMO)

## 2021-10-31 DIAGNOSIS — F411 Generalized anxiety disorder: Secondary | ICD-10-CM | POA: Insufficient documentation

## 2021-10-31 DIAGNOSIS — F332 Major depressive disorder, recurrent severe without psychotic features: Secondary | ICD-10-CM | POA: Insufficient documentation

## 2021-10-31 DIAGNOSIS — T39392A Poisoning by other nonsteroidal anti-inflammatory drugs [NSAID], intentional self-harm, initial encounter: Secondary | ICD-10-CM | POA: Insufficient documentation

## 2021-10-31 DIAGNOSIS — Y900 Blood alcohol level of less than 20 mg/100 ml: Secondary | ICD-10-CM | POA: Insufficient documentation

## 2021-10-31 DIAGNOSIS — G2581 Restless legs syndrome: Secondary | ICD-10-CM | POA: Insufficient documentation

## 2021-10-31 DIAGNOSIS — R45851 Suicidal ideations: Secondary | ICD-10-CM | POA: Insufficient documentation

## 2021-10-31 DIAGNOSIS — T39311A Poisoning by propionic acid derivatives, accidental (unintentional), initial encounter: Secondary | ICD-10-CM | POA: Diagnosis present

## 2021-10-31 DIAGNOSIS — Z20822 Contact with and (suspected) exposure to covid-19: Secondary | ICD-10-CM | POA: Insufficient documentation

## 2021-10-31 LAB — COMPREHENSIVE METABOLIC PANEL
ALT: 21 U/L (ref 0–44)
AST: 21 U/L (ref 15–41)
Albumin: 4.3 g/dL (ref 3.5–5.0)
Alkaline Phosphatase: 65 U/L (ref 38–126)
Anion gap: 9 (ref 5–15)
BUN: 14 mg/dL (ref 6–20)
CO2: 24 mmol/L (ref 22–32)
Calcium: 9 mg/dL (ref 8.9–10.3)
Chloride: 106 mmol/L (ref 98–111)
Creatinine, Ser: 0.93 mg/dL (ref 0.61–1.24)
GFR, Estimated: >60 mL/min (ref >60)
Glucose, Bld: 106 mg/dL — ABNORMAL HIGH (ref 70–99)
Potassium: 4 mmol/L (ref 3.5–5.1)
Sodium: 139 mmol/L (ref 135–145)
Total Bilirubin: 0.9 mg/dL (ref 0.3–1.2)
Total Protein: 7.5 g/dL (ref 6.5–8.1)

## 2021-10-31 LAB — CBC
HCT: 44.1 % (ref 39.0–52.0)
Hemoglobin: 15 g/dL (ref 13.0–17.0)
MCH: 30.1 pg (ref 26.0–34.0)
MCHC: 34 g/dL (ref 30.0–36.0)
MCV: 88.6 fL (ref 80.0–100.0)
Platelets: 338 10*3/uL (ref 150–400)
RBC: 4.98 MIL/uL (ref 4.22–5.81)
RDW: 14 % (ref 11.5–15.5)
WBC: 12.9 10*3/uL — ABNORMAL HIGH (ref 4.0–10.5)
nRBC: 0 % (ref 0.0–0.2)

## 2021-10-31 LAB — TSH: TSH: 0.718 u[IU]/mL (ref 0.350–4.500)

## 2021-10-31 LAB — ETHANOL: Alcohol, Ethyl (B): <10 mg/dL (ref <10)

## 2021-10-31 LAB — URINE DRUG SCREEN, QUALITATIVE (ARMC ONLY)
Amphetamines, Ur Screen: NOT DETECTED
Barbiturates, Ur Screen: NOT DETECTED
Benzodiazepine, Ur Scrn: NOT DETECTED
Cannabinoid 50 Ng, Ur ~~LOC~~: NOT DETECTED
Cocaine Metabolite,Ur ~~LOC~~: NOT DETECTED
MDMA (Ecstasy)Ur Screen: NOT DETECTED
Methadone Scn, Ur: NOT DETECTED
Opiate, Ur Screen: NOT DETECTED
Phencyclidine (PCP) Ur S: NOT DETECTED
Tricyclic, Ur Screen: NOT DETECTED

## 2021-10-31 LAB — SARS CORONAVIRUS 2 BY RT PCR: SARS Coronavirus 2 by RT PCR: NEGATIVE

## 2021-10-31 LAB — SALICYLATE LEVEL: Salicylate Lvl: <7 mg/dL — ABNORMAL LOW (ref 7.0–30.0)

## 2021-10-31 LAB — ACETAMINOPHEN LEVEL: Acetaminophen (Tylenol), Serum: <10 ug/mL — ABNORMAL LOW (ref 10–30)

## 2021-10-31 MED ORDER — LEVOTHYROXINE SODIUM 75 MCG PO TABS
200.0000 ug | ORAL_TABLET | Freq: Every day | ORAL | Status: DC
  Administered 2021-11-01: 200 ug via ORAL
  Filled 2021-10-31: qty 2

## 2021-10-31 MED ORDER — BUPROPION HCL ER (XL) 150 MG PO TB24
300.0000 mg | ORAL_TABLET | Freq: Every day | ORAL | Status: DC
  Administered 2021-11-01: 300 mg via ORAL
  Filled 2021-10-31: qty 2

## 2021-10-31 MED ORDER — LEVOTHYROXINE SODIUM 50 MCG PO TABS: 200.0000 ug | ORAL_TABLET | Freq: Every day | ORAL | Status: DC

## 2021-10-31 NOTE — ED Notes (Signed)
Pt. To BHU from ED ambulatory without difficulty, to room  BHU 1. Report from [facility]. Pt. Is alert and oriented, warm and dry in no distress. Pt. Denies SI, HI, and AVH. Pt. Calm and cooperative. Pt. Made aware of security cameras and Q15 minute rounds. Pt. Encouraged to let Nursing staff know of any concerns or needs.    ENVIRONMENTAL ASSESSMENT Potentially harmful objects out of patient reach: Yes.   Personal belongings secured: Yes.   Patient dressed in hospital provided attire only: Yes.   Plastic bags out of patient reach: Yes.   Patient care equipment (cords, cables, call bells, lines, and drains) shortened, removed, or accounted for: Yes.   Equipment and supplies removed from bottom of stretcher: Yes.   Potentially toxic materials out of patient reach: Yes.   Sharps container removed or out of patient reach: Yes.

## 2021-10-31 NOTE — ED Provider Notes (Addendum)
[facility] Provider Note    None    (approximate)   History   Suicidal   HPI  Rohit Kadar Chance is a 31 y.o. male  here with suicidal ideation. Pt has a h/o depression, prior suicide attempt in 2018. Reports that he has had worsening depression recently, increasing significantly tonight with thoughts of wanting ot kill himself. He called the suicide hotline and was directed to come here. Reports that otherwise he has been well with the exception of "a cold" for the last month with cough. He has a h/o pneumonia per his report. No fevers.       Physical Exam   Triage Vital Signs: ED Triage Vitals  Enc Vitals Group     BP 10/31/21 2128 102/82     Pulse Rate 10/31/21 2128 85     Resp 10/31/21 2128 18     Temp 10/31/21 2128 98.1 F (36.7 C)     Temp src --      SpO2 10/31/21 2128 100 %     Weight 10/31/21 2127 (!) 350 lb (158.8 kg)     Height 10/31/21 2127 5\' 11"  (1.803 m)     Head Circumference --      Peak Flow --      Pain Score 10/31/21 2128 0     Pain Loc --      Pain Edu? --      Excl. in GC? --     Most recent vital signs: Vitals:   10/31/21 2128  BP: 102/82  Pulse: 85  Resp: 18  Temp: 98.1 F (36.7 C)  SpO2: 100%     General: Awake, no distress.  CV:  Good peripheral perfusion. RRR. Resp:  Normal effort. Course cough, lungs CTAB. Abd:  No distention.  Other:  Depressed mood, withdrawn.   ED Results / Procedures / Treatments   Labs (all labs ordered are listed, but only abnormal results are displayed) Labs Reviewed  COMPREHENSIVE METABOLIC PANEL - Abnormal; Notable for the following components:      Result Value   Glucose, Bld 106 (*)    All other components within normal limits  SALICYLATE LEVEL - Abnormal; Notable for the following components:   Salicylate Lvl <7.0 (*)    All other components within normal limits  ACETAMINOPHEN LEVEL - Abnormal; Notable for the following components:   Acetaminophen (Tylenol),  Serum <10 (*)    All other components within normal limits  CBC - Abnormal; Notable for the following components:   WBC 12.9 (*)    All other components within normal limits  SARS CORONAVIRUS 2 BY RT PCR  ETHANOL  URINE DRUG SCREEN, QUALITATIVE ([facility] ONLY)  TSH     EKG    RADIOLOGY CXR: No acute process   I also independently reviewed and agree with radiologist interpretations.   PROCEDURES:  Critical Care performed: No   MEDICATIONS ORDERED IN ED: Medications  buPROPion (WELLBUTRIN XL) 24 hr tablet 300 mg (has no administration in time range)  levothyroxine (SYNTHROID) tablet 200 mcg (has no administration in time range)     IMPRESSION / MDM / ASSESSMENT AND PLAN / ED COURSE  I reviewed the triage vital signs and the nursing notes.                              Ddx:  Differential includes the following, with pertinent life- or limb-threatening emergencies considered:  Depression  with suicidal ideation, MDD, substance-induced mood d/o  Patient's presentation is most consistent with acute presentation with potential threat to life or bodily function.  MDM:  31 yo M here with worsening depression, suicidal ideation. H/o suicide attempt in past, pt high risk. He is calm and cooperative and voluntary at this time. No apparent emergent medical condition. Labs reassuring. No leukocytosis. CMP unremarkable. COVID is negative. Will consult TTS/Psych. Pt remains VOLUNTARY but would IVC if he attempts to leave.  Pt reports cough as well - lungs CTAB. CXR clear. No signs of PNA.   MEDICATIONS GIVEN IN ED: Medications  buPROPion (WELLBUTRIN XL) 24 hr tablet 300 mg (has no administration in time range)  levothyroxine (SYNTHROID) tablet 200 mcg (has no administration in time range)     Consults:  Psych/TTS   EMR reviewed  Prior ED records     FINAL CLINICAL IMPRESSION(S) / ED DIAGNOSES   Final diagnoses:  Suicidal ideation     Rx / DC Orders   ED  Discharge Orders     None        Note:  This document was prepared using Dragon voice recognition software and may include unintentional dictation errors.   Duffy Bruce, MD 10/31/21 2316    Duffy Bruce, MD 10/31/21 [phone]

## 2021-10-31 NOTE — ED Triage Notes (Signed)
Pt presents via POV with complaints of SI for the last several days. Hx of depression for the last few years but tonight things "are much worse". Pt declines to elaborate more on his thoughts - wishes were respected. Denies specific plan to harm himself. Denies CP or SOB.

## 2021-10-31 NOTE — ED Notes (Signed)
Pt dressed out by this RN & EDT Urban Gibson) belongings include:   1 pair of socks 1 pair of sneakers 1 pair of blue and white shorts 1 burgundy top  Prescription medications (Wellbutrin & Synthroid) given to the patients Mom to take home.

## 2021-10-31 NOTE — ED Notes (Signed)
Patient alert and oriented, warm and dry, in no acute distress. Patient denies HI, AVH and pain. Patient made aware of Q15 minute rounds and security cameras for their safety. Patient instructed to come to this nurse with needs or concerns.

## 2021-11-01 DIAGNOSIS — F332 Major depressive disorder, recurrent severe without psychotic features: Secondary | ICD-10-CM

## 2021-11-01 NOTE — Consult Note (Signed)
Joe Haley Face-to-Face Psychiatry Consult   Reason for Consult: Suicidal Referring Physician: Dr. Erma Heritage Patient Identification: Joe Haley MRN:  [id number] Principal Diagnosis: <principal problem not specified> Diagnosis:  Active Problems:   Severe recurrent major depression without psychotic features (HCC)   Ibuprofen overdose   GAD (generalized anxiety disorder)   Morbid obesity (HCC)   RLS (restless legs syndrome)   Total Time spent with patient: 1 hour  Subjective: "I was feeling suicidal tonight. I called the crisis line before I did anything to myself." Joe Haley is a 31 y.o. male patient presented to [facility] ED via POV voluntarily. Per the ED triage nurse's note, Pt presents via POV with complaints of SI for the last several days. Hx of depression for the previous few years, but tonight things "are much worse". Pt declines to elaborate more on his thoughts - wishes were respected. Denies a specific plan to harm himself. This provider saw The patient face-to-face; the chart was reviewed, and consulted with Dr. Erma Heritage on 11/01/2021 due to the patient's care. It was discussed with the EDP that the patient does meet the criteria to be admitted to the psychiatric inpatient unit.  On evaluation, the patient is alert and oriented x 4, calm, cooperative, and mood-congruent with affect. The patient does not appear to be responding to internal or external stimuli. Neither is the patient presenting with any delusional thinking. The patient admits to auditory hallucinations but denies visual hallucinations. The patient admits to passive suicidal ideations but denies homicidal or self-harm ideations. The patient is not presenting with any psychotic or paranoid behaviors. During an encounter with the patient, he could answer questions appropriately.  HPI: Dr. Erma Heritage, Joe Haley is a 31 y.o. male  here with suicidal ideation. Pt has a h/o depression, prior suicide attempt in 2018.  Reports that he has had worsening depression recently, increasing significantly tonight with thoughts of wanting ot kill himself. He called the suicide hotline and was directed to come here. Reports that otherwise he has been well with the exception of "a cold" for the last month with cough. He has a h/o pneumonia per his report. No fevers.   Past Psychiatric History:  Ibuprofen overdose Major depressive disorder    Risk to Self:   Risk to Others:   Prior Inpatient Therapy:   Prior Outpatient Therapy:    Past Medical History:  Past Medical History:  Diagnosis Date   Acanthosis nigricans    Hyperlipidemia    Hypothyroidism    Ibuprofen overdose    Major depressive disorder    Obesity    Restless leg syndrome    No past surgical history on file. Family History:  Family History  Problem Relation Age of Onset   Depression Sister    Depression Maternal Grandmother    Diabetes Maternal Grandmother    Family Psychiatric  History: History reviewed. No pertinent family psychiatric history Social History:  Social History   Substance and Sexual Activity  Alcohol Use No     Social History   Substance and Sexual Activity  Drug Use No    Social History   Socioeconomic History   Marital status: Single    Spouse name: Not on file   Number of children: Not on file   Years of education: Not on file   Highest education level: Not on file  Occupational History   Not on file  Tobacco Use   Smoking status: Never   Smokeless tobacco: Never  Vaping Use  Vaping Use: Never used  Substance and Sexual Activity   Alcohol use: No   Drug use: No   Sexual activity: Not Currently  Other Topics Concern   Not on file  Social History Narrative   Not on file   Social Determinants of Health   Financial Resource Strain: Not on file  Food Insecurity: Not on file  Transportation Needs: Not on file  Physical Activity: Not on file  Stress: Not on file  Social Connections: Not on file    Additional Social History:    Allergies:  No Known Allergies  Labs:  Results for orders placed or performed during the hospital encounter of 10/31/21 (from the past 48 hour(s))  Comprehensive metabolic panel     Status: Abnormal   Collection Time: 10/31/21  9:31 PM  Result Value Ref Range   Sodium 139 135 - 145 mmol/L   Potassium 4.0 3.5 - 5.1 mmol/L   Chloride 106 98 - 111 mmol/L   CO2 24 22 - 32 mmol/L   Glucose, Bld 106 (H) 70 - 99 mg/dL    Comment: Glucose reference range applies only to samples taken after fasting for at least 8 hours.   BUN 14 6 - 20 mg/dL   Creatinine, Ser 1.610.93 0.61 - 1.24 mg/dL   Calcium 9.0 8.9 - 09.610.3 mg/dL   Total Protein 7.5 6.5 - 8.1 g/dL   Albumin 4.3 3.5 - 5.0 g/dL   AST 21 15 - 41 U/L   ALT 21 0 - 44 U/L   Alkaline Phosphatase 65 38 - 126 U/L   Total Bilirubin 0.9 0.3 - 1.2 mg/dL   GFR, Estimated >04>60 >54>60 mL/min    Comment: (NOTE) Calculated using the CKD-EPI Creatinine Equation (2021)    Anion gap 9 5 - 15    Comment: Performed at [facility], [address] [place], [place] [address]  Ethanol     Status: None   Collection Time: 10/31/21  9:31 PM  Result Value Ref Range   Alcohol, Ethyl (B) <10 <10 mg/dL    Comment: (NOTE) Lowest detectable limit for serum alcohol is 10 mg/dL.  For medical purposes only. Performed at [facility], [address] [place], [place] [address]   Salicylate level     Status: Abnormal   Collection Time: 10/31/21  9:31 PM  Result Value Ref Range   Salicylate Lvl <7.0 (L) 7.0 - 30.0 mg/dL    Comment: Performed at [facility], [address] [place], [place] [address]  Acetaminophen level     Status: Abnormal   Collection Time: 10/31/21  9:31 PM  Result Value Ref Range   Acetaminophen (Tylenol), Serum <10 (L) 10 - 30 ug/mL    Comment: (NOTE) Therapeutic concentrations vary significantly. A range of 10-30 ug/mL  may be an effective concentration for many patients.  However, some  are best treated at concentrations outside of this range. Acetaminophen concentrations >150 ug/mL at 4 hours after ingestion  and >50 ug/mL at 12 hours after ingestion are often associated with  toxic reactions.  Performed at [facility], [address] [place], [place] [address]   cbc     Status: Abnormal   Collection Time: 10/31/21  9:31 PM  Result Value Ref Range   WBC 12.9 (H) 4.0 - 10.5 K/uL   RBC 4.98 4.22 - 5.81 MIL/uL   Hemoglobin 15.0 13.0 - 17.0 g/dL   HCT 57.844.1 46.939.0 - 62.952.0 %   MCV 88.6 80.0 -  100.0 fL   MCH 30.1 26.0 - 34.0 pg   MCHC 34.0 30.0 - 36.0 g/dL   RDW 47.0 96.2 - 83.6 %   Platelets 338 150 - 400 K/uL   nRBC 0.0 0.0 - 0.2 %    Comment: Performed at [facility], [address] [place], [place] [address]  TSH     Status: None   Collection Time: 10/31/21  9:31 PM  Result Value Ref Range   TSH 0.718 0.350 - 4.500 uIU/mL    Comment: Performed by a 3rd Generation assay with a functional sensitivity of <=0.01 uIU/mL. Performed at [facility], [address] [place], [place] [address]   SARS Coronavirus 2 by RT PCR (hospital order, performed in [facility] hospital lab) *cepheid single result test* Anterior Nasal Swab     Status: None   Collection Time: 10/31/21  9:56 PM   Specimen: Anterior Nasal Swab  Result Value Ref Range   SARS Coronavirus 2 by RT PCR NEGATIVE NEGATIVE    Comment: (NOTE) SARS-CoV-2 target nucleic acids are NOT DETECTED.  The SARS-CoV-2 RNA is generally detectable in upper and lower respiratory specimens during the acute phase of infection. The lowest concentration of SARS-CoV-2 viral copies this assay can detect is 250 copies / mL. A negative result does not preclude SARS-CoV-2 infection and should not be used as the sole basis for treatment or other patient management decisions.  A negative result may occur with improper specimen collection / handling, submission of specimen other than  nasopharyngeal swab, presence of viral mutation(s) within the areas targeted by this assay, and inadequate number of viral copies (<250 copies / mL). A negative result must be combined with clinical observations, patient history, and epidemiological information.  Fact Sheet for Patients:   RoadLapTop.co.za  Fact Sheet for Healthcare Providers: http://kim-miller.com/  This test is not yet approved or  cleared by the [place] FDA and has been authorized for detection and/or diagnosis of SARS-CoV-2 by FDA under an Emergency Use Authorization (EUA).  This EUA will remain in effect (meaning this test can be used) for the duration of the COVID-19 declaration under Section 564(b)(1) of the Act, 21 U.S.C. section 360bbb-3(b)(1), unless the authorization is terminated or revoked sooner.  Performed at [facility], [address] [place], [place] [address]   Urine Drug Screen, Qualitative     Status: None   Collection Time: 10/31/21 11:14 PM  Result Value Ref Range   Tricyclic, Ur Screen NONE DETECTED NONE DETECTED   Amphetamines, Ur Screen NONE DETECTED NONE DETECTED   MDMA (Ecstasy)Ur Screen NONE DETECTED NONE DETECTED   Cocaine Metabolite,Ur [place] NONE DETECTED NONE DETECTED   Opiate, Ur Screen NONE DETECTED NONE DETECTED   Phencyclidine (PCP) Ur S NONE DETECTED NONE DETECTED   Cannabinoid 50 Ng, Ur [place] NONE DETECTED NONE DETECTED   Barbiturates, Ur Screen NONE DETECTED NONE DETECTED   Benzodiazepine, Ur Scrn NONE DETECTED NONE DETECTED   Methadone Scn, Ur NONE DETECTED NONE DETECTED    Comment: (NOTE) Tricyclics + metabolites, urine    Cutoff 1000 ng/mL Amphetamines + metabolites, urine  Cutoff 1000 ng/mL MDMA (Ecstasy), urine              Cutoff 500 ng/mL Cocaine Metabolite, urine          Cutoff 300 ng/mL Opiate + metabolites, urine        Cutoff 300 ng/mL Phencyclidine (PCP), urine         Cutoff 25  ng/mL Cannabinoid,  urine                 Cutoff 50 ng/mL Barbiturates + metabolites, urine  Cutoff 200 ng/mL Benzodiazepine, urine              Cutoff 200 ng/mL Methadone, urine                   Cutoff 300 ng/mL  The urine drug screen provides only a preliminary, unconfirmed analytical test result and should not be used for non-medical purposes. Clinical consideration and professional judgment should be applied to any positive drug screen result due to possible interfering substances. A more specific alternate chemical method must be used in order to obtain a confirmed analytical result. Gas chromatography / mass spectrometry (GC/MS) is the preferred confirm atory method. Performed at [facility], [address] [place], [place] [address]     Current Facility-Administered Medications  Medication Dose Route Frequency Provider Last Rate Last Admin   buPROPion (WELLBUTRIN XL) 24 hr tablet 300 mg  300 mg Oral Daily Duffy Bruce, MD       levothyroxine (SYNTHROID) tablet 200 mcg  200 mcg Oral Q0600 Renda Rolls, [facility]       Current Outpatient Medications  Medication Sig Dispense Refill   benzoyl peroxide 5 % external liquid Apply topically 2 (two) times daily. 142 g 0   buPROPion (WELLBUTRIN XL) 300 MG 24 hr tablet TAKE 1 TABLET BY MOUTH ONCE DAILY PLEASE  SCHEDULE  A  FOLLOW  UP  APPOINTMENT 90 tablet 0   levothyroxine (SYNTHROID) 200 MCG tablet Take 1 tablet (200 mcg total) by mouth daily. 90 tablet 1    Musculoskeletal: Strength & Muscle Tone: within normal limits Gait & Station: normal Patient leans: N/A  Psychiatric Specialty Exam:  Presentation  General Appearance: No data recorded Eye Contact:No data recorded Speech:No data recorded Speech Volume:No data recorded Handedness:No data recorded  Mood and Affect  Mood: Euthymic  Affect: Appropriate; Depressed   Thought Process  Thought Processes: Coherent  Descriptions of Associations:Intact  Orientation:Full  (Time, Place and Person)  Thought Content:Logical  History of Schizophrenia/Schizoaffective disorder:No  Duration of Psychotic Symptoms:No data recorded Hallucinations:Hallucinations: None; Auditory Description of Auditory Hallucinations: "I hear male screaming during the night when I am falling as sleep. It sounds like they are in trouble."  Ideas of Reference:None  Suicidal Thoughts:Suicidal Thoughts: Yes, Passive SI Passive Intent and/or Plan: Without Intent; Without Plan  Homicidal Thoughts:Homicidal Thoughts: No   Sensorium  Memory: Immediate Good; Recent Good; Remote Good  Judgment: Fair  Insight: Fair   Community education officer  Concentration: Good  Attention Span: Good  Recall: Good  Fund of Knowledge: Good  Language: Good   Psychomotor Activity  Psychomotor Activity: Psychomotor Activity: Normal   Assets  Assets: Communication Skills; Desire for Improvement; Financial Resources/Insurance; Resilience; Social Support   Sleep  Sleep: Sleep: Good   Physical Exam: Physical Exam Vitals and nursing note reviewed.  Constitutional:      Appearance: Normal appearance. He is obese.  HENT:     Head: Normocephalic and atraumatic.     Right Ear: External ear normal.     Left Ear: External ear normal.     Nose: Nose normal.     Mouth/Throat:     Mouth: Mucous membranes are moist.  Cardiovascular:     Rate and Rhythm: Normal rate.     Pulses: Normal pulses.  Pulmonary:     Effort: Pulmonary effort  is normal.  Musculoskeletal:        General: Normal range of motion.     Cervical back: Normal range of motion and neck supple.  Neurological:     General: No focal deficit present.     Mental Status: He is alert and oriented to person, place, and time.  Psychiatric:        Attention and Perception: Attention and perception normal.        Mood and Affect: Mood is depressed. Affect is blunt.        Speech: Speech normal.        Behavior: Behavior  normal. Behavior is cooperative.        Thought Content: Thought content includes suicidal ideation. Thought content includes suicidal plan.        Cognition and Memory: Cognition normal.        Judgment: Judgment is inappropriate.    Review of Systems  Psychiatric/Behavioral:  Positive for depression and suicidal ideas. The patient is nervous/anxious.   All other systems reviewed and are negative.  Blood pressure 102/82, pulse 85, temperature 98.1 F (36.7 C), resp. rate 18, height 5\' 11"  (1.803 m), weight (!) 158.8 kg, SpO2 100 %. Body mass index is 48.82 kg/m.  Treatment Plan Summary: Plan Patient does meet criteria for psychiatric inpatient admission  Disposition: Recommend psychiatric Inpatient admission when medically cleared. Supportive therapy provided about ongoing stressors.  , NP 11/01/2021 1:32 AM

## 2021-11-01 NOTE — BH Assessment (Signed)
Patient is under review at [place] received call from [place]. Awaiting details.

## 2021-11-01 NOTE — BH Assessment (Addendum)
Comprehensive Clinical Assessment (CCA) Note  11/01/2021 Joe Haley [id number] Recommendations for Services/Supports/Treatments: Consulted with Madaline Brilliant., NP, who determined pt. meets inpatient psychiatric criteria. Notified Dr. Erma Heritage and Thayer Ohm, RN of disposition recommendation.   Joe Haley is a 31 year old, English speaking, Caucasian male with PMH of MDD, recurrent, severe, and GAD. Pt also has a hx of an intentional overdose upon chart review. Pt is IVC'd. Per triage note: Pt presents via POV with complaints of SI for the last several days. Hx of depression for the last few years but tonight things "are much worse".  Upon assessment, Pt was visibly anxious. Of note, Pt had restless psychomotor activity throughout the interview. Pt did report that he lives with his parents and is employed as a Theatre stage manager at [employer]. Pt explained that his thoughts of SI had gotten worse over the last month and 1/2. When asked if he has a plan the pt. would not elaborate but also did not deny having a plan. Pt was forthcoming about attempting suicide 6/7 years ago. Pt had good judgement and adequate insight.  Pt had a depressed mood and an anxious affect. Pt denied HI and V/H. Pt endorsed vague SI and admitted to having disturbing auditory hallucinations of a girl screaming in distress.   Chief Complaint:  Chief Complaint  Patient presents with   Suicidal   Visit Diagnosis: Severe recurrent major depression without psychotic features (HCC)   GAD (generalized anxiety disorder)   Morbid obesity (HCC)   RLS (restless legs syndrome)    CCA Screening, Triage and Referral (STR)  Patient Reported Information How did you hear about [place]? Self  Referral name: No data recorded Referral phone number: No data recorded  Whom do you see for routine medical problems? No data recorded Practice/Facility Name: No data recorded Practice/Facility Phone Number: No data recorded Name of Contact: No data  recorded Contact Number: No data recorded Contact Fax Number: No data recorded Prescriber Name: No data recorded Prescriber Address (if known): No data recorded  What Is the Reason for Your Visit/Call Today? Pt presents via POV with complaints of SI for the last several days. Hx of depression for the last few years but tonight things "are much worse".  How Long Has This Been Causing You Problems? 1 wk - 1 month  What Do You Feel Would Help You the Most Today? Treatment for Depression or other mood problem   Have You Recently Been in Any Inpatient Treatment (Hospital/Detox/Crisis Center/28-Day Program)? No data recorded Name/Location of Program/Hospital:No data recorded How Long Were You There? No data recorded When Were You Discharged? No data recorded  Have You Ever Received Services From [facility] Before? No data recorded Who Do You See at [facility]? No data recorded  Have You Recently Had Any Thoughts About Hurting Yourself? Yes  Are You Planning to Commit Suicide/Harm Yourself At This time? No   Have you Recently Had Thoughts About Hurting Someone Karolee Ohs? No  Explanation: No data recorded  Have You Used Any Alcohol or Drugs in the Past 24 Hours? No  How Long Ago Did You Use Drugs or Alcohol? No data recorded What Did You Use and How Much? No data recorded  Do You Currently Have a Therapist/Psychiatrist? No  Name of Therapist/Psychiatrist: No data recorded  Have You Been Recently Discharged From Any Office Practice or Programs? No  Explanation of Discharge From Practice/Program: No data recorded    CCA Screening Triage Referral Assessment Type of Contact:  Face-to-Face  Is this Initial or Reassessment? No data recorded Date Telepsych consult ordered in [facility]:  No data recorded Time Telepsych consult ordered in [facility]:  No data recorded  Patient Reported Information Reviewed? No data recorded Patient Left Without Being Seen? No data recorded Reason for Not  Completing Assessment: No data recorded  Collateral Involvement: None provided   Does Patient Have a Court Appointed Legal Guardian? No data recorded Name and Contact of Legal Guardian: No data recorded If Minor and Not Living with Parent(s), Who has Custody? n/a  Is CPS involved or ever been involved? Never  Is APS involved or ever been involved? Never   Patient Determined To Be At Risk for Harm To Self or Others Based on Review of Patient Reported Information or Presenting Complaint? No  Method: No data recorded Availability of Means: No data recorded Intent: No data recorded Notification Required: No data recorded Additional Information for Danger to Others Potential: No data recorded Additional Comments for Danger to Others Potential: No data recorded Are There Guns or Other Weapons in Your Home? No data recorded Types of Guns/Weapons: No data recorded Are These Weapons Safely Secured?                            No data recorded Who Could Verify You Are Able To Have These Secured: No data recorded Do You Have any Outstanding Charges, Pending Court Dates, Parole/Probation? No data recorded Contacted To Inform of Risk of Harm To Self or Others: Other: Comment   Location of Assessment: [facility] ED   Does Patient Present under Involuntary Commitment? No  IVC Papers Initial File Date: No data recorded  [place] of Residence: [place]   Patient Currently Receiving the Following Services: Not Receiving Services   Determination of Need: Emergent (2 hours)   Options For Referral: Inpatient Hospitalization     CCA Biopsychosocial Intake/Chief Complaint:  No data recorded Current Symptoms/Problems: No data recorded  Patient Reported Schizophrenia/Schizoaffective Diagnosis in Past: No   Strengths: Pt is able receptive to recieving treatment; pt has stable housing; pt has good insight  Preferences: No data recorded Abilities: No data recorded  Type of Services Patient  Feels are Needed: No data recorded  Initial Clinical Notes/Concerns: No data recorded  Mental Health Symptoms Depression:   Hopelessness; Worthlessness; Fatigue   Duration of Depressive symptoms:  Greater than two weeks   Mania:   None   Anxiety:    Worrying; Tension; Restlessness   Psychosis:   Hallucinations   Duration of Psychotic symptoms:  Less than six months   Trauma:   N/A   Obsessions:   N/A   Compulsions:   N/A   Inattention:   None   Hyperactivity/Impulsivity:   None   Oppositional/Defiant Behaviors:   None   Emotional Irregularity:   Recurrent suicidal behaviors/gestures/threats   Other Mood/Personality Symptoms:  No data recorded   Mental Status Exam Appearance and self-care  Stature:   Average   Weight:   Obese   Clothing:   -- (Scrubs)   Grooming:   Normal   Cosmetic use:   None   Posture/gait:   Normal   Motor activity:   Restless   Sensorium  Attention:   Normal   Concentration:   Anxiety interferes   Orientation:   Person; Place; Object; Time; Situation   Recall/memory:   Normal   Affect and Mood  Affect:   Anxious   Mood:  Dysphoric   Relating  Eye contact:   Avoided   Facial expression:   Anxious   Attitude toward examiner:   Cooperative   Thought and Language  Speech flow:  Clear and Coherent   Thought content:   Appropriate to Mood and Circumstances   Preoccupation:   None   Hallucinations:   Auditory   Organization:  No data recorded  [employer] of Knowledge:   Average   Intelligence:   Average   Abstraction:   Normal   Judgement:   Good   Reality Testing:   Adequate   Insight:   Good   Decision Making:   Normal   Social Functioning  Social Maturity:   Isolates   Social Judgement:   Normal   Stress  Stressors:   Other (Comment) (Pt unable to identify)   Coping Ability:   Overwhelmed   Skill Deficits:   None   Supports:    Family; Support needed     Religion: Religion/Spirituality Are You A Religious Person?:  (Not assessed) How Might This Affect Treatment?: Not assessed  Leisure/Recreation: Leisure / Recreation Do You Have Hobbies?: No  Exercise/Diet: Exercise/Diet Do You Exercise?: No Have You Gained or Lost A Significant Amount of Weight in the Past Six Months?: No Do You Follow a Special Diet?: No Do You Have Any Trouble Sleeping?: No   CCA Employment/Education Employment/Work Situation: Employment / Work Situation Employment Situation: Employed Work Stressors: None noted Patient's Job has Been Impacted by Current Illness: No Has Patient ever Been in Passenger transport manager?: No  Education: Education Is Patient Currently Attending School?: No Did Physicist, medical?: No Did You Have An Individualized Education Program (IIEP): No Did You Have Any Difficulty At [employer]?: No Patient's Education Has Been Impacted by Current Illness: No   CCA Family/Childhood History Family and Relationship History: Family history Marital status: Single Does patient have children?: No  Childhood History:  Childhood History By whom was/is the patient raised?: Both parents Did patient suffer any verbal/emotional/physical/sexual abuse as a child?: No Did patient suffer from severe childhood neglect?: No Has patient ever been sexually abused/assaulted/raped as an adolescent or adult?: No Was the patient ever a victim of a crime or a disaster?: No Witnessed domestic violence?: No Has patient been affected by domestic violence as an adult?: No  Child/Adolescent Assessment:     CCA Substance Use Alcohol/Drug Use: Alcohol / Drug Use Pain Medications: See MAR Prescriptions: See MAR Over the Counter: See MAR History of alcohol / drug use?: No history of alcohol / drug abuse                         ASAM's:  Six Dimensions of Multidimensional Assessment  Dimension 1:  Acute Intoxication and/or  Withdrawal Potential:      Dimension 2:  Biomedical Conditions and Complications:      Dimension 3:  Emotional, Behavioral, or Cognitive Conditions and Complications:     Dimension 4:  Readiness to Change:     Dimension 5:  Relapse, Continued use, or Continued Problem Potential:     Dimension 6:  Recovery/Living Environment:     ASAM Severity Score:    ASAM Recommended Level of Treatment:     Substance use Disorder (SUD)    Recommendations for Services/Supports/Treatments:    DSM5 Diagnoses: Patient Active Problem List   Diagnosis Date Noted   Sinus drainage 03/23/2020   Allergic conjunctivitis 09/09/2019   Severe recurrent  major depression without psychotic features (HCC) 03/28/2016   Ibuprofen overdose 03/28/2016   GAD (generalized anxiety disorder) 06/24/2014   Acquired hypothyroidism 04/02/2014   Hyperlipidemia 08/12/2013   RLS (restless legs syndrome) 08/11/2013   Acanthosis nigricans 07/02/2013   Morbid obesity (HCC) 07/02/2013   Kristi Norment R Copeland, LCAS

## 2021-11-01 NOTE — ED Notes (Signed)
PT  GOING  TO  [place]

## 2021-11-01 NOTE — BH Assessment (Signed)
Referral information for Psychiatric Hospitalization faxed to:  [facility] [phone]),  [facility] [phone] -or- [phone]),   [facility] [phone])  [facility] [phone])  [facility] [phone])

## 2021-11-01 NOTE — ED Notes (Signed)
Patient voiced understanding of discharge instructions, no signs of distress, report was called, attempted to call family, no answer to let know that he was being transported, all belongings sent with him.

## 2021-11-01 NOTE — ED Provider Notes (Signed)
Emergency Medicine Observation Re-evaluation Note  Joe Haley is a 31 y.o. male, seen on rounds today.  Pt initially presented to the ED for complaints of Suicidal   Physical Exam  BP 102/82 (BP Location: Left Arm)   Pulse 85   Temp 98.1 F (36.7 C)   Resp 18   Ht 5\' 11"  (1.803 m)   Wt (!) 158.8 kg   SpO2 100%   BMI 48.82 kg/m  Physical Exam General: NAD  ED Course / MDM  EKG:   I have reviewed the labs performed to date as well as medications administered while in observation.  Recent changes in the last 24 hours include none.  Plan  Current plan is for psych/toc   Merlyn Lot, MD 11/01/21 [phone]

## 2021-11-01 NOTE — BH Assessment (Signed)
Patient has been accepted to [facility] on today 11/01/21. Patient assigned to [facility]. Accepting physician is Dr. Cammie Mcgee.  Call report to [phone].  Representative was [employer].   ER Staff is aware of it:  Lattie Haw, ER Secretary  Dr. Quentin Cornwall, ER MD  Abigail Butts, Patient's Nurse

## 2021-11-02 ENCOUNTER — Telehealth: Payer: Self-pay

## 2021-11-02 NOTE — Telephone Encounter (Signed)
Transition Care Management Unsuccessful Follow-up Telephone Call  Date of discharge and from where:  11/01/2021 from [facility]  Attempts:  1st Attempt  Reason for unsuccessful TCM follow-up call:  Left voice message

## 2021-11-03 NOTE — Telephone Encounter (Signed)
Transition Care Management Unsuccessful Follow-up Telephone Call  Date of discharge and from where:  11/01/21 from [facility]  Attempts:  2nd Attempt  Reason for unsuccessful TCM follow-up call:  Left voice message

## 2021-11-04 ENCOUNTER — Telehealth: Payer: Self-pay

## 2021-11-04 NOTE — Telephone Encounter (Signed)
Transition Care Management Unsuccessful Follow-up Telephone Call  Date of discharge and from where:  11/01/21. [facility]  Attempts:  3rd Attempt  Reason for unsuccessful TCM follow-up call:  Left voice message

## 2021-12-01 ENCOUNTER — Other Ambulatory Visit: Payer: Self-pay | Admitting: Nurse Practitioner

## 2021-12-02 NOTE — Telephone Encounter (Signed)
Requested Prescriptions  Pending Prescriptions Disp Refills   levothyroxine (SYNTHROID) 200 MCG tablet [Pharmacy Med Name: Levothyroxine Sodium 200 MCG Oral Tablet] 90 tablet 3    Sig: Take 1 tablet by mouth once daily     Endocrinology:  Hypothyroid Agents Passed - 12/01/2021  7:00 PM      Passed - TSH in normal range and within 360 days    TSH  Date Value Ref Range Status  10/31/2021 0.718 0.350 - 4.500 uIU/mL Final    Comment:    Performed by a 3rd Generation assay with a functional sensitivity of <=0.01 uIU/mL. Performed at [facility], [address] [place], [place] [address]   04/12/2021 2.290 0.450 - 4.500 uIU/mL Final         Passed - Valid encounter within last 12 months    Recent Outpatient Visits           5 months ago Acute maxillary sinusitis, recurrence not specified   [place], Dani Gobble, PA-C   7 months ago Acquired hypothyroidism   [facility] Jon Billings, NP   11 months ago Severe recurrent major depression without psychotic features ([place])   [facility] Jon Billings, NP   1 year ago Severe recurrent major depression without psychotic features ([place])   [facility] Jon Billings, NP   1 year ago Severe recurrent major depression without psychotic features [facility])   [facility] Jon Billings, NP

## 2021-12-05 ENCOUNTER — Encounter: Payer: Self-pay | Admitting: Nurse Practitioner

## 2021-12-05 ENCOUNTER — Ambulatory Visit: Payer: Self-pay | Admitting: Nurse Practitioner

## 2021-12-05 VITALS — BP 113/70 | HR 73 | Temp 98.1°F | Wt 364.5 lb

## 2021-12-05 DIAGNOSIS — F332 Major depressive disorder, recurrent severe without psychotic features: Secondary | ICD-10-CM

## 2021-12-05 DIAGNOSIS — H66001 Acute suppurative otitis media without spontaneous rupture of ear drum, right ear: Secondary | ICD-10-CM

## 2021-12-05 DIAGNOSIS — E039 Hypothyroidism, unspecified: Secondary | ICD-10-CM

## 2021-12-05 MED ORDER — AMOXICILLIN 500 MG PO CAPS: 500.0000 mg | ORAL_CAPSULE | Freq: Two times a day (BID) | ORAL | 0 refills | Status: AC

## 2021-12-05 NOTE — Progress Notes (Unsigned)
BP 113/70   Pulse 73   Temp 98.1 F (36.7 C) (Oral)   Wt (!) 364 lb 8 oz (165.3 kg)   SpO2 96%   BMI 50.84 kg/m    Subjective:    Patient ID: Joe Haley, male    DOB: 23-Jul-1990, 31 y.o.   MRN: [id number]  HPI: Joe Haley is a 31 y.o. male  Chief Complaint  Patient presents with   Cough    Cough ongoing x 2 months, pt reports having sinus congestion, coughing up phlegm, runny nose. Patient declined covid testing.    UPPER RESPIRATORY TRACT INFECTION Worst symptom: symptoms have been ongoing for 2 months Fever: no Cough: yes Shortness of breath:  sometimes Wheezing: no Chest pain: no Chest tightness: no Chest congestion: no Nasal congestion: yes Runny nose: yes Post nasal drip: yes Sneezing: yes Sore throat: yes Swollen glands: no Sinus pressure: no Headache: yes Face pain: no Toothache: no Ear pain: no bilateral Ear pressure: no bilateral Eyes red/itching:no Eye drainage/crusting:  watery sometimes   Vomiting: no Rash: no Fatigue: yes Sick contacts: no Strep contacts: no  Context: stable Recurrent sinusitis: no Relief with OTC cold/cough medications: no  Treatments attempted: cough syrup   MOOD Patient states he has been "meh".  He is taking the wellbutrin 300mg .  States he was put on lexapro in the hospital.  He stopped taking the medication because he ran out.  States he didn't notice a difference with the medication.  Declines restarting it today.  Denies SI.  He has had some SI recently.  Has not seen a psychiatrist.  Didn't want to do group therapy at [facility].     Relevant past medical, surgical, family and social history reviewed and updated as indicated. Interim medical history since our last visit reviewed. Allergies and medications reviewed and updated.  Review of Systems  Per HPI unless specifically indicated above     Objective:    BP 113/70   Pulse 73   Temp 98.1 F (36.7 C) (Oral)   Wt (!) 364 lb 8 oz (165.3 kg)    SpO2 96%   BMI 50.84 kg/m   Wt Readings from Last 3 Encounters:  12/05/21 (!) 364 lb 8 oz (165.3 kg)  10/31/21 (!) 350 lb (158.8 kg)  06/16/21 (!) 356 lb (161.5 kg)    Physical Exam  Results for orders placed or performed during the hospital encounter of 10/31/21  SARS Coronavirus 2 by RT PCR (hospital order, performed in [place] hospital lab) *cepheid single result test* Anterior Nasal Swab   Specimen: Anterior Nasal Swab  Result Value Ref Range   SARS Coronavirus 2 by RT PCR NEGATIVE NEGATIVE  Comprehensive metabolic panel  Result Value Ref Range   Sodium 139 135 - 145 mmol/L   Potassium 4.0 3.5 - 5.1 mmol/L   Chloride 106 98 - 111 mmol/L   CO2 24 22 - 32 mmol/L   Glucose, Bld 106 (H) 70 - 99 mg/dL   BUN 14 6 - 20 mg/dL   Creatinine, Ser 0.93 0.61 - 1.24 mg/dL   Calcium 9.0 8.9 - 10.3 mg/dL   Total Protein 7.5 6.5 - 8.1 g/dL   Albumin 4.3 3.5 - 5.0 g/dL   AST 21 15 - 41 U/L   ALT 21 0 - 44 U/L   Alkaline Phosphatase 65 38 - 126 U/L   Total Bilirubin 0.9 0.3 - 1.2 mg/dL   GFR, Estimated >60 >60 mL/min   Anion gap  9 5 - 15  Ethanol  Result Value Ref Range   Alcohol, Ethyl (B) <10 <10 mg/dL  Salicylate level  Result Value Ref Range   Salicylate Lvl <7.0 (L) 7.0 - 30.0 mg/dL  Acetaminophen level  Result Value Ref Range   Acetaminophen (Tylenol), Serum <10 (L) 10 - 30 ug/mL  cbc  Result Value Ref Range   WBC 12.9 (H) 4.0 - 10.5 K/uL   RBC 4.98 4.22 - 5.81 MIL/uL   Hemoglobin 15.0 13.0 - 17.0 g/dL   HCT 02.7 25.3 - 66.4 %   MCV 88.6 80.0 - 100.0 fL   MCH 30.1 26.0 - 34.0 pg   MCHC 34.0 30.0 - 36.0 g/dL   RDW 40.3 47.4 - 25.9 %   Platelets 338 150 - 400 K/uL   nRBC 0.0 0.0 - 0.2 %  Urine Drug Screen, Qualitative  Result Value Ref Range   Tricyclic, Ur Screen NONE DETECTED NONE DETECTED   Amphetamines, Ur Screen NONE DETECTED NONE DETECTED   MDMA (Ecstasy)Ur Screen NONE DETECTED NONE DETECTED   Cocaine Metabolite,Ur [place] NONE DETECTED NONE DETECTED   Opiate, Ur  Screen NONE DETECTED NONE DETECTED   Phencyclidine (PCP) Ur S NONE DETECTED NONE DETECTED   Cannabinoid 50 Ng, Ur [place] NONE DETECTED NONE DETECTED   Barbiturates, Ur Screen NONE DETECTED NONE DETECTED   Benzodiazepine, Ur Scrn NONE DETECTED NONE DETECTED   Methadone Scn, Ur NONE DETECTED NONE DETECTED  TSH  Result Value Ref Range   TSH 0.718 0.350 - 4.500 uIU/mL      Assessment & Plan:   Problem List Items Addressed This Visit   None    Follow up plan: No follow-ups on file.

## 2021-12-06 NOTE — Assessment & Plan Note (Signed)
Chronic. Ongoing. Not well controlled.  Recently Hospitalized for SI.  Lexapro was added to his regimen but he stopped taking it when he ran out.  Declined restarting medication, felt like it was not helpful.  Declined increasing dose of medication.  Has not followed up with [facility] as recommended on discharge.  States he can't afford to go some place else.  Encouraged patient to make an appt with [facility].  Does endorse SI however none today in office.  States he does not have a plan to harm himself.  Discussed returning to the hospital if symptoms worsen.  Follow up in 1 month.  Call sooner if concerns arise.

## 2021-12-06 NOTE — Assessment & Plan Note (Signed)
Chronic. Reviewed labs from the hospital which are well controlled. Will refill medication for patient during visit.  Follow up in 6 months.  Call sooner if concerns arise.

## 2021-12-06 NOTE — Assessment & Plan Note (Signed)
Recommend a healthy lifestyle through diet and exercise.  °

## 2021-12-19 NOTE — Progress Notes (Unsigned)
There were no vitals taken for this visit.   Subjective:    Patient ID: Joe Haley, male    DOB: 1990-09-23, 31 y.o.   MRN: [id number]  HPI: Joe Haley is a 31 y.o. male  No chief complaint on file.  WRIST PAIN  Duration: {Blank single:19197::"chronic","days","weeks","months"} Involved wrist: {Blank single:19197::"left","right","bilateral"} Mechanism of injury:  {Blank single:19197::"FOOSH","no trauma","unknown"} Location: {Blank single:19197::"dorsal","volar","lateral","medial","radial","ulnar","diffuse"} Onset: {Blank single:19197::"sudden","gradual"} Severity: {Blank single:19197::"mild","moderate","severe","1/10","2/10","3/10","4/10","5/10","6/10","7/10","8/10","9/10","10/10"}  Quality:  {Blank multiple:19196::"sharp","dull","aching","burning","cramping","ill-defined","itchy","pressure-like","pulling","shooting","sore","stabbing","tender","tearing","throbbing"} Frequency: {Blank single:19197::"constant","intermittent","occasional","rare","every few minutes","a few times a hour","a few times a day","a few times a week","a few times a month","a few times a year"} Radiation: {Blank single:19197::"yes","no"} Aggravating factors: {Blank multiple:19196::"nothing","typing","movement","sleeping","gripping"}  Alleviating factors: {Blank multiple:19196::"nothing","ice","physical therapy","HEP","APAP","NSAIDs","brace","rest"}  Status: {Blank multiple:19196::"better","worse","stable","fluctuating"} Treatments attempted: {Blank multiple:19196::"none","rest","ice","heat","APAP","ibuprofen","aleve","physical therapy","HEP"}    Relief with NSAIDs?:  {Blank single:19197::"No NSAIDs Taken","no","mild","moderate","significant"} Weakness: {Blank single:19197::"yes","no"} Numbness: {Blank single:19197::"yes","no"} {Blank single:19197::"diffuse","Median nerve distribution","ulnar nerve distribution"} Redness: {Blank single:19197::"yes","no"} Bruising: {Blank  single:19197::"yes","no"} Swelling: {Blank single:19197::"yes","no"} Fevers: {Blank single:19197::"yes","no"}  Relevant past medical, surgical, family and social history reviewed and updated as indicated. Interim medical history since our last visit reviewed. Allergies and medications reviewed and updated.  Review of Systems  Per HPI unless specifically indicated above     Objective:    There were no vitals taken for this visit.  Wt Readings from Last 3 Encounters:  12/05/21 (!) 364 lb 8 oz (165.3 kg)  10/31/21 (!) 350 lb (158.8 kg)  06/16/21 (!) 356 lb (161.5 kg)    Physical Exam  Results for orders placed or performed during the hospital encounter of 10/31/21  SARS Coronavirus 2 by RT PCR (hospital order, performed in [place] hospital lab) *cepheid single result test* Anterior Nasal Swab   Specimen: Anterior Nasal Swab  Result Value Ref Range   SARS Coronavirus 2 by RT PCR NEGATIVE NEGATIVE  Comprehensive metabolic panel  Result Value Ref Range   Sodium 139 135 - 145 mmol/L   Potassium 4.0 3.5 - 5.1 mmol/L   Chloride 106 98 - 111 mmol/L   CO2 24 22 - 32 mmol/L   Glucose, Bld 106 (H) 70 - 99 mg/dL   BUN 14 6 - 20 mg/dL   Creatinine, Ser 0.93 0.61 - 1.24 mg/dL   Calcium 9.0 8.9 - 10.3 mg/dL   Total Protein 7.5 6.5 - 8.1 g/dL   Albumin 4.3 3.5 - 5.0 g/dL   AST 21 15 - 41 U/L   ALT 21 0 - 44 U/L   Alkaline Phosphatase 65 38 - 126 U/L   Total Bilirubin 0.9 0.3 - 1.2 mg/dL   GFR, Estimated >60 >60 mL/min   Anion gap 9 5 - 15  Ethanol  Result Value Ref Range   Alcohol, Ethyl (B) Q000111Q Q000111Q mg/dL  Salicylate level  Result Value Ref Range   Salicylate Lvl Q000111Q (L) 7.0 - 30.0 mg/dL  Acetaminophen level  Result Value Ref Range   Acetaminophen (Tylenol), Serum <10 (L) 10 - 30 ug/mL  cbc  Result Value Ref Range   WBC 12.9 (H) 4.0 - 10.5 K/uL   RBC 4.98 4.22 - 5.81 MIL/uL   Hemoglobin 15.0 13.0 - 17.0 g/dL   HCT 44.1 39.0 - 52.0 %   MCV 88.6 80.0 - 100.0 fL   MCH  30.1 26.0 - 34.0 pg   MCHC 34.0 30.0 - 36.0 g/dL   RDW 14.0 11.5 - 15.5 %   Platelets 338 150 - 400 K/uL   nRBC 0.0 0.0 - 0.2 %  Urine Drug Screen, Qualitative  Result Value  Ref Range   Tricyclic, Ur Screen NONE DETECTED NONE DETECTED   Amphetamines, Ur Screen NONE DETECTED NONE DETECTED   MDMA (Ecstasy)Ur Screen NONE DETECTED NONE DETECTED   Cocaine Metabolite,Ur [place] NONE DETECTED NONE DETECTED   Opiate, Ur Screen NONE DETECTED NONE DETECTED   Phencyclidine (PCP) Ur S NONE DETECTED NONE DETECTED   Cannabinoid 50 Ng, Ur [place] NONE DETECTED NONE DETECTED   Barbiturates, Ur Screen NONE DETECTED NONE DETECTED   Benzodiazepine, Ur Scrn NONE DETECTED NONE DETECTED   Methadone Scn, Ur NONE DETECTED NONE DETECTED  TSH  Result Value Ref Range   TSH 0.718 0.350 - 4.500 uIU/mL      Assessment & Plan:   Problem List Items Addressed This Visit   None    Follow up plan: No follow-ups on file.

## 2021-12-20 ENCOUNTER — Encounter: Payer: Self-pay | Admitting: Nurse Practitioner

## 2021-12-20 ENCOUNTER — Ambulatory Visit: Payer: Self-pay | Admitting: Nurse Practitioner

## 2021-12-20 VITALS — BP 127/84 | HR 86 | Temp 98.3°F | Wt 364.0 lb

## 2021-12-20 DIAGNOSIS — M778 Other enthesopathies, not elsewhere classified: Secondary | ICD-10-CM

## 2021-12-20 MED ORDER — IBUPROFEN 600 MG PO TABS: 600.0000 mg | ORAL_TABLET | Freq: Three times a day (TID) | ORAL | 0 refills | Status: DC | PRN

## 2021-12-20 MED ORDER — METHYLPREDNISOLONE 4 MG PO TBPK: ORAL_TABLET | ORAL | 0 refills | Status: DC

## 2022-05-02 ENCOUNTER — Ambulatory Visit: Payer: Self-pay | Admitting: Family Medicine

## 2022-05-02 NOTE — Progress Notes (Deleted)
There were no vitals taken for this visit.   Subjective:    Patient ID: Joe Haley, male    DOB: Sep 14, 1990, 32 y.o.   MRN: [id number]  HPI: Joe Haley is a 32 y.o. male  No chief complaint on file.   Relevant past medical, surgical, family and social history reviewed and updated as indicated. Interim medical history since our last visit reviewed. Allergies and medications reviewed and updated.  Review of Systems  Per HPI unless specifically indicated above     Objective:    There were no vitals taken for this visit.  Wt Readings from Last 3 Encounters:  12/20/21 (!) 364 lb (165.1 kg)  12/05/21 (!) 364 lb 8 oz (165.3 kg)  10/31/21 (!) 350 lb (158.8 kg)    Physical Exam  Results for orders placed or performed during the hospital encounter of 10/31/21  SARS Coronavirus 2 by RT PCR (hospital order, performed in [place] hospital lab) *cepheid single result test* Anterior Nasal Swab   Specimen: Anterior Nasal Swab  Result Value Ref Range   SARS Coronavirus 2 by RT PCR NEGATIVE NEGATIVE  Comprehensive metabolic panel  Result Value Ref Range   Sodium 139 135 - 145 mmol/L   Potassium 4.0 3.5 - 5.1 mmol/L   Chloride 106 98 - 111 mmol/L   CO2 24 22 - 32 mmol/L   Glucose, Bld 106 (H) 70 - 99 mg/dL   BUN 14 6 - 20 mg/dL   Creatinine, Ser 0.93 0.61 - 1.24 mg/dL   Calcium 9.0 8.9 - 10.3 mg/dL   Total Protein 7.5 6.5 - 8.1 g/dL   Albumin 4.3 3.5 - 5.0 g/dL   AST 21 15 - 41 U/L   ALT 21 0 - 44 U/L   Alkaline Phosphatase 65 38 - 126 U/L   Total Bilirubin 0.9 0.3 - 1.2 mg/dL   GFR, Estimated >60 >60 mL/min   Anion gap 9 5 - 15  Ethanol  Result Value Ref Range   Alcohol, Ethyl (B) Q000111Q Q000111Q mg/dL  Salicylate level  Result Value Ref Range   Salicylate Lvl Q000111Q (L) 7.0 - 30.0 mg/dL  Acetaminophen level  Result Value Ref Range   Acetaminophen (Tylenol), Serum <10 (L) 10 - 30 ug/mL  cbc  Result Value Ref Range   WBC 12.9 (H) 4.0 - 10.5 K/uL   RBC 4.98  4.22 - 5.81 MIL/uL   Hemoglobin 15.0 13.0 - 17.0 g/dL   HCT 44.1 39.0 - 52.0 %   MCV 88.6 80.0 - 100.0 fL   MCH 30.1 26.0 - 34.0 pg   MCHC 34.0 30.0 - 36.0 g/dL   RDW 14.0 11.5 - 15.5 %   Platelets 338 150 - 400 K/uL   nRBC 0.0 0.0 - 0.2 %  Urine Drug Screen, Qualitative  Result Value Ref Range   Tricyclic, Ur Screen NONE DETECTED NONE DETECTED   Amphetamines, Ur Screen NONE DETECTED NONE DETECTED   MDMA (Ecstasy)Ur Screen NONE DETECTED NONE DETECTED   Cocaine Metabolite,Ur [place] NONE DETECTED NONE DETECTED   Opiate, Ur Screen NONE DETECTED NONE DETECTED   Phencyclidine (PCP) Ur S NONE DETECTED NONE DETECTED   Cannabinoid 50 Ng, Ur [place] NONE DETECTED NONE DETECTED   Barbiturates, Ur Screen NONE DETECTED NONE DETECTED   Benzodiazepine, Ur Scrn NONE DETECTED NONE DETECTED   Methadone Scn, Ur NONE DETECTED NONE DETECTED  TSH  Result Value Ref Range   TSH 0.718 0.350 - 4.500 uIU/mL  Assessment & Plan:   Problem List Items Addressed This Visit   None    Follow up plan: No follow-ups on file.

## 2022-09-05 ENCOUNTER — Encounter: Payer: Self-pay | Admitting: Family Medicine

## 2022-09-05 ENCOUNTER — Ambulatory Visit (INDEPENDENT_AMBULATORY_CARE_PROVIDER_SITE_OTHER): Payer: Self-pay | Admitting: Family Medicine

## 2022-09-05 VITALS — BP 118/80 | HR 63 | Temp 97.5°F | Wt 388.4 lb

## 2022-09-05 DIAGNOSIS — Z5989 Other problems related to housing and economic circumstances: Secondary | ICD-10-CM

## 2022-09-05 DIAGNOSIS — E039 Hypothyroidism, unspecified: Secondary | ICD-10-CM

## 2022-09-05 DIAGNOSIS — F332 Major depressive disorder, recurrent severe without psychotic features: Secondary | ICD-10-CM

## 2022-09-05 MED ORDER — LEVOTHYROXINE SODIUM 200 MCG PO TABS: 200.0000 ug | ORAL_TABLET | Freq: Every day | ORAL | 1 refills | Status: DC

## 2022-09-05 NOTE — Progress Notes (Signed)
BP 118/80   Pulse 63   Temp (!) 97.5 F (36.4 C) (Oral)   Wt (!) 388 lb 6.4 oz (176.2 kg)   SpO2 94%   BMI 54.17 kg/m    Subjective:    Patient ID: Joe Haley, male    DOB: Jan 10, 1991, 32 y.o.   MRN: [id number]  HPI: Joe Haley is a 32 y.o. male  Chief Complaint  Patient presents with   Anxiety   Depression   Hypothyroidism   HYPOTHYROIDISM- has been off his thyroid medicine for 3 months Thyroid control status:uncontrolled Satisfied with current treatment? no Medication side effects: no Medication compliance: poor compliance Recent dose adjustment:no Fatigue: yes Cold intolerance: yes Heat intolerance: no Weight gain: yes Weight loss: no Constipation: yes Diarrhea/loose stools: no Palpitations: yes Lower extremity edema: yes Anxiety/depressed mood: yes  DEPRESSION- stopped the wellbutrin some time in the spring Mood status: uncontrolled Satisfied with current treatment?: no Symptom severity: severe  Duration of current treatment : has not been on anything for months Side effects: yes- "blunting" on wellbutrin Medication compliance: poor compliance Psychotherapy/counseling: yes in the past Previous psychiatric medications: wellbutrin Depressed mood: yes Anxious mood: yes Anhedonia: yes Significant weight loss or gain: yes Insomnia: yes hard to fall asleep Fatigue: yes Feelings of worthlessness or guilt: yes Impaired concentration/indecisiveness: yes Suicidal ideations: yes Hopelessness: yes Crying spells: no    09/05/2022    9:30 AM 12/20/2021    9:18 AM 12/05/2021    4:52 PM 04/12/2021   10:31 AM 01/04/2021   10:46 AM  Depression screen PHQ 2/9  Decreased Interest 2 2 3 1 1   Down, Depressed, Hopeless 2 1 1 1 1   PHQ - 2 Score 4 3 4 2 2   Altered sleeping 3 3 3 1 1   Tired, decreased energy 3 3 3 1 1   Change in appetite 3 3 3 1 2   Feeling bad or failure about yourself  3 1 1 1 1   Trouble concentrating 3 1 1 1 1   Moving slowly or  fidgety/restless 3 2 1 1 1   Suicidal thoughts 2 1 2 1 1   PHQ-9 Score 24 17 18 9 10   Difficult doing work/chores Very difficult Somewhat difficult Very difficult Somewhat difficult      Relevant past medical, surgical, family and social history reviewed and updated as indicated. Interim medical history since our last visit reviewed. Allergies and medications reviewed and updated.  Review of Systems  Constitutional:  Positive for fatigue and unexpected weight change. Negative for activity change, appetite change, chills, diaphoresis and fever.  HENT: Negative.    Respiratory: Negative.    Cardiovascular: Negative.   Gastrointestinal:  Positive for abdominal distention and constipation. Negative for abdominal pain, anal bleeding, blood in stool, diarrhea, nausea, rectal pain and vomiting.  Musculoskeletal: Negative.   Skin: Negative.   Neurological: Negative.   Psychiatric/Behavioral:  Positive for dysphoric mood. Negative for agitation, behavioral problems, confusion, decreased concentration, hallucinations, self-injury, sleep disturbance and suicidal ideas. The patient is nervous/anxious. The patient is not hyperactive.     Per HPI unless specifically indicated above     Objective:    BP 118/80   Pulse 63   Temp (!) 97.5 F (36.4 C) (Oral)   Wt (!) 388 lb 6.4 oz (176.2 kg)   SpO2 94%   BMI 54.17 kg/m   Wt Readings from Last 3 Encounters:  09/05/22 (!) 388 lb 6.4 oz (176.2 kg)  12/20/21 (!) 364 lb (165.1 kg)  12/05/21 Marland Kitchen)  364 lb 8 oz (165.3 kg)    Physical Exam Vitals and nursing note reviewed.  Constitutional:      General: He is not in acute distress.    Appearance: Normal appearance. He is obese. He is not ill-appearing, toxic-appearing or diaphoretic.  HENT:     Head: Normocephalic and atraumatic.     Right Ear: External ear normal.     Left Ear: External ear normal.     Nose: Nose normal.     Mouth/Throat:     Mouth: Mucous membranes are moist.     Pharynx:  Oropharynx is clear.  Eyes:     General: No scleral icterus.       Right eye: No discharge.        Left eye: No discharge.     Extraocular Movements: Extraocular movements intact.     Conjunctiva/sclera: Conjunctivae normal.     Pupils: Pupils are equal, round, and reactive to light.  Cardiovascular:     Rate and Rhythm: Normal rate and regular rhythm.     Pulses: Normal pulses.     Heart sounds: Normal heart sounds. No murmur heard.    No friction rub. No gallop.  Pulmonary:     Effort: Pulmonary effort is normal. No respiratory distress.     Breath sounds: Normal breath sounds. No stridor. No wheezing, rhonchi or rales.  Chest:     Chest wall: No tenderness.  Musculoskeletal:        General: Normal range of motion.     Cervical back: Normal range of motion and neck supple.  Skin:    General: Skin is warm and dry.     Capillary Refill: Capillary refill takes less than 2 seconds.     Coloration: Skin is not jaundiced or pale.     Findings: No bruising, erythema, lesion or rash.  Neurological:     General: No focal deficit present.     Mental Status: He is alert and oriented to person, place, and time. Mental status is at baseline.  Psychiatric:        Mood and Affect: Mood normal.        Behavior: Behavior normal.        Thought Content: Thought content normal.        Judgment: Judgment normal.     Results for orders placed or performed during the hospital encounter of 10/31/21  SARS Coronavirus 2 by RT PCR (hospital order, performed in [facility] hospital lab) *cepheid single result test* Anterior Nasal Swab   Specimen: Anterior Nasal Swab  Result Value Ref Range   SARS Coronavirus 2 by RT PCR NEGATIVE NEGATIVE  Comprehensive metabolic panel  Result Value Ref Range   Sodium 139 135 - 145 mmol/L   Potassium 4.0 3.5 - 5.1 mmol/L   Chloride 106 98 - 111 mmol/L   CO2 24 22 - 32 mmol/L   Glucose, Bld 106 (H) 70 - 99 mg/dL   BUN 14 6 - 20 mg/dL   Creatinine, Ser 8.46  0.61 - 1.24 mg/dL   Calcium 9.0 8.9 - 96.2 mg/dL   Total Protein 7.5 6.5 - 8.1 g/dL   Albumin 4.3 3.5 - 5.0 g/dL   AST 21 15 - 41 U/L   ALT 21 0 - 44 U/L   Alkaline Phosphatase 65 38 - 126 U/L   Total Bilirubin 0.9 0.3 - 1.2 mg/dL   GFR, Estimated >95 >28 mL/min   Anion gap 9 5 - 15  Ethanol  Result Value Ref Range   Alcohol, Ethyl (B) <10 <10 mg/dL  Salicylate level  Result Value Ref Range   Salicylate Lvl <7.0 (L) 7.0 - 30.0 mg/dL  Acetaminophen level  Result Value Ref Range   Acetaminophen (Tylenol), Serum <10 (L) 10 - 30 ug/mL  cbc  Result Value Ref Range   WBC 12.9 (H) 4.0 - 10.5 K/uL   RBC 4.98 4.22 - 5.81 MIL/uL   Hemoglobin 15.0 13.0 - 17.0 g/dL   HCT 16.1 09.6 - 04.5 %   MCV 88.6 80.0 - 100.0 fL   MCH 30.1 26.0 - 34.0 pg   MCHC 34.0 30.0 - 36.0 g/dL   RDW 40.9 81.1 - 91.4 %   Platelets 338 150 - 400 K/uL   nRBC 0.0 0.0 - 0.2 %  Urine Drug Screen, Qualitative  Result Value Ref Range   Tricyclic, Ur Screen NONE DETECTED NONE DETECTED   Amphetamines, Ur Screen NONE DETECTED NONE DETECTED   MDMA (Ecstasy)Ur Screen NONE DETECTED NONE DETECTED   Cocaine Metabolite,Ur [place] NONE DETECTED NONE DETECTED   Opiate, Ur Screen NONE DETECTED NONE DETECTED   Phencyclidine (PCP) Ur S NONE DETECTED NONE DETECTED   Cannabinoid 50 Ng, Ur [place] NONE DETECTED NONE DETECTED   Barbiturates, Ur Screen NONE DETECTED NONE DETECTED   Benzodiazepine, Ur Scrn NONE DETECTED NONE DETECTED   Methadone Scn, Ur NONE DETECTED NONE DETECTED  TSH  Result Value Ref Range   TSH 0.718 0.350 - 4.500 uIU/mL      Assessment & Plan:   Problem List Items Addressed This Visit       Endocrine   Acquired hypothyroidism - Primary    Has been off his meds for 3 months. Was well controlled on his levothryoxine before he stopped it. Will restart and recheck in 6 weeks. Call with any concerns.       Relevant Medications   levothyroxine (SYNTHROID) 200 MCG tablet     Other   Severe recurrent major  depression without psychotic features (HCC)    Not doing well. Likely compounded by his untreated hypothyroidism. Will treat his thyroid. Offered to restart his wellbutrin, he is not interested. Information given regarding [facility]- advised him to reach out to them. Call with any concerns. Recheck 6 weeks.       Other Visit Diagnoses     Does not have health insurance       Referral to see if he qualifies for medicaid and to pharmacy to help with cost of meds sent today.   Relevant Orders   AMB Referral to Pharmacy Medication Management   AMB Referral to Managed Medicaid Care Management        Follow up plan: Return in about 6 weeks (around 10/17/2022).

## 2022-09-05 NOTE — Assessment & Plan Note (Signed)
Not doing well. Likely compounded by his untreated hypothyroidism. Will treat his thyroid. Offered to restart his wellbutrin, he is not interested. Information given regarding [facility]- advised him to reach out to them. Call with any concerns. Recheck 6 weeks.

## 2022-09-05 NOTE — Assessment & Plan Note (Signed)
Has been off his meds for 3 months. Was well controlled on his levothryoxine before he stopped it. Will restart and recheck in 6 weeks. Call with any concerns.

## 2022-09-05 NOTE — Patient Instructions (Signed)
[facility] [address], [place], [place] [address] Phone: [phone]

## 2022-09-11 ENCOUNTER — Telehealth: Payer: Self-pay

## 2022-09-11 NOTE — Progress Notes (Signed)
   Care Guide Note  09/11/2022 Name: Joe Haley MRN: [id number] DOB: [birth year]  Referred by: Larae Grooms, NP Reason for referral : Care Management (Outreach to schedule with pharm d )   Joe Haley is a 32 y.o. year old male who is a primary care patient of Larae Grooms, NP. Joe Haley was referred to the pharmacist for assistance related to HLD.    Successful contact was made with the patient to discuss pharmacy services including being ready for the pharmacist to call at least 5 minutes before the scheduled appointment time, to have medication bottles and any blood sugar or blood pressure readings ready for review. The patient agreed to meet with the pharmacist via with the pharmacist via telephone visit on (date/time).  09/22/2022  Penne Lash, RMA Care Guide [facility]  [place], [place] [address] Direct Dial: [phone] [email or web address]

## 2022-09-14 ENCOUNTER — Other Ambulatory Visit: Payer: Self-pay

## 2022-09-14 NOTE — Patient Outreach (Signed)
  Medicaid Managed Care Social Work Note  09/14/2022 Name:  Joe Haley MRN:  [id number] DOB:  1991/01/01  Joe Haley is an 32 y.o. year old male who is a primary patient of Larae Grooms, NP.  The [facility] Managed Care Coordination team was consulted for assistance with:   Insurance  Joe Haley was given information about Medicaid Managed Care Coordination team services today. Anastasia Pall Patient agreed to services and verbal consent obtained.  Engaged with patient  for by telephone forinitial visit in response to referral for case management and/or care coordination services.   Assessments/Interventions:  Review of past medical history, allergies, medications, health status, including review of consultants reports, laboratory and other test data, was performed as part of comprehensive evaluation and provision of chronic care management services.  SDOH: (Social Determinant of Health) assessments and interventions performed: SDOH Interventions    Flowsheet Row Office Visit from 12/20/2021 in [facility] Office Visit from 12/05/2021 in [facility] Office Visit from 04/12/2021 in [facility] Office Visit from 05/07/2020 in [facility] Office Visit from 12/30/2018 in [facility] Office Visit from 07/26/2017 in [facility]  SDOH Interventions        Depression Interventions/Treatment  Medication, Currently on Treatment Medication Medication, Currently on Treatment Currently on Treatment Currently on Treatment, Medication Currently on Treatment, Medication      BSW completed a telephone outreach with patient. He states he works and is in need of insurance. BSW informed patient he could go to the [facility] DSS to apply for Medicaid or go to the Market[employer]. BSW will email information to patient at [email or web address]. Patient  states no other resources are needed at this time. Advanced Directives Status:  Not addressed in this encounter.  Care Plan                 No Known Allergies  Medications Reviewed Today   Medications were not reviewed in this encounter     Patient Active Problem List   Diagnosis Date Noted   Severe recurrent major depression without psychotic features (HCC) 03/28/2016   Ibuprofen overdose 03/28/2016   GAD (generalized anxiety disorder) 06/24/2014   Acquired hypothyroidism 04/02/2014   Hyperlipidemia 08/12/2013   RLS (restless legs syndrome) 08/11/2013   Acanthosis nigricans 07/02/2013   Morbid obesity (HCC) 07/02/2013    Conditions to be addressed/monitored per PCP order:   insurance  There are no care plans that you recently modified to display for this patient.   Follow up:  Patient agrees to Care Plan and Follow-up.  Plan: The Managed Medicaid care management team will reach out to the patient again over the next 30 days.  Date/time of next scheduled Social Work care management/care coordination outreach:  10/18/22  Joe Haley, Joe Haley, [facility]  Managed [facility] Social Worker [phone]

## 2022-09-14 NOTE — Patient Instructions (Signed)
Visit Information  The Patient                                              was given information about Medicaid Managed Care team care coordination services and consented to engagement with the [facility] Managed Care team.   Social Worker will follow up on 10/18/22.   Abelino Derrick, MHA [facility]  Managed [facility] Social Worker [phone]

## 2022-09-19 ENCOUNTER — Ambulatory Visit (INDEPENDENT_AMBULATORY_CARE_PROVIDER_SITE_OTHER): Payer: Self-pay | Admitting: Nurse Practitioner

## 2022-09-19 ENCOUNTER — Encounter: Payer: Self-pay | Admitting: Nurse Practitioner

## 2022-09-19 VITALS — BP 114/75 | HR 75 | Temp 98.0°F | Wt 381.2 lb

## 2022-09-19 DIAGNOSIS — F332 Major depressive disorder, recurrent severe without psychotic features: Secondary | ICD-10-CM

## 2022-09-19 DIAGNOSIS — R45851 Suicidal ideations: Secondary | ICD-10-CM

## 2022-09-19 DIAGNOSIS — E039 Hypothyroidism, unspecified: Secondary | ICD-10-CM

## 2022-09-19 DIAGNOSIS — F411 Generalized anxiety disorder: Secondary | ICD-10-CM

## 2022-09-19 NOTE — Patient Instructions (Addendum)
AREA MENTAL HEALTH PROVIDERS:  [facility] -- offer same day and walk-in crisis Address: [address] [place], [place] [address] Hours: Opens 8?AM Wed and closes 5 pm Phone: [phone]  Psychologist Elna Breslow, PhD  [address] [place], [place] [address] Phone:[phone] After [phone]  [facility] [address] [place], [place] [address] Phone:[phone] or [phone] Fax:[phone] Services offered at their [facility] office.   [facility] (Pediatrics/Adolescents & Adults) [facility] [address] [place], [place] [address] & [facility] clinic too Phone: [phone]  [facility] [address] [place], [place] [address] Phone:[phone]  [facility] [address] [place], [place] [address]  [phone]  [facility] [address] [place], [place] [address] [phone]  [facility] [address] [place], [place] [address] [phone]  [address] in Mental Healthcare Locations: [place], [place], [place], [place], [place], [place], [place], [place], [place], [place], Therapist, nutritional. Phone:[phone]  Psychiatry Dr. Fayrene Helper, [place]  [phone]  Dr. Maryruth Bun  [address] [facility] [address] [place], [place] [address] Phone:[phone] Fax:[phone]  [facility] [address] [place] [place], [place] [address] Phone:[phone]  Suicidal Feelings: How to Help Yourself Suicide is when you end your own life. Suicidal ideation includes expressing thoughts about, or a preoccupation with, ending your own life. There are many things you can do to help yourself feel better when struggling with these feelings. Many services and people are available to support you and others who struggle  with similar feelings. If you ever feel like you may hurt yourself or others, or have thoughts about taking your own life, get help right away. To get help: Go to your nearest emergency department. Call your local emergency services (911 in the [place].S.). Call the [employer] health and human services helpline (211 in the [place].S.). Call or text a suicide hotline to speak with a trained counselor. The following suicide hotlines are available in the [place]: 1-800-273-TALK (1-843-482-8793 or 988 in the [place].S.). 1-800-SUICIDE 970-852-9142). Text 972-755-1908. This is the [employer] in the U.S. 8676383475. This is a hotline for Spanish speakers. 828-121-3848. This is a hotline for TTY users. 1-866-4-U-TREVOR 270-432-2459). This is a hotline for lesbian, gay, bisexual, transgender, or questioning youth. For a list of hotlines in [place], visit AmenCredit.is.html Contact a crisis center or a local suicide prevention center. To find a crisis center or suicide prevention center: Call your local hospital, clinic, community service organization, mental health center, social service provider, or health department. Ask for help with connecting to a crisis center. For a list of crisis centers in the [place], visit: suicidepreventionlifeline.org For a list of crisis centers in [place], visit: suicideprevention.ca How to help yourself feel better  Promise yourself that you will not do anything bad or extreme when you have suicidal feelings. Remember the times you have felt hopeful. Many people have gotten through suicidal thoughts and feelings, and you can too. If you have had these feelings before, remind yourself that you can get through them again. Let family, friends, teachers, or counselors know how you are feeling. Do not separate yourself from those who care about you and want to help you. Talk with someone every day, even if you do not feel  like talking to anyone or being with other people. Face-to-face conversation is best to help them understand your feelings. Contact a mental health care provider and work with this person  regularly. Make a safety plan that you can follow during a crisis. Include phone numbers of suicide prevention hotlines, mental health professionals, and trusted friends and family members you can call during an emergency. Save these numbers on your phone. If you are thinking of taking a lot of medicine, give your medicine to someone who can give it to you as prescribed. If you are on antidepressants and are concerned you will overdose, tell your health care provider so that he or she can give you safer medicines. Try to stick to your routines and follow a schedule every day. Make self-care a priority. Make a list of realistic goals, and cross them off when you achieve them. Accomplishments can give you a sense of worth. Wait until you are feeling better before doing things that you find difficult or unpleasant. Do things that you have always enjoyed to take your mind off your feelings. Try reading a book, or listening to or playing music. Spending time outside, in nature, may help you feel better. Follow these instructions at home:  Visit your primary health care provider every year for a physical and a mental health checkup. Take over-the-counter and prescription medicines only as told by your health care provider. Ask your health care provider about the possible side effects of any medicines you are taking. Ask your health care provider about whether suicidal ideation is a possible side effect of any of your medicines. Learn about suicidal ideation and what increases the risk for the development of suicidal thoughts. Eat a well-balanced diet, and eat regular meals. Get plenty of rest. Exercise if you are able. Just 30 minutes of exercise each day can help you feel better. Keep your living space well  lit. Do not use alcohol or drugs. Remove these substances from your home. General recommendations Remove weapons, poisons, knives, and other deadly items from your home. Work with a mental health care provider as needed. When you are feeling well, write yourself a letter with tips and support that you can read when you are not feeling well. Remember that life's difficulties can be sorted out with help. Conditions can be treated, and you can learn behaviors and ways of thinking that will help you. Work with your health care provider or counselor to learn ways of coping with your thoughts and feelings. Where to find more information National Suicide Prevention Lifeline: www.suicidepreventionlifeline.org Hopeline: www.hopeline.com [employer] for Suicide Prevention: https://www.ayers.com/ [employer] (for lesbian, gay, bisexual, transgender, or questioning youth): www.thetrevorproject.[employer] of Mental Health: https://ramirez-williams.com/ Suicide Prevention Resources: FarmerBuys.com.au Contact a health care provider if: You feel as though you are a burden to others. You feel agitated, angry, vengeful, or have extreme mood swings. You have withdrawn from family and friends. You are frequently using drugs or alcohol. Get help right away if: You are talking about suicide or wishing to die. You start making plans for how to commit suicide. You feel that you have no reason to live. You start making plans for putting your affairs in order, saying goodbye, or giving your possessions away. You feel guilt, shame, or unbearable pain, and it seems like there is no way out. You are engaging in risky behaviors that could lead to death. If you have any of these thoughts or symptoms, get help right away: Go to your nearest emergency department or crisis center. Call emergency services (911 in the U.S.). Call or text a suicide crisis  helpline. Summary Suicide is when you take your own  life. Suicidal feelings are thoughts about ending your own life. Promise yourself that you will not do anything bad or extreme when you have suicidal feelings. Let family, friends, teachers, or counselors know how you are feeling. Get help right away if you start making plans for how to commit suicide. This information is not intended to replace advice given to you by your health care provider. Make sure you discuss any questions you have with your health care provider. Document Revised: 08/12/2020 Document Reviewed: 05/27/2020 Elsevier Patient Education  2024 [employer].

## 2022-09-19 NOTE — Assessment & Plan Note (Signed)
Chronic, ongoing.  Restarted medication on 09/05/22.  Recommend continue this dosing and will recheck labs at future visit.

## 2022-09-19 NOTE — Assessment & Plan Note (Signed)
With recent attempt 2 days ago, refer to depression plan of care.

## 2022-09-19 NOTE — Assessment & Plan Note (Signed)
Chronic, exacerbated by recent loss of job and being off medications for months.  He had suicide attempt 2 days ago by attempting overdose on Zquil, remains tired per his report.  Continues to endorse SI, is currently living with his parents.  Contacted mobile crisis unit, but then patient reported mother present.  Spoke to his mother and discussed with her at this time do not feel comfortable restarting medication without an ER visit with blood work due to recent attempt and psych evaluation, as concerned he will attempt again and concern about safety when leaves office setting as he is still having SI, although living with parents at present.  She is agreeable to taking him to ER this evening for further evaluation and will take him from office to there.  Plan on follow-up after ER visit and psych evaluation.

## 2022-09-19 NOTE — Progress Notes (Signed)
BP 114/75   Pulse 75   Temp 98 F (36.7 C) (Oral)   Wt (!) 381 lb 3.2 oz (172.9 kg)   SpO2 98%   BMI 53.17 kg/m    Subjective:    Patient ID: Joe Haley, male    DOB: [birth year], 32 y.o.   MRN: [id number]  HPI: Joe Haley is a 32 y.o. male  Chief Complaint  Patient presents with   Anxiety   Depression   ANXIETY/DEPRESSION Was seen 09/05/22 but at that visit declined restarting Wellbutrin.  Was on 300 MG XL dosing in past, but stopped taking 4-5 months ago and reports no benefit from it.  He was given information to attend [facility], but has not gone as of yet.  He did group therapy there in past and did not like it.  Has mother and father that live nearby.  They are aware of his suicidal thoughts and are aware of what happened two days ago.  He reports they are supportive and he lives with them at present due to his depression, was in own apartment, but moved in with them today.   Lost his job at [employer] recently when they shut down, which was major trigger for him.  He has history of 3 suicide attempts in past, last about one year ago.    In past has taken Abilify, Buspar, Prozac, Vistaril, Trazodone, and Ativan. Has hypothyroid and is taking Levothyroxine 200 MCG for this, he restarted on 09/05/22 -- had been off of it for 3 months prior to this. Duration:uncontrolled Anxious mood: yes  Excessive worrying: yes Irritability: no  Sweating: no Nausea: no Palpitations:no Hyperventilation: yes with panic Panic attacks: yes -- last Tuesday had one Agoraphobia: no  Obscessions/compulsions:  food is obsession Depressed mood: yes    10/15/22    4:07 PM 09/05/2022    9:30 AM 12/20/2021    9:18 AM 12/05/2021    4:52 PM 04/12/2021   10:31 AM  Depression screen PHQ 2/9  Decreased Interest 3 2 2 3 1   Down, Depressed, Hopeless 3 2 1 1 1   PHQ - 2 Score 6 4 3 4 2   Altered sleeping 3 3 3 3 1   Tired, decreased energy 3 3 3 3 1   Change in appetite 3 3 3 3 1   Feeling bad or  failure about yourself  3 3 1 1 1   Trouble concentrating 3 3 1 1 1   Moving slowly or fidgety/restless 3 3 2 1 1   Suicidal thoughts 3 2 1 2 1   PHQ-9 Score 27 24 17 18 9   Difficult doing work/chores Extremely dIfficult Very difficult Somewhat difficult Very difficult Somewhat difficult  Anhedonia: yes Weight changes: no Insomnia: sleeping too much Hypersomnia: yes Fatigue/loss of energy: yes Feelings of worthlessness: yes Feelings of guilt: yes Impaired concentration/indecisiveness: yes Suicidal ideations: yes over past week or two, did have attempt 2 days ago by taking a bunch of ZQuil, still feels tired from this and out of sorts per his report Crying spells: yes Recent Stressors/Life Changes: yes   Relationship problems: no   Family stress: yes  -- brother is a stressor, is an alcoholic -- does not live with him, but he comes around a lot   Financial stress: yes    Job stress: yes - works at [employer]   Recent death/loss: no     10/15/2022    4:07 PM 09/05/2022    9:30 AM 12/20/2021    9:18 AM  12/05/2021    4:52 PM  GAD 7 : Generalized Anxiety Score  Nervous, Anxious, on Edge 3 2 1 1   Control/stop worrying 3 2 1 1   Worry too much - different things 3 2 1 1   Trouble relaxing 3 3 2 1   Restless 0 0 1 1  Easily annoyed or irritable 0 0 3 3  Afraid - awful might happen 3 3 1 1   Total GAD 7 Score 15 12 10 9   Anxiety Difficulty Extremely difficult Very difficult Somewhat difficult Very difficult   Relevant past medical, surgical, family and social history reviewed and updated as indicated. Interim medical history since our last visit reviewed. Allergies and medications reviewed and updated.  Review of Systems  Constitutional:  Negative for activity change, diaphoresis, fatigue and fever.  Respiratory:  Negative for cough, chest tightness, shortness of breath and wheezing.   Cardiovascular:  Negative for chest pain, palpitations and leg swelling.  Gastrointestinal: Negative.    Endocrine: Negative for cold intolerance and heat intolerance.  Neurological: Negative.   Psychiatric/Behavioral:  Positive for decreased concentration, sleep disturbance and suicidal ideas (attempt 2 days ago). The patient is nervous/anxious.     Per HPI unless specifically indicated above     Objective:    BP 114/75   Pulse 75   Temp 98 F (36.7 C) (Oral)   Wt (!) 381 lb 3.2 oz (172.9 kg)   SpO2 98%   BMI 53.17 kg/m   Wt Readings from Last 3 Encounters:  09/19/22 (!) 381 lb 3.2 oz (172.9 kg)  09/05/22 (!) 388 lb 6.4 oz (176.2 kg)  12/20/21 (!) 364 lb (165.1 kg)    Physical Exam Vitals and nursing note reviewed.  Constitutional:      General: He is awake. He is not in acute distress.    Appearance: He is well-developed and well-groomed. He is obese. He is not ill-appearing or toxic-appearing.  HENT:     Head: Normocephalic.     Right Ear: Hearing and external ear normal.     Left Ear: Hearing and external ear normal.  Eyes:     General: Lids are normal.     Extraocular Movements: Extraocular movements intact.     Conjunctiva/sclera: Conjunctivae normal.  Neck:     Thyroid: No thyromegaly.     Vascular: No carotid bruit.  Cardiovascular:     Rate and Rhythm: Normal rate and regular rhythm.     Heart sounds: Normal heart sounds. No murmur heard.    No gallop.  Pulmonary:     Effort: Pulmonary effort is normal. No accessory muscle usage or respiratory distress.     Breath sounds: Normal breath sounds.  Abdominal:     General: Bowel sounds are normal. There is no distension.     Palpations: Abdomen is soft.     Tenderness: There is no abdominal tenderness.  Musculoskeletal:     Cervical back: Full passive range of motion without pain.     Right lower leg: No edema.     Left lower leg: No edema.  Skin:    General: Skin is warm.     Capillary Refill: Capillary refill takes less than 2 seconds.  Neurological:     Mental Status: He is alert and oriented to  person, place, and time.     Deep Tendon Reflexes: Reflexes are normal and symmetric.     Reflex Scores:      Brachioradialis reflexes are 2+ on the right side and 2+ on  the left side.      Patellar reflexes are 2+ on the right side and 2+ on the left side. Psychiatric:        Attention and Perception: Attention normal.        Mood and Affect: Affect is flat.        Speech: Speech normal.        Behavior: Behavior is slowed and withdrawn. Behavior is cooperative.        Thought Content: Thought content includes suicidal (had attempt 2 days ago and does endorse SI) ideation.     Comments: Very flat affect, not maintaining eye contact with provider.  Slower speech and lethargic in appearance.    Results for orders placed or performed during the hospital encounter of 10/31/21  SARS Coronavirus 2 by RT PCR (hospital order, performed in [facility] hospital lab) *cepheid single result test* Anterior Nasal Swab   Specimen: Anterior Nasal Swab  Result Value Ref Range   SARS Coronavirus 2 by RT PCR NEGATIVE NEGATIVE  Comprehensive metabolic panel  Result Value Ref Range   Sodium 139 135 - 145 mmol/L   Potassium 4.0 3.5 - 5.1 mmol/L   Chloride 106 98 - 111 mmol/L   CO2 24 22 - 32 mmol/L   Glucose, Bld 106 (H) 70 - 99 mg/dL   BUN 14 6 - 20 mg/dL   Creatinine, Ser 1.61 0.61 - 1.24 mg/dL   Calcium 9.0 8.9 - 09.6 mg/dL   Total Protein 7.5 6.5 - 8.1 g/dL   Albumin 4.3 3.5 - 5.0 g/dL   AST 21 15 - 41 U/L   ALT 21 0 - 44 U/L   Alkaline Phosphatase 65 38 - 126 U/L   Total Bilirubin 0.9 0.3 - 1.2 mg/dL   GFR, Estimated >04 >54 mL/min   Anion gap 9 5 - 15  Ethanol  Result Value Ref Range   Alcohol, Ethyl (B) <10 <10 mg/dL  Salicylate level  Result Value Ref Range   Salicylate Lvl <7.0 (L) 7.0 - 30.0 mg/dL  Acetaminophen level  Result Value Ref Range   Acetaminophen (Tylenol), Serum <10 (L) 10 - 30 ug/mL  cbc  Result Value Ref Range   WBC 12.9 (H) 4.0 - 10.5 K/uL   RBC 4.98 4.22 - 5.81  MIL/uL   Hemoglobin 15.0 13.0 - 17.0 g/dL   HCT 09.8 11.9 - 14.7 %   MCV 88.6 80.0 - 100.0 fL   MCH 30.1 26.0 - 34.0 pg   MCHC 34.0 30.0 - 36.0 g/dL   RDW 82.9 56.2 - 13.0 %   Platelets 338 150 - 400 K/uL   nRBC 0.0 0.0 - 0.2 %  Urine Drug Screen, Qualitative  Result Value Ref Range   Tricyclic, Ur Screen NONE DETECTED NONE DETECTED   Amphetamines, Ur Screen NONE DETECTED NONE DETECTED   MDMA (Ecstasy)Ur Screen NONE DETECTED NONE DETECTED   Cocaine Metabolite,Ur [place] NONE DETECTED NONE DETECTED   Opiate, Ur Screen NONE DETECTED NONE DETECTED   Phencyclidine (PCP) Ur S NONE DETECTED NONE DETECTED   Cannabinoid 50 Ng, Ur [place] NONE DETECTED NONE DETECTED   Barbiturates, Ur Screen NONE DETECTED NONE DETECTED   Benzodiazepine, Ur Scrn NONE DETECTED NONE DETECTED   Methadone Scn, Ur NONE DETECTED NONE DETECTED  TSH  Result Value Ref Range   TSH 0.718 0.350 - 4.500 uIU/mL      Assessment & Plan:   Problem List Items Addressed This Visit       Endocrine  Acquired hypothyroidism    Chronic, ongoing.  Restarted medication on 09/05/22.  Recommend continue this dosing and will recheck labs at future visit.        Other   GAD (generalized anxiety disorder)    Chronic, exacerbated by recent loss of job and being off medications for months.  He had suicide attempt 2 days ago by attempting overdose on Zquil, remains tired per his report.  Continues to endorse SI, is currently living with his parents.  Contacted mobile crisis unit, but then patient reported mother present.  Spoke to his mother and discussed with her at this time do not feel comfortable restarting medication without an ER visit with blood work due to recent attempt and psych evaluation, as concerned he will attempt again and concern about safety when leaves office setting as he is still having SI, although living with parents at present.  She is agreeable to taking him to ER this evening for further evaluation and will take him from  office to there.  Plan on follow-up after ER visit and psych evaluation.        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  He had suicide attempt 2 days ago by attempting overdose on Zquil, remains tired per his report.  Continues to endorse SI, is currently living with his parents.  Contacted mobile crisis unit, but then patient reported mother present.  Spoke to his mother and discussed with her at this time do not feel comfortable restarting medication without an ER visit with blood work due to recent attempt and psych evaluation, as concerned he will attempt again and concern about safety when leaves office setting as he is still having SI, although living with parents at present.  She is agreeable to taking him to ER this evening for further evaluation and will take him from office to there.  Plan on follow-up after ER visit and psych evaluation.        Suicidal ideations    With recent attempt 2 days ago, refer to depression plan of care.       Time: 25 minutes, >50% spent counseling/or care coordination   Follow up plan: Return for to ER, follow-up after this.

## 2022-09-22 ENCOUNTER — Other Ambulatory Visit: Payer: Self-pay

## 2022-09-22 NOTE — Progress Notes (Signed)
   09/22/2022  Patient ID: Joe Haley, male   DOB: Feb 04, 1990, 32 y.o.   MRN: [id number]  Outreach attempt for scheduled telephone visit unsuccessful, but I left a HIPAA compliant voicemail with my direct phone number.  It appears patient is currently receiving IP behavioral health treatment.  I will check chart in 2 weeks to determine if appropriate to call to reschedule telephone visit (if patient home).  Lenna Gilford, PharmD, DPLA

## 2022-09-26 ENCOUNTER — Telehealth: Payer: Self-pay

## 2022-09-26 NOTE — Progress Notes (Unsigned)
   Care Guide Note  09/26/2022 Name: Joe Haley MRN: [id number] DOB: 15-Aug-1990  Referred by: Joe Grooms, NP Reason for referral : Care Coordination (Outreach to reschedule with pharm d )   Joe Haley is a 32 y.o. year old male who is a primary care patient of Joe Grooms, NP. Joe Haley was referred to the pharmacist for assistance related to HLD.    An unsuccessful telephone outreach was attempted today to contact the patient who was referred to the pharmacy team for assistance with medication management. Additional attempts will be made to contact the patient.   Joe Haley, RMA Care Guide [facility]  [place], [place] [address] Direct Dial: [phone] [email or web address]

## 2022-09-27 ENCOUNTER — Telehealth: Payer: Self-pay

## 2022-09-27 NOTE — Transitions of Care (Post Inpatient/ED Visit) (Signed)
   09/27/2022  Name: Joe Haley MRN: [id number] DOB: 1990-07-19  Today's TOC FU Call Status: Today's TOC FU Call Status:: Successful TOC FU Call Completed TOC FU Call Complete Date: 09/27/22 Patient's Name and Date of Birth confirmed.  Transition Care Management Follow-up Telephone Call Date of Discharge: 09/26/22 Discharge Facility: Other [facility]) Name of Other ([facility]) Discharge Facility: [facility] Type of Discharge: Inpatient Admission Primary Inpatient Discharge Diagnosis:: suicidal ideation How have you been since you were released from the hospital?: Better Any questions or concerns?: No  Items Reviewed: Did you receive and understand the discharge instructions provided?: Yes Medications obtained,verified, and reconciled?: Yes (Medications Reviewed) Any new allergies since your discharge?: No Dietary orders reviewed?: Yes Do you have support at home?: No  Medications Reviewed Today: Medications Reviewed Today     Reviewed by Karena Addison, LPN (Licensed Practical Nurse) on 09/27/22 at 1017  Med List Status: <None>   Medication Order Taking? Sig Documenting Provider Last Dose Status Informant  buPROPion (WELLBUTRIN XL) 300 MG 24 hr tablet [id number] No TAKE 1 TABLET BY MOUTH ONCE DAILY PLEASE  SCHEDULE  A  FOLLOW  UP  APPOINTMENT  Patient not taking: Reported on 09/05/2022   Larae Grooms, NP Not Taking Active   levothyroxine (SYNTHROID) 200 MCG tablet [id number] No Take 1 tablet (200 mcg total) by mouth daily. Dorcas Carrow, DO Taking Active             Home Care and Equipment/Supplies: Were Home Health Services Ordered?: NA Any new equipment or medical supplies ordered?: NA  Functional Questionnaire: Do you need assistance with bathing/showering or dressing?: No Do you need assistance with meal preparation?: No Do you need assistance with eating?: No Do you have difficulty maintaining continence: No Do you need assistance with getting  out of bed/getting out of a chair/moving?: No Do you have difficulty managing or taking your medications?: No  Follow up appointments reviewed: PCP Follow-up appointment confirmed?: No (no avail appts, sent message to staff to schedule) MD Provider Line [phone] Given: No Specialist Hospital Follow-up appointment confirmed?: NA Do you need transportation to your follow-up appointment?: No Do you understand care options if your condition(s) worsen?: Yes-patient verbalized understanding    SIGNATURE Karena Addison, LPN [facility] Nurse Health Advisor Direct Dial [phone]

## 2022-10-03 ENCOUNTER — Encounter: Payer: Self-pay | Admitting: Physician Assistant

## 2022-10-03 ENCOUNTER — Ambulatory Visit (INDEPENDENT_AMBULATORY_CARE_PROVIDER_SITE_OTHER): Payer: MEDICAID | Admitting: Physician Assistant

## 2022-10-03 VITALS — BP 111/77 | HR 82 | Ht 71.0 in | Wt 375.8 lb

## 2022-10-03 DIAGNOSIS — F332 Major depressive disorder, recurrent severe without psychotic features: Secondary | ICD-10-CM | POA: Diagnosis not present

## 2022-10-03 DIAGNOSIS — Z09 Encounter for follow-up examination after completed treatment for conditions other than malignant neoplasm: Secondary | ICD-10-CM | POA: Diagnosis not present

## 2022-10-03 MED ORDER — TRAZODONE HCL 50 MG PO TABS
50.0000 mg | ORAL_TABLET | Freq: Every day | ORAL | 0 refills | Status: DC
Start: 2022-10-17 — End: 2023-05-03

## 2022-10-03 MED ORDER — ESCITALOPRAM OXALATE 10 MG PO TABS
10.0000 mg | ORAL_TABLET | Freq: Every day | ORAL | 0 refills | Status: AC
Start: 2022-10-17 — End: 2022-11-16

## 2022-10-03 NOTE — Progress Notes (Signed)
Acute Office Visit   Patient: Joe Haley   DOB: 1990/08/26   32 y.o. Male  MRN: [id number] Visit Date: 10/03/2022  Today's healthcare provider: Oswaldo Conroy Malerie Eakins, PA-C  Introduced myself to the patient as a Secondary school teacher and provided education on APPs in clinical practice.    Chief Complaint  Patient presents with   Hospitalization Follow-up   Suicidal    Patient is here for follow up on Suicidal Ideation. Patient went to hospital on 09/23/22 and was admitted and discharge 09/26/22. Patient says he is doing better since hospitalization.    Subjective    HPI HPI     Suicidal    Additional comments: Patient is here for follow up on Suicidal Ideation. Patient went to hospital on 09/23/22 and was admitted and discharge 09/26/22. Patient says he is doing better since hospitalization.       Last edited by Malen Gauze, CMA on 10/03/2022  9:08 AM.       Patient reports he is feeling a little bit better  He reports intermittent SI thoughts - not as often as before he was in the hospital  He denies active thoughts or plans to hurt himself at this time  He states he was given a list of resources on discharge but no apts were made for him in regards to Psychiatry or therapy services  Unsure of quantity of his medications on dc   He denies active thoughts of suicide and denies having a plan    Mood: better than previously  Sleep: He reports it is "alright" thinks he is sleeping more than usual   Transition of Care Hospital Follow up.   Hospital[facility]Facility[facility]  D/C Physician: Tamsen Snider, PMHNP   D/C Date: 09/26/22  Records Requested:  Records Received:  Records Reviewed: Hospital admission, discharge summary, ED visit summary from 09/21/22  Diagnoses on Discharge:  Principal Problem: Severe recurrent major depression without psychotic features (CMS-HCC) Active Problems: Acquired hypothyroidism GAD (generalized anxiety disorder)    Date of interactive Contact within 48 hours of discharge: 09/27/22 Contact was through: phone  Date of 7 day or 14 day face-to-face visit:    within 7 days  Outpatient Encounter Medications as of 10/03/2022  Medication Sig   levothyroxine (SYNTHROID) 200 MCG tablet Take 1 tablet (200 mcg total) by mouth daily.   [DISCONTINUED] escitalopram (LEXAPRO) 10 MG tablet Take 1 tablet by mouth daily.   [DISCONTINUED] traZODone (DESYREL) 50 MG tablet Take by mouth.   [START ON 10/17/2022] escitalopram (LEXAPRO) 10 MG tablet Take 1 tablet (10 mg total) by mouth daily.   [START ON 10/17/2022] traZODone (DESYREL) 50 MG tablet Take 1 tablet (50 mg total) by mouth at bedtime.   [DISCONTINUED] buPROPion (WELLBUTRIN XL) 300 MG 24 hr tablet TAKE 1 TABLET BY MOUTH ONCE DAILY PLEASE  SCHEDULE  A  FOLLOW  UP  APPOINTMENT (Patient not taking: Reported on 09/05/2022)   No facility-administered encounter medications on file as of 10/03/2022.    Diagnostic Tests Reviewed/Disposition: reviewed lab results from ED visit on 09/21/22 along with PCP office labs   Consults: recreational therapy,   Discharge Instructions Discharge instructions  1. Please continue taking medications as prescribed.   2. Please refrain from using illicit substances or alcohol, as these can affect your mood and could cause anxiety or other concerning symptoms.  3. As a condition of discharge, you agree to follow-up with appropriate outpatient  psychiatric providers. You have been provided prescriptions for a limited supply of your medications. All refills will need to be handled by your outpatient provider.  4. Seek further medical care for any increase in symptoms or new symptoms, such as thoughts of wanting to hurt yourself, hurt others, or if you have increased agitation.  ---For immediate concerns, call 911 for emergency medical attention.  5. Do not make changes to your medications, including taking more or less than prescribed, unless  under the supervision of your physician. Be aware that some medications may make you feel worse if abruptly stopped.   6. Please follow up with your primary care provider(s) as soon as possible after discharge.  You have been provided a limited supply of your mental health medications for you to take until you can fill your prescriptions for your mental health medications - escitalopram and trazodone    Disease/illness Education: patient appears to have received extensive education and discussion regarding depressed mood, his feelings, suicidal thoughts, and coping mechanisms while hospitalized   Home Health/Community Services Discussions/Referrals: Referrals for Psychiatry and Psychology/therapy services placed today   Establishment or re-establishment of referral orders for community resources:Referrals for Psychiatry and Psychology/therapy services placed today   Discussion with other health care providers:  Assessment and Support of treatment regimen adherence: Patient reports he is taking his medications as directed. We reviewed that it can take some time for the Lexapro to kick in so he should be mindful of his mood and contact us with concerns   Appointments Coordinated with:   [facility] [address] [place], [place] [address] [phone] Encouraged to follow up within 7 days  [facility] [address] [place], [place] [address] [phone] Walk-in hrs are: Mon-Fri; 8am-4pm Encouraged to follow up within 7 days   Education for self-management, independent living, and ADLs: Patient was provided with extensive list of resources, suicide and crisis hotline numbers along with counselor information by county         10/03/2022    9:49 AM 09/19/2022    4:07 PM 09/05/2022    9:30 AM 12/20/2021    9:18 AM 12/05/2021    4:52 PM  Depression screen PHQ 2/9  Decreased Interest 3 3 2 2 3   Down, Depressed,  Hopeless 2 3 2 1 1   PHQ - 2 Score 5 6 4 3 4   Altered sleeping 1 3 3 3 3   Tired, decreased energy 2 3 3 3 3   Change in appetite 2 3 3 3 3   Feeling bad or failure about yourself  2 3 3 1 1   Trouble concentrating 1 3 3 1 1   Moving slowly or fidgety/restless 1 3 3 2 1   Suicidal thoughts 1 3 2 1 2   PHQ-9 Score 15 27 24 17 18   Difficult doing work/chores Extremely dIfficult Extremely dIfficult Very difficult Somewhat difficult Very difficult       10/03/2022    9:50 AM 09/19/2022    4:07 PM 09/05/2022    9:30 AM 12/20/2021    9:18 AM  GAD 7 : Generalized Anxiety Score  Nervous, Anxious, on Edge 2 3 2 1   Control/stop worrying 2 3 2 1   Worry too much - different things 2 3 2 1   Trouble relaxing 2 3 3 2   Restless 1 0 0 1  Easily annoyed or irritable 1 0 0 3  Afraid - awful might happen 2 3 3  1  Total GAD 7 Score 12 15 12 10   Anxiety Difficulty Somewhat difficult Extremely difficult Very difficult Somewhat difficult     Medications: Outpatient Medications Prior to Visit  Medication Sig   levothyroxine (SYNTHROID) 200 MCG tablet Take 1 tablet (200 mcg total) by mouth daily.   [DISCONTINUED] escitalopram (LEXAPRO) 10 MG tablet Take 1 tablet by mouth daily.   [DISCONTINUED] traZODone (DESYREL) 50 MG tablet Take by mouth.   [DISCONTINUED] buPROPion (WELLBUTRIN XL) 300 MG 24 hr tablet TAKE 1 TABLET BY MOUTH ONCE DAILY PLEASE  SCHEDULE  A  FOLLOW  UP  APPOINTMENT (Patient not taking: Reported on 09/05/2022)   No facility-administered medications prior to visit.    Review of Systems  Psychiatric/Behavioral:  Positive for dysphoric mood and suicidal ideas.         Objective    BP 111/77   Pulse 82   Ht 5\' 11"  (1.803 m)   Wt (!) 375 lb 12.8 oz (170.5 kg)   BMI 52.41 kg/m     Physical Exam Vitals reviewed.  Constitutional:      General: He is awake.     Appearance: Normal appearance. He is well-developed and well-groomed.  HENT:     Head: Normocephalic and atraumatic.   Pulmonary:     Effort: Pulmonary effort is normal.  Musculoskeletal:     Cervical back: Normal range of motion.  Neurological:     General: No focal deficit present.     Mental Status: He is alert.     GCS: GCS eye subscore is 4. GCS verbal subscore is 5. GCS motor subscore is 6.     Gait: Gait is intact.  Psychiatric:        Attention and Perception: Attention normal.        Mood and Affect: Affect is flat.        Speech: Speech normal.        Behavior: Behavior normal. Behavior is cooperative.        Thought Content: Thought content normal.       No results found for any visits on 10/03/22.  Assessment & Plan      Return in about 4 weeks (around 10/31/2022) for Depression.       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    Chronic, ongoing Patient was recently hospitalized at [facility] under involuntary hold due to suicidal ideation and attempt noted at his last apt at PCP office He was dc on 09/26/22 and started on Lexapro 10 mg pO every day and Trazodone 50 mg pO every day  We reviewed his PHQ9 and GAD7 results together which are markedly improved from previous results He states he is having transient, passive SI but no longer has a plan or active thoughts He reports taking his medication as directed and is aware that it can take several weeks for them to reach full effect Will place urgent referrals to Psychiatry and therapy services today given recent suicide attempt Reviewed his crisis plan- he knows the numbers to crisis lines and lives with his parents who routinely check on him  Recommend follow up in 4 weeks to assess response to regimen or sooner if concerns arise.        Relevant Medications   escitalopram (LEXAPRO) 10 MG tablet (Start on 10/17/2022)   traZODone (DESYREL) 50 MG tablet (Start on 10/17/2022)  Other Relevant Orders   Ambulatory referral  to Psychiatry   Ambulatory referral to Psychology   Other Visit Diagnoses     Hospital discharge follow-up            Return in about 4 weeks (around 10/31/2022) for Depression.   I, Rodell Marrs E Mckinleigh Schuchart, PA-C, have reviewed all documentation for this visit. The documentation on 10/03/22 for the exam, diagnosis, procedures, and orders are all accurate and complete.   Jacquelin Hawking, MHS, PA-C [facility]

## 2022-10-03 NOTE — Assessment & Plan Note (Signed)
Chronic, ongoing Patient was recently hospitalized at [facility] under involuntary hold due to suicidal ideation and attempt noted at his last apt at PCP office He was dc on 09/26/22 and started on Lexapro 10 mg pO every day and Trazodone 50 mg pO every day  We reviewed his PHQ9 and GAD7 results together which are markedly improved from previous results He states he is having transient, passive SI but no longer has a plan or active thoughts He reports taking his medication as directed and is aware that it can take several weeks for them to reach full effect Will place urgent referrals to Psychiatry and therapy services today given recent suicide attempt Reviewed his crisis plan- he knows the numbers to crisis lines and lives with his parents who routinely check on him  Recommend follow up in 4 weeks to assess response to regimen or sooner if concerns arise.

## 2022-10-06 NOTE — Progress Notes (Unsigned)
   Care Guide Note  10/06/2022 Name: Joe Haley MRN: [id number] DOB: [birth year]  Referred by: Larae Grooms, NP Reason for referral : Care Coordination (Outreach to reschedule with pharm d )   Joe Haley is a 32 y.o. year old male who is a primary care patient of Larae Grooms, NP. Joe Haley was referred to the pharmacist for assistance related to HLD.    A second unsuccessful telephone outreach was attempted today to contact the patient who was referred to the pharmacy team for assistance with medication assistance. Additional attempts will be made to contact the patient.  Penne Lash, RMA Care Guide [facility]  [place], [place] [address] Direct Dial: [phone] [email or web address]

## 2022-10-11 NOTE — Progress Notes (Signed)
   Care Guide Note  10/11/2022 Name: Armaan Richel MRN: [id number] DOB: September 11, 1990  Referred by: Larae Grooms, NP Reason for referral : Care Coordination (Outreach to reschedule with pharm d )   Danan Surprenant is a 31 y.o. year old male who is a primary care patient of Larae Grooms, NP. Ozzy Reyaansh Ro was referred to the pharmacist for assistance related to DM.    A third unsuccessful telephone outreach was attempted today to contact the patient who was referred to the pharmacy team for assistance with medication management. The Population Health team is pleased to engage with this patient at any time in the future upon receipt of referral and should he/she be interested in assistance from the [facility] team.   Penne Lash, RMA Care Guide [facility]  [place], [place] [address] Direct Dial: [phone] [email or web address]

## 2022-10-18 ENCOUNTER — Other Ambulatory Visit: Payer: Self-pay

## 2022-10-18 NOTE — Patient Instructions (Signed)
Tailored Plan Medicaid On July 1, some people on [place] Medicaid will move to a new kind of Medicaid health plan called a Tailored Plan. Tailored Plans cover your doctor visits, prescription drugs, and health care services.    If your [place] Medicaid will move to a Tailored Plan, you should have gotten a letter and welcome packet. If you're not sure, call your [place] Medicaid Enrollment Broker at [phone] and ask.  Check out these free materials, in [place] and [place], to learn more about your Tailored Plan: [email or web address]/Tailored-Plans/Toolkit  Tailored Care Management Services  TCM services are available to you now. If you are a Tailored Plan member or will be and want information about Tailored Care Management Services including rides to appointments and community and home services, call the Care Management provider for your county of residence:    [facility] ([place], [place])  Member Services: [phone] Behavioral Health Crisis Line: 207-231-8192, [place], [place], [place], [place])  Member Services: [phone] Behavioral Health Crisis Line: 585-727-8380  [facility] [place] [facility]) Member Services: [phone] Behavioral Health Crisis Line: [phone]

## 2022-10-18 NOTE — Patient Outreach (Signed)
Medicaid Managed Care Social Work Note  10/18/2022 Name:  Joe Haley MRN:  [id number] DOB:  1990-05-27  Joe Haley is an 32 y.o. year old male who is a primary patient of Joe Grooms, NP.  The [facility] Managed Care Coordination team was consulted for assistance with:  Community Resources   Joe Haley was given information about Medicaid Managed Care Coordination team services today. Joe Haley Patient agreed to services and verbal consent obtained.  Engaged with patient  for by telephone forfollow up visit in response to referral for case management and/or care coordination services.   Assessments/Interventions:  Review of past medical history, allergies, medications, health status, including review of consultants reports, laboratory and other test data, was performed as part of comprehensive evaluation and provision of chronic care management services.  SDOH: (Social Determinant of Health) assessments and interventions performed: SDOH Interventions    Flowsheet Row Office Visit from 10/03/2022 in [facility] Office Visit from 09/19/2022 in [facility] Office Visit from 12/20/2021 in [facility] Office Visit from 12/05/2021 in [facility] Office Visit from 04/12/2021 in [facility] Office Visit from 05/07/2020 in [facility]  SDOH Interventions        Depression Interventions/Treatment  Referral to Psychiatry, Currently on Treatment, Counseling Medication Medication, Currently on Treatment Medication Medication, Currently on Treatment Currently on Treatment      BSW completed a telephone outreach with patinet, he states he did receive the resources BSW emailed and he was able to apply for Medicaid and now has [place]. No additional resources are needed at this time.  Advanced Directives Status:  Not addressed in this  encounter.  Care Plan                 No Known Allergies  Medications Reviewed Today   Medications were not reviewed in this encounter     Patient Active Problem List   Diagnosis Date Noted   Suicidal ideations 09/19/2022   Severe recurrent major depression without psychotic features (HCC) 03/28/2016   Ibuprofen overdose 03/28/2016   GAD (generalized anxiety disorder) 06/24/2014   Acquired hypothyroidism 04/02/2014   Hyperlipidemia 08/12/2013   RLS (restless legs syndrome) 08/11/2013   Acanthosis nigricans 07/02/2013   Morbid obesity (HCC) 07/02/2013    Conditions to be addressed/monitored per PCP order:   community resources  There are no care plans that you recently modified to display for this patient.   Follow up:  Patient agrees to Care Plan and Follow-up.  Plan: The  Patient has been provided with contact information for the Managed Medicaid care management team and has been advised to call with any health related questions or concerns.     Joe Haley, MHA [facility]  Managed [facility] Social Worker [phone]

## 2022-10-19 ENCOUNTER — Ambulatory Visit (INDEPENDENT_AMBULATORY_CARE_PROVIDER_SITE_OTHER): Payer: MEDICAID | Admitting: Family Medicine

## 2022-10-19 ENCOUNTER — Encounter: Payer: Self-pay | Admitting: Family Medicine

## 2022-10-19 VITALS — BP 104/70 | HR 80 | Ht 71.0 in | Wt 379.8 lb

## 2022-10-19 DIAGNOSIS — F332 Major depressive disorder, recurrent severe without psychotic features: Secondary | ICD-10-CM

## 2022-10-19 DIAGNOSIS — E039 Hypothyroidism, unspecified: Secondary | ICD-10-CM

## 2022-10-19 NOTE — Assessment & Plan Note (Signed)
Rechecking labs today. Await results. Treat as needed.

## 2022-10-19 NOTE — Patient Instructions (Signed)
[facility] is going to call you today in about 30 minutes from [phone] to schedule your psychiatry appointment.

## 2022-10-19 NOTE — Progress Notes (Signed)
BP 104/70   Pulse 80   Ht 5\' 11"  (1.803 m)   Wt (!) 379 lb 12.8 oz (172.3 kg)   SpO2 96%   BMI 52.97 kg/m    Subjective:    Patient ID: Joe Haley, male    DOB: 1990/12/25, 32 y.o.   MRN: [id number]  HPI: Joe Haley is a 32 y.o. male  Chief Complaint  Patient presents with   Hypothyroidism   Depression   HYPOTHYROIDISM Thyroid control status:better Satisfied with current treatment? unsure Medication side effects: no Medication compliance: good compliance Recent dose adjustment:yes Fatigue: no Cold intolerance: no Heat intolerance: no Weight gain: no Weight loss: no Constipation: no Diarrhea/loose stools: no Palpitations: no Lower extremity edema: no Anxiety/depressed mood: yes  DEPRESSION Mood status: stable Satisfied with current treatment?: no Symptom severity: severe  Duration of current treatment :  couple of weeks Side effects: no Medication compliance: good compliance Psychotherapy/counseling: no  Previous psychiatric medications: lexapro, wellbutrin Depressed mood: yes Anxious mood: yes Anhedonia: yes Significant weight loss or gain: no Insomnia: no  Fatigue: yes Feelings of worthlessness or guilt: yes Impaired concentration/indecisiveness: yes Suicidal ideations: yes Hopelessness: yes Crying spells: no    10/19/2022   10:22 AM 10/03/2022    9:49 AM 09/19/2022    4:07 PM 09/05/2022    9:30 AM 12/20/2021    9:18 AM  Depression screen PHQ 2/9  Decreased Interest 2 3 3 2 2   Down, Depressed, Hopeless 2 2 3 2 1   PHQ - 2 Score 4 5 6 4 3   Altered sleeping 2 1 3 3 3   Tired, decreased energy 2 2 3 3 3   Change in appetite 2 2 3 3 3   Feeling bad or failure about yourself  3 2 3 3 1   Trouble concentrating 2 1 3 3 1   Moving slowly or fidgety/restless 2 1 3 3 2   Suicidal thoughts 1 1 3 2 1   PHQ-9 Score 18 15 27 24 17   Difficult doing work/chores Extremely dIfficult Extremely dIfficult Extremely dIfficult Very difficult Somewhat  difficult     Relevant past medical, surgical, family and social history reviewed and updated as indicated. Interim medical history since our last visit reviewed. Allergies and medications reviewed and updated.  Review of Systems  Constitutional: Negative.   Respiratory: Negative.    Cardiovascular: Negative.   Gastrointestinal: Negative.   Musculoskeletal: Negative.   Neurological: Negative.   Psychiatric/Behavioral: Negative.      Per HPI unless specifically indicated above     Objective:    BP 104/70   Pulse 80   Ht 5\' 11"  (1.803 m)   Wt (!) 379 lb 12.8 oz (172.3 kg)   SpO2 96%   BMI 52.97 kg/m   Wt Readings from Last 3 Encounters:  10/19/22 (!) 379 lb 12.8 oz (172.3 kg)  10/03/22 (!) 375 lb 12.8 oz (170.5 kg)  09/19/22 (!) 381 lb 3.2 oz (172.9 kg)    Physical Exam Vitals and nursing note reviewed.  Constitutional:      General: He is not in acute distress.    Appearance: Normal appearance. He is obese. He is not ill-appearing, toxic-appearing or diaphoretic.  HENT:     Head: Normocephalic and atraumatic.     Right Ear: External ear normal.     Left Ear: External ear normal.     Nose: Nose normal.     Mouth/Throat:     Mouth: Mucous membranes are moist.     Pharynx: Oropharynx  is clear.  Eyes:     General: No scleral icterus.       Right eye: No discharge.        Left eye: No discharge.     Extraocular Movements: Extraocular movements intact.     Conjunctiva/sclera: Conjunctivae normal.     Pupils: Pupils are equal, round, and reactive to light.  Cardiovascular:     Rate and Rhythm: Normal rate and regular rhythm.     Pulses: Normal pulses.     Heart sounds: Normal heart sounds. No murmur heard.    No friction rub. No gallop.  Pulmonary:     Effort: Pulmonary effort is normal. No respiratory distress.     Breath sounds: Normal breath sounds. No stridor. No wheezing, rhonchi or rales.  Chest:     Chest wall: No tenderness.  Musculoskeletal:         General: Normal range of motion.     Cervical back: Normal range of motion and neck supple.  Skin:    General: Skin is warm and dry.     Capillary Refill: Capillary refill takes less than 2 seconds.     Coloration: Skin is not jaundiced or pale.     Findings: No bruising, erythema, lesion or rash.  Neurological:     General: No focal deficit present.     Mental Status: He is alert and oriented to person, place, and time. Mental status is at baseline.  Psychiatric:        Mood and Affect: Mood normal. Affect is flat.        Behavior: Behavior normal.        Thought Content: Thought content normal.        Judgment: Judgment normal.     Results for orders placed or performed during the hospital encounter of 10/31/21  SARS Coronavirus 2 by RT PCR (hospital order, performed in [facility] hospital lab) *cepheid single result test* Anterior Nasal Swab   Specimen: Anterior Nasal Swab  Result Value Ref Range   SARS Coronavirus 2 by RT PCR NEGATIVE NEGATIVE  Comprehensive metabolic panel  Result Value Ref Range   Sodium 139 135 - 145 mmol/L   Potassium 4.0 3.5 - 5.1 mmol/L   Chloride 106 98 - 111 mmol/L   CO2 24 22 - 32 mmol/L   Glucose, Bld 106 (H) 70 - 99 mg/dL   BUN 14 6 - 20 mg/dL   Creatinine, Ser 0.45 0.61 - 1.24 mg/dL   Calcium 9.0 8.9 - 40.9 mg/dL   Total Protein 7.5 6.5 - 8.1 g/dL   Albumin 4.3 3.5 - 5.0 g/dL   AST 21 15 - 41 U/L   ALT 21 0 - 44 U/L   Alkaline Phosphatase 65 38 - 126 U/L   Total Bilirubin 0.9 0.3 - 1.2 mg/dL   GFR, Estimated >81 >19 mL/min   Anion gap 9 5 - 15  Ethanol  Result Value Ref Range   Alcohol, Ethyl (B) <10 <10 mg/dL  Salicylate level  Result Value Ref Range   Salicylate Lvl <7.0 (L) 7.0 - 30.0 mg/dL  Acetaminophen level  Result Value Ref Range   Acetaminophen (Tylenol), Serum <10 (L) 10 - 30 ug/mL  cbc  Result Value Ref Range   WBC 12.9 (H) 4.0 - 10.5 K/uL   RBC 4.98 4.22 - 5.81 MIL/uL   Hemoglobin 15.0 13.0 - 17.0 g/dL   HCT 14.7  82.9 - 56.2 %   MCV 88.6 80.0 - 100.0 fL  MCH 30.1 26.0 - 34.0 pg   MCHC 34.0 30.0 - 36.0 g/dL   RDW 95.6 21.3 - 08.6 %   Platelets 338 150 - 400 K/uL   nRBC 0.0 0.0 - 0.2 %  Urine Drug Screen, Qualitative  Result Value Ref Range   Tricyclic, Ur Screen NONE DETECTED NONE DETECTED   Amphetamines, Ur Screen NONE DETECTED NONE DETECTED   MDMA (Ecstasy)Ur Screen NONE DETECTED NONE DETECTED   Cocaine Metabolite,Ur [place] NONE DETECTED NONE DETECTED   Opiate, Ur Screen NONE DETECTED NONE DETECTED   Phencyclidine (PCP) Ur S NONE DETECTED NONE DETECTED   Cannabinoid 50 Ng, Ur [place] NONE DETECTED NONE DETECTED   Barbiturates, Ur Screen NONE DETECTED NONE DETECTED   Benzodiazepine, Ur Scrn NONE DETECTED NONE DETECTED   Methadone Scn, Ur NONE DETECTED NONE DETECTED  TSH  Result Value Ref Range   TSH 0.718 0.350 - 4.500 uIU/mL      Assessment & Plan:   Problem List Items Addressed This Visit       Endocrine   Acquired hypothyroidism - Primary    Rechecking labs today. Await results. Treat as needed.       Relevant Orders   TSH     Other   Severe recurrent major depression without psychotic features (HCC)    Has not been in contact with psychiatry yet- they have called him several times. They will call him in 30 minutes and he was given their phone call. Check back in in 2 weeks if not established with them at this time.         Follow up plan: Return in about 2 weeks (around 11/02/2022).

## 2022-10-19 NOTE — Assessment & Plan Note (Signed)
Has not been in contact with psychiatry yet- they have called him several times. They will call him in 30 minutes and he was given their phone call. Check back in in 2 weeks if not established with them at this time.

## 2022-10-20 LAB — TSH: TSH: 2.78 u[IU]/mL (ref 0.450–4.500)

## 2022-10-21 ENCOUNTER — Other Ambulatory Visit: Payer: Self-pay | Admitting: Family Medicine

## 2022-10-21 MED ORDER — LEVOTHYROXINE SODIUM 200 MCG PO TABS: 200.0000 ug | ORAL_TABLET | Freq: Every day | ORAL | 0 refills | Status: DC

## 2022-10-23 ENCOUNTER — Other Ambulatory Visit: Payer: MEDICAID

## 2022-10-23 NOTE — Progress Notes (Signed)
10/23/2022  Patient ID: Joe Haley, male   DOB: 1990/11/26, 32 y.o.   MRN: [id number]  Outreach attempt for scheduled telephone visit to assist with access/affordability of medication was unsuccessful.  Patient initially referred to clinical pharmacist by PCP due to not having insurance, but it appears he is now covered by [facility] [employer] plan.  Upon fill review, patient has recently picked up currently prescribed medications (escitalopram 10mg , levothyroxine , trazodone 50mg ).  Patient sees PCP again 9/27- forwarding note and informing I am happy to follow-up if needed.  Lenna Gilford, PharmD, DPLA

## 2022-10-23 NOTE — Progress Notes (Signed)
10/23/2022  Patient ID: Joe Haley, male   DOB: 1990/12/30, 32 y.o.   MRN: [id number]  S/O Patient returning my call from earlier today to address medication access/adherence in response to referral from Dr. Laural Benes  Medication Access/Adherence -Medication list reviewed for accuracy and adherence -Patient previously did not have insurance coverage, which was a barrier to adherence -He is now part of tailored medicaid plan and endorses affordability and good adherence to his medication regimen -There are also no barriers in regard to transportation -Patient inquired about common side effects of escitalopram and expected benefit of [place] -He does endorse increased drowsiness with escitalopram; he is currently taking this medication in the morning  A/P  Medication Access/Adherence -Current medication strategy is sufficient to maintain adherence to pharmacotherapy  -Patient endorses being on escitalopram 4-6 weeks now and does state there is still room for improvement in regard to mood.  He states he feels as if he is "just coasting."  Could consider increasing escitalopram to 20mg  daily at 9/27 follow-up visit with PCP.  H does not endorse any negative side effects or worsening of symptoms at this time. -Recommended that patient start taking escitalopram at bedtime to prevent daytime drowsiness  Follow-up:  None scheduled at this time but can as recommended per PCP  Lenna Gilford, PharmD, DPLA

## 2022-10-27 ENCOUNTER — Ambulatory Visit: Payer: Self-pay | Admitting: Physician Assistant

## 2022-11-02 ENCOUNTER — Ambulatory Visit (INDEPENDENT_AMBULATORY_CARE_PROVIDER_SITE_OTHER): Payer: MEDICAID | Admitting: Physician Assistant

## 2022-11-02 ENCOUNTER — Encounter: Payer: Self-pay | Admitting: Physician Assistant

## 2022-11-02 VITALS — BP 124/78 | HR 67 | Temp 97.7°F | Wt 387.8 lb

## 2022-11-02 DIAGNOSIS — F332 Major depressive disorder, recurrent severe without psychotic features: Secondary | ICD-10-CM | POA: Diagnosis not present

## 2022-11-02 DIAGNOSIS — F411 Generalized anxiety disorder: Secondary | ICD-10-CM

## 2022-11-02 MED ORDER — ESCITALOPRAM OXALATE 10 MG PO TABS
20.0000 mg | ORAL_TABLET | Freq: Every day | ORAL | 1 refills | Status: DC
Start: 2022-11-02 — End: 2023-01-08

## 2022-11-02 NOTE — Progress Notes (Signed)
Acute Office Visit   Patient: Joe Haley   DOB: 05-25-1990   32 y.o. Male  MRN: [id number] Visit Date: 11/02/2022  Today's healthcare provider: Oswaldo Conroy Jayra Choyce, PA-C  Introduced myself to the patient as a Secondary school teacher and provided education on APPs in clinical practice.    Chief Complaint  Patient presents with   Depression   Subjective    HPI    Depression   DEPRESSION  Mood status: stable He states he feels like he is "just coasting" He did make contact with pharmacy recommends increasing lexapro  He states he called psychiatry services back and they told him they do not accept his insurance ([facility])   Satisfied with current treatment?: no Symptom severity: severe  Duration of current treatment : several weeks  Side effects: no Medication compliance: good compliance Psychotherapy/counseling: no  Previous psychiatric medications: lexapro  Depressed mood: yes Anxious mood: yes Anhedonia: yes Significant weight loss or gain: no Insomnia:  It's been pretty bad the past couple days -   hard to fall asleep Fatigue: yes Feelings of worthlessness or guilt: yes Impaired concentration/indecisiveness: yes Suicidal ideations: yes- reports thoughts that he would rather not be here but denies plans  He reports he still has safety plan and will reach out to mother for more severe thoughts  Hopelessness: yes Crying spells: no        11/02/2022    9:16 AM 10/19/2022   10:22 AM 10/03/2022    9:49 AM 09/19/2022    4:07 PM 09/05/2022    9:30 AM  Depression screen PHQ 2/9  Decreased Interest 2 2 3 3 2   Down, Depressed, Hopeless 2 2 2 3 2   PHQ - 2 Score 4 4 5 6 4   Altered sleeping 2 2 1 3 3   Tired, decreased energy 2 2 2 3 3   Change in appetite 2 2 2 3 3   Feeling bad or failure about yourself  2 3 2 3 3   Trouble concentrating 2 2 1 3 3   Moving slowly or fidgety/restless 1 2 1 3 3   Suicidal thoughts 2 1 1 3 2   PHQ-9 Score 17 18 15 27 24   Difficult doing  work/chores Extremely dIfficult Extremely dIfficult Extremely dIfficult Extremely dIfficult Very difficult      11/02/2022    9:17 AM 10/19/2022   10:22 AM 10/03/2022    9:50 AM 09/19/2022    4:07 PM  GAD 7 : Generalized Anxiety Score  Nervous, Anxious, on Edge 2 2 2 3   Control/stop worrying 2 1 2 3   Worry too much - different things 2 2 2 3   Trouble relaxing 2 2 2 3   Restless 1 1 1  0  Easily annoyed or irritable 0 1 1 0  Afraid - awful might happen 2 2 2 3   Total GAD 7 Score 11 11 12 15   Anxiety Difficulty Extremely difficult Extremely difficult Somewhat difficult Extremely difficult     Medications: Outpatient Medications Prior to Visit  Medication Sig   levothyroxine (SYNTHROID) 200 MCG tablet Take 1 tablet (200 mcg total) by mouth daily.   traZODone (DESYREL) 50 MG tablet Take 1 tablet (50 mg total) by mouth at bedtime.   [DISCONTINUED] escitalopram (LEXAPRO) 10 MG tablet Take 1 tablet (10 mg total) by mouth daily.   No facility-administered medications prior to visit.    Review of Systems  Psychiatric/Behavioral:  Positive for decreased concentration, dysphoric mood, sleep disturbance and suicidal  ideas. The patient is nervous/anxious.         Objective    BP 124/78   Pulse 67   Temp 97.7 F (36.5 C) (Oral)   Wt (!) 387 lb 12.8 oz (175.9 kg)   SpO2 97%   BMI 54.09 kg/m     Physical Exam Vitals reviewed.  Constitutional:      General: He is awake.     Appearance: Normal appearance. He is well-developed and well-groomed.  HENT:     Head: Normocephalic and atraumatic.  Pulmonary:     Effort: Pulmonary effort is normal.  Musculoskeletal:     Cervical back: Normal range of motion.  Neurological:     Mental Status: He is alert.  Psychiatric:        Attention and Perception: Attention and perception normal.        Mood and Affect: Mood is depressed. Affect is flat.        Speech: Speech normal.        Behavior: Behavior is withdrawn. Behavior is  cooperative.        Cognition and Memory: Cognition normal.       No results found for any visits on 11/02/22.  Assessment & Plan      Return in about 2 weeks (around 11/16/2022) for Depression, anxiety, recheck thyroid .         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    Chronic, ongoing, severe Minimal to no improvement since previous visit  Was not able to establish with [facility] due to insurance coverage  Will increase Lexapro to 20 mg PO every day for now to assist with mood He reports persistent fatigue- unsure if this is secondary to mood vs insomnia  May need to add Rexulti or Vraylar to augment lexapro and assist with mood improvement Another urgent referral was placed today for Psychiatry services  Recommend follow up in 2 weeks for monitoring or sooner if concerns arise        Relevant Medications   escitalopram (LEXAPRO) 10 MG tablet   Other Relevant Orders   Ambulatory referral to Psychiatry   GAD (generalized anxiety disorder)   Relevant Medications   escitalopram (LEXAPRO) 10 MG tablet     Return in about 2 weeks (around 11/16/2022) for Depression, anxiety, recheck thyroid .   I, Kostas Marrow E Florencio Hollibaugh, PA-C, have reviewed all documentation for this visit. The documentation on 11/02/22 for the exam, diagnosis, procedures, and orders are all accurate and complete.   Jacquelin Hawking, MHS, PA-C [facility]

## 2022-11-02 NOTE — Assessment & Plan Note (Addendum)
Chronic, ongoing, severe Minimal to no improvement since previous visit  Was not able to establish with [facility] due to insurance coverage  Will increase Lexapro to 20 mg PO every day for now to assist with mood He reports persistent fatigue- unsure if this is secondary to mood vs insomnia  May need to add Rexulti or Vraylar to augment lexapro and assist with mood improvement Another urgent referral was placed today for Psychiatry services  Recommend follow up in 2 weeks for monitoring or sooner if concerns arise

## 2022-11-06 ENCOUNTER — Telehealth: Payer: Self-pay | Admitting: Nurse Practitioner

## 2022-11-06 NOTE — Telephone Encounter (Unsigned)
Copied from CRM [id number]. Topic: General - Other >> Nov 06, 2022 10:10 AM Everette C wrote: Reason for CRM: Medication Refill - Medication: escitalopram (LEXAPRO) 10 MG tablet [[id number]]  The patient has been directed by their pharmacy to request a new prescription with clarified directions expressly stating the instructions   Has the patient contacted their pharmacy? Yes.   (Agent: If no, request that the patient contact the pharmacy for the refill. If patient does not wish to contact the pharmacy document the reason why and proceed with request.) (Agent: If yes, when and what did the pharmacy advise?)  Preferred Pharmacy (with phone number or street name): [facility] [address], [place] - [address] [place] [address] Phone: [phone] Fax: [phone] Hours: Not open 24 hours   Has the patient been seen for an appointment in the last year OR does the patient have an upcoming appointment? Yes.    Agent: Please be advised that RX refills may take up to 3 business days. We ask that you follow-up with your pharmacy.

## 2022-11-06 NOTE — Telephone Encounter (Signed)
Most recent script states to take 2 tablets per day to equal 20 mg. Not sure what needs to be clarified

## 2022-11-06 NOTE — Telephone Encounter (Unsigned)
Copied from CRM [id number]. Topic: Referral - Question >> Nov 06, 2022 10:13 AM Everette C wrote: Reason for CRM: The patient has called to follow up on a previous request for a referral for medication management   Please contact the patient further when possible

## 2022-11-16 ENCOUNTER — Encounter: Payer: Self-pay | Admitting: Nurse Practitioner

## 2022-11-23 ENCOUNTER — Encounter: Payer: Self-pay | Admitting: Nurse Practitioner

## 2022-11-23 ENCOUNTER — Ambulatory Visit: Payer: MEDICAID | Admitting: Physician Assistant

## 2022-11-23 ENCOUNTER — Ambulatory Visit: Payer: MEDICAID | Admitting: Nurse Practitioner

## 2022-11-23 VITALS — BP 109/71 | HR 80 | Temp 98.8°F | Ht 71.0 in | Wt 381.8 lb

## 2022-11-23 DIAGNOSIS — F332 Major depressive disorder, recurrent severe without psychotic features: Secondary | ICD-10-CM

## 2022-11-23 DIAGNOSIS — R051 Acute cough: Secondary | ICD-10-CM

## 2022-11-23 DIAGNOSIS — F411 Generalized anxiety disorder: Secondary | ICD-10-CM | POA: Diagnosis not present

## 2022-11-23 MED ORDER — BENZONATATE 100 MG PO CAPS: 100.0000 mg | ORAL_CAPSULE | Freq: Two times a day (BID) | ORAL | 0 refills | Status: DC | PRN

## 2022-11-23 MED ORDER — METHYLPREDNISOLONE 4 MG PO TBPK: ORAL_TABLET | ORAL | 0 refills | Status: DC

## 2022-11-23 MED ORDER — AZITHROMYCIN 250 MG PO TABS: ORAL_TABLET | ORAL | 0 refills | Status: AC

## 2022-11-23 MED ORDER — ARIPIPRAZOLE 2 MG PO TABS: 2.0000 mg | ORAL_TABLET | Freq: Every day | ORAL | 0 refills | Status: DC

## 2022-11-23 NOTE — Assessment & Plan Note (Signed)
Chronic, ongoing, severe Minimal to no improvement since previous visit - despite increasing Lexapro to 20mg  Was not able to establish with [facility] due to insurance coverage  Will add Abilify 2 mg PO every day for now to assist with mood.  He has been on this before but not sure why he stopped it. If not improved may need to add Rexulti or Vraylar to augment lexapro and assist with mood improvement Another urgent referral was placed today for Psychiatry services- spoke with referral coordinator who is working to get patient in for an appt. Recommend follow up in 6 weeks for monitoring or sooner if concerns arise

## 2022-11-23 NOTE — Progress Notes (Signed)
BP 109/71   Pulse 80   Temp 98.8 F (37.1 C) (Oral)   Ht 5\' 11"  (1.803 m)   Wt (!) 381 lb 12.8 oz (173.2 kg)   SpO2 98%   BMI 53.25 kg/m    Subjective:    Patient ID: Joe Haley, male    DOB: 01/03/1991, 32 y.o.   MRN: [id number]  HPI: Joe Haley is a 32 y.o. male  Chief Complaint  Patient presents with   Anxiety   Depression   Hypothyroidism   URI    Patient state he has been having a cough, congestion, drainage, and headache for the last 5 days    DEPRESSION   Mood status: stable He states he feels like he is "just coasting" He did make contact with pharmacy recommends increasing lexapro - has hasn't seen much of a difference since increasing the dose.   Patient states the Wellbutrin didn't help either.   He stopped Abilify but he isn't sure why he stopped it.  He states he called psychiatry services back and they told him they do not accept his insurance [employer])- has not heard from new referral.   Satisfied with current treatment?: no Symptom severity: severe  Duration of current treatment : several weeks  Side effects: no Medication compliance: good compliance Psychotherapy/counseling: no  Previous psychiatric medications: lexapro  Depressed mood: yes Anxious mood: yes Anhedonia: yes Significant weight loss or gain: no Insomnia:  It's been pretty bad the past couple days -   hard to fall asleep Fatigue: yes Feelings of worthlessness or guilt: yes Impaired concentration/indecisiveness: yes Suicidal ideations: yes- reports thoughts that he would rather not be here but denies plans  He reports he still has safety plan and will reach out to mother for more severe thoughts  Hopelessness: yes Crying spells: no             11/02/2022    9:16 AM 10/19/2022   10:22 AM 10/03/2022    9:49 AM 09/19/2022    4:07 PM 09/05/2022    9:30 AM  Depression screen PHQ 2/9  Decreased Interest 2 2 3 3 2   Down, Depressed, Hopeless 2 2 2 3 2   PHQ -  2 Score 4 4 5 6 4   Altered sleeping 2 2 1 3 3   Tired, decreased energy 2 2 2 3 3   Change in appetite 2 2 2 3 3   Feeling bad or failure about yourself  2 3 2 3 3   Trouble concentrating 2 2 1 3 3   Moving slowly or fidgety/restless 1 2 1 3 3   Suicidal thoughts 2 1 1 3 2   PHQ-9 Score 17 18 15 27 24   Difficult doing work/chores Extremely dIfficult Extremely dIfficult Extremely dIfficult Extremely dIfficult Very difficult        11/02/2022    9:17 AM 10/19/2022   10:22 AM 10/03/2022    9:50 AM 09/19/2022    4:07 PM  GAD 7 : Generalized Anxiety Score  Nervous, Anxious, on Edge 2 2 2 3   Control/stop worrying 2 1 2 3   Worry too much - different things 2 2 2 3   Trouble relaxing 2 2 2 3   Restless 1 1 1  0  Easily annoyed or irritable 0 1 1 0  Afraid - awful might happen 2 2 2 3   Total GAD 7 Score 11 11 12 15   Anxiety Difficulty Extremely difficult Extremely difficult Somewhat difficult Extremely difficult    UPPER RESPIRATORY TRACT INFECTION  Worst symptom: symptoms started 5 days ago Fever: no Cough: yes Shortness of breath: yes Wheezing: yes Chest pain: yes, with cough Chest tightness: yes Chest congestion: yes Nasal congestion: yes Runny nose: no Post nasal drip: no Sneezing: no Sore throat: no Swollen glands: no Sinus pressure: no Headache: yes Face pain: no Toothache: no Ear pain: no bilateral Ear pressure: no bilateral Eyes red/itching:no Eye drainage/crusting: no  Vomiting: no Rash: no Fatigue: yes Sick contacts: no Strep contacts: no  Context: worse Recurrent sinusitis: no Relief with OTC cold/cough medications: no  Treatments attempted: cough syrup      Relevant past medical, surgical, family and social history reviewed and updated as indicated. Interim medical history since our last visit reviewed. Allergies and medications reviewed and updated.  Review of Systems  Constitutional:  Positive for fatigue. Negative for fever.  HENT:  Positive for congestion.  Negative for ear pain, postnasal drip, rhinorrhea, sinus pressure, sinus pain, sneezing and sore throat.   Respiratory:  Positive for cough, chest tightness, shortness of breath and wheezing.   Cardiovascular:  Positive for chest pain.  Gastrointestinal:  Negative for vomiting.  Skin:  Negative for rash.  Neurological:  Positive for headaches.  Psychiatric/Behavioral:  Positive for dysphoric mood. The patient is nervous/anxious.     Per HPI unless specifically indicated above     Objective:    BP 109/71   Pulse 80   Temp 98.8 F (37.1 C) (Oral)   Ht 5\' 11"  (1.803 m)   Wt (!) 381 lb 12.8 oz (173.2 kg)   SpO2 98%   BMI 53.25 kg/m   Wt Readings from Last 3 Encounters:  11/23/22 (!) 381 lb 12.8 oz (173.2 kg)  11/02/22 (!) 387 lb 12.8 oz (175.9 kg)  10/19/22 (!) 379 lb 12.8 oz (172.3 kg)    Physical Exam Vitals and nursing note reviewed.  Constitutional:      General: He is not in acute distress.    Appearance: Normal appearance. He is obese. He is not ill-appearing, toxic-appearing or diaphoretic.  HENT:     Head: Normocephalic.     Right Ear: External ear normal.     Left Ear: External ear normal.     Nose: Congestion and rhinorrhea present.     Mouth/Throat:     Mouth: Mucous membranes are moist.     Pharynx: Posterior oropharyngeal erythema present. No oropharyngeal exudate.  Eyes:     General:        Right eye: No discharge.        Left eye: No discharge.     Extraocular Movements: Extraocular movements intact.     Conjunctiva/sclera: Conjunctivae normal.     Pupils: Pupils are equal, round, and reactive to light.  Cardiovascular:     Rate and Rhythm: Normal rate and regular rhythm.     Heart sounds: No murmur heard. Pulmonary:     Effort: Pulmonary effort is normal. No respiratory distress.     Breath sounds: Normal breath sounds. No wheezing, rhonchi or rales.  Abdominal:     General: Abdomen is flat. Bowel sounds are normal.  Musculoskeletal:     Cervical  back: Normal range of motion and neck supple.  Skin:    General: Skin is warm and dry.     Capillary Refill: Capillary refill takes less than 2 seconds.  Neurological:     General: No focal deficit present.     Mental Status: He is alert and oriented to person, place, and time.  Psychiatric:        Mood and Affect: Mood normal.        Behavior: Behavior normal.        Thought Content: Thought content normal.        Judgment: Judgment normal.     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  He has been on this before but not sure why he stopped it. If not improved may need to add Rexulti or Vraylar to augment lexapro and assist with mood improvement Another urgent referral was placed today for Psychiatry services- spoke with referral coordinator who is working to get patient in for an appt. Recommend follow up in 6 weeks for monitoring or sooner if concerns arise       GAD (generalized anxiety disorder)    Chronic, ongoing, severe Minimal to no improvement since previous visit - despite increasing Lexapro to 20mg  Was not able to establish with [facility] due to insurance coverage  Will add Abilify 2 mg PO every day for now to assist with mood.  He has been on this before but not sure why he stopped it. If not improved may need to add Rexulti or Vraylar to augment lexapro and assist with mood improvement Another urgent referral was placed today for Psychiatry services- spoke with referral coordinator who is working  to get patient in for an appt. Recommend follow up in 6 weeks for monitoring or sooner if concerns arise       Morbid obesity (HCC)    Recommended eating smaller high protein, low fat meals more frequently and exercising 30 mins a day 5 times a week with a goal of 10-15lb weight loss in the next 3 months.       Other Visit Diagnoses     Acute cough       Will treat with medrol dose pak and zpak.  Complete course of medication.  Follow up if not improved.        Follow up plan: Return in about 6 weeks (around 01/04/2023) for Depression/Anxiety FU.

## 2022-11-23 NOTE — Assessment & Plan Note (Signed)
Recommended eating smaller high protein, low fat meals more frequently and exercising 30 mins a day 5 times a week with a goal of 10-15lb weight loss in the next 3 months.  

## 2023-01-08 ENCOUNTER — Ambulatory Visit (INDEPENDENT_AMBULATORY_CARE_PROVIDER_SITE_OTHER): Payer: MEDICAID | Admitting: Psychiatry

## 2023-01-08 ENCOUNTER — Encounter: Payer: Self-pay | Admitting: Psychiatry

## 2023-01-08 VITALS — BP 129/85 | HR 89 | Temp 95.1°F | Ht 71.0 in | Wt >= 6400 oz

## 2023-01-08 DIAGNOSIS — Z634 Disappearance and death of family member: Secondary | ICD-10-CM

## 2023-01-08 DIAGNOSIS — R635 Abnormal weight gain: Secondary | ICD-10-CM | POA: Diagnosis not present

## 2023-01-08 DIAGNOSIS — Z79899 Other long term (current) drug therapy: Secondary | ICD-10-CM

## 2023-01-08 DIAGNOSIS — Z9189 Other specified personal risk factors, not elsewhere classified: Secondary | ICD-10-CM | POA: Insufficient documentation

## 2023-01-08 DIAGNOSIS — F333 Major depressive disorder, recurrent, severe with psychotic symptoms: Secondary | ICD-10-CM | POA: Diagnosis not present

## 2023-01-08 DIAGNOSIS — F411 Generalized anxiety disorder: Secondary | ICD-10-CM

## 2023-01-08 DIAGNOSIS — T50905A Adverse effect of unspecified drugs, medicaments and biological substances, initial encounter: Secondary | ICD-10-CM

## 2023-01-08 MED ORDER — METFORMIN HCL ER (OSM) 500 MG PO TB24: 500.0000 mg | ORAL_TABLET | Freq: Every day | ORAL | 1 refills | Status: DC

## 2023-01-08 MED ORDER — ARIPIPRAZOLE 5 MG PO TABS: 5.0000 mg | ORAL_TABLET | Freq: Every morning | ORAL | 1 refills | Status: DC

## 2023-01-08 MED ORDER — ESCITALOPRAM OXALATE 20 MG PO TABS: 20.0000 mg | ORAL_TABLET | Freq: Every day | ORAL | 1 refills | Status: DC

## 2023-01-08 NOTE — Progress Notes (Unsigned)
Psychiatric Initial Adult Assessment   Patient Identification: Joe Haley MRN:  [id number] Date of Evaluation:  01/08/2023 Referral Source: Larae Grooms NP Chief Complaint:   Chief Complaint  Patient presents with   Establish Care   Depression   Anxiety   Medication Refill   Visit Diagnosis:    ICD-10-CM   1. Severe episode of recurrent major depressive disorder, with psychotic features (HCC)  F33.3 Prolactin    escitalopram (LEXAPRO) 20 MG tablet    ARIPiprazole (ABILIFY) 5 MG tablet    2. GAD (generalized anxiety disorder)  F41.1 Prolactin    escitalopram (LEXAPRO) 20 MG tablet    3. Bereavement  Z63.4     4. Weight gain due to medication  R63.5    T50.905A    antipsychotic induced weight gain    5. High risk medication use  Z79.899 Lipid panel    Prolactin    Hemoglobin A1C    Hepatic function panel    Creatinine, serum    CBC with Differential/Platelet    metformin (FORTAMET) 500 MG (OSM) 24 hr tablet    6. At risk for prolonged QT interval syndrome  Z91.89 EKG 12-Lead      History of Present Illness:  Joe Haley is a 32 year old Caucasian male, unemployed, lives in [place] with his parents, has a history of hypothyroidism, morbid obesity, hyperlipidemia, MDD, GAD, presented to establish care.  The patient, a 32 year old with a history of depression, generalized anxiety disorder, presents with worsening mood symptoms/depression/anxiety. The patient has been off his prescribed Lexapro and Abilify for two weeks and reports increased irritability since discontinuation. He also reports a lack of motivation, feelings of hopelessness, and irregular sleep patterns.  Patient reports excessive sleep during the day.  He does not have a good sleep hygiene.    The patient's depressive symptoms have been exacerbated by the recent sudden death of his brother in a motorcycle accident. He reports feelings of grief and has been eating excessively, particularly  snack foods, in response to stress. The patient acknowledges an eating problem and has expressed a desire to lose weight.  The patient has a history of auditory hallucinations, hearing a woman screaming approximately once a week for the past six to seven years.   He also reports social anxiety, often worrying about what others think of him and avoiding public places. The patient has a history of being bullied in high school, which still affects his self-perception and social interactions.  Patient denies any suicidality, homicidality or perceptual disturbances.  Patient however reports passive thoughts of not wanting to be here most recently few days ago although he acknowledges that he has not had any intent to harm himself.  The patient has been unemployed since July of the current year and expresses a desire to return to work but feels hindered by his current physical condition and weight. He has been trying to engage in social activities, such as playing a card game with others, as a means of distraction.  The patient has a history of treatment with Wellbutrin and Abilify, but reports minimal improvement with these medications. He has recently restarted Abilify and believes Lexapro was helping to manage his symptoms. The patient is currently on levothyroxine for hypothyroidism and has been inconsistent with its use.    Associated Signs/Symptoms: Depression Symptoms:  depressed mood, anhedonia, insomnia, fatigue, difficulty concentrating, anxiety, panic attacks, loss of energy/fatigue, (Hypo) Manic Symptoms:   Denies Anxiety Symptoms:  Excessive Worry, Panic Symptoms, Psychotic Symptoms:  Hallucinations: Auditory PTSD Symptoms: Had a traumatic exposure:  yes as noted above Re-experiencing:  Flashbacks Intrusive Thoughts Nightmares Hypervigilance:  Yes Hyperarousal:  Emotional Numbness/Detachment Sleep Avoidance:  Decreased Interest/Participation  Past Psychiatric History:  Patient previously was under the care of a psychiatrist does not remember the name.  Patient reports he had to stop seeing the psychiatrist due to not having the right insurance since he no longer could be under his parent's insurance. Patient with recent inpatient behavioral health admission-[facility] in August 2024-suicidality. Patient reports 2 suicide attempts-overdose on pain pills at the age of 78, overdose on Seroquel-08/2022.  Previous Psychotropic Medications: Yes multiple medication trials in the past-Lexapro, Abilify  Substance Abuse History in the last 12 months:  No.  Consequences of Substance Abuse: Negative  Past Medical History:  Past Medical History:  Diagnosis Date   Acanthosis nigricans    Hyperlipidemia    Hypothyroidism    Ibuprofen overdose    Major depressive disorder    Obesity    Restless leg syndrome    History reviewed. No pertinent surgical history.  Family Psychiatric History: As noted below.  Family History:  Family History  Problem Relation Age of Onset   Depression Sister    Depression Maternal Grandmother    Diabetes Maternal Grandmother     Social History:   Social History   Socioeconomic History   Marital status: Single    Spouse name: Not on file   Number of children: Not on file   Years of education: Not on file   Highest education level: High school graduate  Occupational History   Not on file  Tobacco Use   Smoking status: Never   Smokeless tobacco: Never  Vaping Use   Vaping status: Never Used  Substance and Sexual Activity   Alcohol use: No   Drug use: Yes    Types: Marijuana    Comment: occasional   Sexual activity: Not Currently  Other Topics Concern   Not on file  Social History Narrative   Not on file   Social Determinants of Health   Financial Resource Strain: Low Risk  (09/25/2022)   Received from [facility]   Overall Financial Resource Strain (CARDIA)    Difficulty of Paying  Living Expenses: Not very hard  Food Insecurity: No Food Insecurity (09/25/2022)   Received from [facility]   Hunger Vital Sign    Worried About Running Out of Food in the Last Year: Never true    Ran Out of Food in the Last Year: Never true  Transportation Needs: No Transportation Needs (09/14/2022)   PRAPARE - Administrator, Civil Service (Medical): No    Lack of Transportation (Non-Medical): No  Physical Activity: Insufficiently Active (09/25/2022)   Received from [facility]   Exercise Vital Sign    Days of Exercise per Week: 2 days    Minutes of Exercise per Session: 20 min  Stress: Stress Concern Present (09/25/2022)   Received from [facility] - Occupational Stress Questionnaire    Feeling of Stress : Very much  Social Connections: Socially Isolated (09/25/2022)   Received from [facility]   Social Connection and Isolation Panel [NHANES]    Frequency of Communication with Friends and Family: Once a week    Frequency of Social Gatherings with Friends and Family: Once a week    Attends Religious Services: 1 to 4 times per year  Active Member of Clubs or Organizations: No    Attends Banker Meetings: Never    Marital Status: Never married    Additional Social History: Patient reports he was raised by both parents.  He graduated high school.  Did some college or did not complete.  He has 1 sister living.  His brother passed away recently he was a few years younger than him.  Patient reports he is single.  He denies having children.  He currently lives in [place] with his parents.  He is currently unemployed although he did multiple jobs in the past.  Patient does have a friend who is supportive.  Reports he is spiritual and not religious.  He denies legal issues.  Denies access to guns.  Allergies:  No Known Allergies  Metabolic Disorder Labs: Lab Results  Component Value Date   HGBA1C 4.8  07/26/2017   MPG 88 03/29/2016   Lab Results  Component Value Date   PROLACTIN 77.0 (H) 03/29/2016   Lab Results  Component Value Date   CHOL 137 04/12/2021   TRIG 142 04/12/2021   HDL 46 04/12/2021   CHOLHDL 3.0 04/12/2021   VLDL 36 03/29/2016   LDLCALC 66 04/12/2021   LDLCALC 89 05/07/2020   Lab Results  Component Value Date   TSH 2.780 10/19/2022    Therapeutic Level Labs: No results found for: "LITHIUM" No results found for: "CBMZ" No results found for: "VALPROATE"  Current Medications: Current Outpatient Medications  Medication Sig Dispense Refill   ARIPiprazole (ABILIFY) 5 MG tablet Take 1 tablet (5 mg total) by mouth in the morning. 30 tablet 1   escitalopram (LEXAPRO) 20 MG tablet Take 1 tablet (20 mg total) by mouth daily. 30 tablet 1   levothyroxine (SYNTHROID) 200 MCG tablet Take 1 tablet (200 mcg total) by mouth daily. 90 tablet 0   metformin (FORTAMET) 500 MG (OSM) 24 hr tablet Take 1 tablet (500 mg total) by mouth daily with breakfast. 30 tablet 1   methylPREDNISolone (MEDROL DOSEPAK) 4 MG TBPK tablet Take as directed (Patient not taking: Reported on 01/08/2023) 21 tablet 0   traZODone (DESYREL) 50 MG tablet Take 1 tablet (50 mg total) by mouth at bedtime. 30 tablet 0   No current facility-administered medications for this visit.    Musculoskeletal: Strength & Muscle Tone: within normal limits Gait & Station: normal Patient leans: N/A  Psychiatric Specialty Exam: Review of Systems  Psychiatric/Behavioral:  Positive for decreased concentration, dysphoric mood and sleep disturbance. The patient is nervous/anxious.     Blood pressure 129/85, pulse 89, temperature (!) 95.1 F (35.1 C), temperature source Skin, height 5\' 11"  (1.803 m), weight (!) 419 lb 3.2 oz (190.1 kg).Body mass index is 58.47 kg/m.  General Appearance: Casual  Eye Contact:  Fair  Speech:  Clear and Coherent  Volume:  Normal  Mood:  Anxious, Depressed, and Dysphoric  Affect:   Depressed  Thought Process:  Goal Directed and Descriptions of Associations: Intact  Orientation:  Full (Time, Place, and Person)  Thought Content:  Logical  Suicidal Thoughts:  No  Homicidal Thoughts:  No  Memory:  Immediate;   Fair Recent;   Fair Remote;   Fair  Judgement:  Fair  Insight:  Fair  Psychomotor Activity:  Normal  Concentration:  Concentration: Fair and Attention Span: Fair  Recall:  [employer] of Knowledge:Fair  Language: Fair  Akathisia:  No  Handed:  Right  AIMS (if indicated):  not done  Assets:  Communication Skills Desire for Improvement Housing Social Support Transportation Vocational/Educational  ADL's:  Intact  Cognition: WNL  Sleep:   excessive   Screenings: AUDIT    Flowsheet Row Admission (Discharged) from 03/28/2016 in [facility]  Alcohol Use Disorder Identification Test Final Score (AUDIT) 1      GAD-7    Flowsheet Row Office Visit from 01/08/2023 in [facility] Office Visit from 11/23/2022 in [facility] Office Visit from 11/02/2022 in [facility] Office Visit from 10/19/2022 in [facility] Office Visit from 10/03/2022 in [facility]  Total GAD-7 Score 18 11 11 11 12       PHQ2-9    Flowsheet Row Office Visit from 01/08/2023 in [facility] Office Visit from 11/23/2022 in [facility] Office Visit from 11/02/2022 in [facility] Office Visit from 10/19/2022 in [facility] Office Visit from 10/03/2022 in [facility]  PHQ-2 Total Score 5 4 4 4 5   PHQ-9 Total Score 26 18 17 18 15       Flowsheet Row Office Visit from 01/08/2023 in [facility] ED from 10/31/2021 in [facility]   C-SSRS RISK CATEGORY Moderate Risk High Risk       Assessment and Plan: Kaelin Deman is a 32 year old Caucasian male, with history of depression, anxiety, multiple medical problems including morbid obesity, hypothyroidism, presents with worsening mood symptoms, current medications not effective as well as noncompliance with medications due to running out recently, symptoms exacerbated by his multiple situational stressors including recent death in the family, unemployment, physical limitations due to being morbidly obese, discussed assessment and plan as noted below.  The patient demonstrates the following risk factors for suicide: Chronic risk factors for suicide include: psychiatric disorder of depression, anxiety, previous suicide attempts yes, and medical illness hypothyroidism, morbid obesity . Acute risk factors for suicide include: unemployment and loss (financial, interpersonal, professional). Protective factors for this patient include: positive social support and positive therapeutic relationship. Considering these factors, the overall suicide risk at this point appears to be moderate. Patient is appropriate for outpatient follow up.   Major Depressive Disorder-unstable Joe Haley presents with symptoms of major depressive disorder, including lack of motivation, hopelessness, and disrupted sleep patterns, exacerbated by the recent death of his brother on 12/28/22. He has a history of suicidal ideation and attempts, with the most recent in August 2024. He has been off Lexapro for two weeks, reporting increased irritability. Previously on Wellbutrin for five to six years without significant improvement, he recently started Abilify but needs a refill. Discussed risks of weight gain with Lexapro and benefits of restarting Abilify with metformin to mitigate weight gain. Emphasized adherence to medications to prevent withdrawal symptoms and hospitalizations. Discussed intensive outpatient  therapy through [facility] to provide structured support and prevent hospitalization.   - Refill Lexapro 20 mg   - Restart Abilify , increase to 5 mg.  - Order EKG before increasing Abilify dosage   - Refer to [facility] for intensive outpatient program   - Refer to in-house therapist   - Start metformin 500 mg extended release daily after breakfast to mitigate weight gain side effects of Abilify    Generalized Anxiety Disorder-unstable Joe Haley reports symptoms of generalized anxiety disorder, including excessive worry, overthinking, and occasional panic  attacks. He experiences auditory hallucinations, hearing a woman screaming, which started six to seven years ago and occurs about once a week. Discussed potential need for as-needed medication for panic attacks in future visits.   - Continue current medications   -Patient referred for MH IOP. - Monitor for changes in anxiety symptoms   - Discuss potential need for as-needed medication for panic attacks in future visits    Antipsychotic induced weight gain-unstable -Start metformin 500 mg extended release daily after breakfast to mitigate weight gain side effects of Abilify.   High risk medication use-will order labs including CBC with differential, creatinine, hepatic function, hemoglobin A1c, prolactin, lipid panel.  Patient to go to [facility] lab.  At risk for prolonged QT syndrome-ordered EKG, patient to call [phone] to get this done.  Once I review EKG will consider increasing the dosage of Abilify.   Joe Haley has acquired hypothyroidism and is currently on levothyroxine. He has missed doses in the past few weeks, contributing to weight gain and depressive symptoms. Emphasized importance of daily adherence to levothyroxine to manage symptoms effectively.   - Ensure daily adherence to levothyroxine   - Monitor thyroid function tests regularly    Joe Haley has hyperlipidemia. Emphasized importance of monitoring cholesterol levels, especially  when on medications like Abilify.   - Order blood work to check cholesterol levels   - Discuss management options with primary doctor     Follow-up   - Schedule follow-up appointment in 3-4 weeks   - Ensure completion of blood work and EKG before next visit   - Follow up with primary doctor for weight management and thyroid function.  Collaboration of Care: Referral or follow-up with counselor/therapist AEB I have referred this patient for MH IOP, [facility] as well as reported therapist at our practice.  Communicated with staff to schedule this patient.  Patient/Guardian was advised Release of Information must be obtained prior to any record release in order to collaborate their care with an outside provider. Patient/Guardian was advised if they have not already done so to contact the registration department to sign all necessary forms in order for [place] to release information regarding their care.   Consent: Patient/Guardian gives verbal consent for treatment and assignment of benefits for services provided during this visit. Patient/Guardian expressed understanding and agreed to proceed.   I have spent atleast 60 minutes face to face with patient today which includes the time spent for preparing to see the patient ( e.g., review of test, records ), obtaining and to review and separately obtained history , ordering medications and test ,psychoeducation and supportive psychotherapy and care coordination,as well as documenting clinical information in electronic health record, referring this patient to MH IOP, completed online referral.   Jomarie Longs, MD 12/9/20246:00 PM

## 2023-01-08 NOTE — Patient Instructions (Signed)
Please call for EKG - [phone]   Aripiprazole Tablets What is this medication? ARIPIPRAZOLE (ay ri PIP ray zole) treats schizophrenia, bipolar I disorder, autism spectrum disorder, and Tourette disorder. It may also be used with antidepressant medications to treat depression. It works by balancing the levels of dopamine and serotonin in the brain, hormones that help regulate mood, behaviors, and thoughts. It belongs to a group of medications called antipsychotics. Antipsychotics can be used to treat several kinds of mental health conditions. This medicine may be used for other purposes; ask your health care provider or pharmacist if you have questions. COMMON BRAND NAME(S): Abilify What should I tell my care team before I take this medication? They need to know if you have any of these conditions: Dementia Diabetes Difficulty swallowing Have trouble controlling your muscles Heart disease History of irregular heartbeat History of stroke Low blood cell levels (white cells, red cells, and platelets) Low blood pressure Parkinson disease Seizures Suicidal thoughts, plans, or attempt by you or a family member Urges to engage in impulsive behaviors in ways that are unusual for you An unusual or allergic reaction to aripiprazole, other medications, foods, dyes, or preservatives Pregnant or trying to get pregnant Breastfeeding How should I use this medication? Take this medication by mouth with a glass of water. Take it as directed on the prescription label at the same time every day. You can take it with or without food. If it upsets your stomach, take it with food. Do not take your medication more often than directed. Keep taking it unless your care team tells you to stop. A special MedGuide will be given to you by the pharmacist with each prescription and refill. Be sure to read this information carefully each time. Talk to your care team about the use of this medication in children.  While it may be prescribed for children as young as 6 years for selected conditions, precautions do apply. Overdosage: If you think you have taken too much of this medicine contact a poison control center or emergency room at once. NOTE: This medicine is only for you. Do not share this medicine with others. What if I miss a dose? If you miss a dose, take it as soon as you can. If it is almost time for your next dose, take only that dose. Do not take double or extra doses. What may interact with this medication? Do not take this medication with any of the following: Brexpiprazole Cisapride Dextromethorphan; quinidine Dronedarone Metoclopramide Pimozide Quinidine Thioridazine This medication may also interact with the following: Antihistamines for allergy, cough, and cold Carbamazepine Certain medications for anxiety or sleep Certain medications for depression, such as amitriptyline, fluoxetine, paroxetine, or sertraline Certain medications for fungal infections, such as fluconazole, itraconazole, ketoconazole, posaconazole, or voriconazole Clarithromycin General anesthetics, such as halothane, isoflurane, methoxyflurane, or propofol Medications for Parkinson disease, such as levodopa Medications for blood pressure Medications for seizures Medications that relax muscles for surgery Opioid medications for pain Other medications that cause heart rhythm changes Phenothiazines, such as chlorpromazine or prochlorperazine Rifampin This list may not describe all possible interactions. Give your health care provider a list of all the medicines, herbs, non-prescription drugs, or dietary supplements you use. Also tell them if you smoke, drink alcohol, or use illegal drugs. Some items may interact with your medicine. What should I watch for while using this medication? Visit your care team for regular checks on your progress. Tell your care team if your symptoms do not start  to get better or if  they get worse. Do not suddenly stop taking this medication. You may develop a severe reaction. Your care team will tell you how much medication to take. If your care team wants you to stop the medication, the dose may be slowly lowered over time to avoid any side effects. Patients and their families should watch out for new or worsening depression or thoughts of suicide. Also watch out for sudden changes in feelings such as feeling anxious, agitated, panicky, irritable, hostile, aggressive, impulsive, severely restless, overly excited and hyperactive, or not being able to sleep. If this happens, especially at the beginning of antidepressant treatment or after a change in dose, call your care team. This medication may affect your coordination, reaction time, or judgment. Do not drive or operate machinery until you know how this medication affects you. Sit up or stand slowly to reduce the risk of dizzy or fainting spells. Drinking alcohol with this medication can increase the risk of these side effects. This medication can cause problems with controlling your body temperature. It can lower the response of your body to cold temperatures. If possible, stay indoors during cold weather. If you must go outdoors, wear warm clothes. It can also lower the response of your body to heat. Do not overheat. Do not over-exercise. Stay out of the sun when possible. If you must be in the sun, wear cool clothing. Drink plenty of water. If you have trouble controlling your body temperature, call your care team right away. This medication may cause dry eyes and blurred vision. If you wear contact lenses, you may feel some discomfort. Lubricating eye drops may help. See your care team if the problem does not go away or is severe. This medication may increase blood sugar. Ask your care team if changes in diet or medications are needed if you have diabetes. There have been reports of increased sexual urges or other strong urges such  as gambling while taking this medication. If you experience any of these while taking this medication, you should report this to your care team as soon as possible. What side effects may I notice from receiving this medication? Side effects that you should report to your care team as soon as possible: Allergic reactions--skin rash, itching, hives, swelling of the face, lips, tongue, or throat High blood sugar (hyperglycemia)--increased thirst or amount of urine, unusual weakness or fatigue, blurry vision High fever, stiff muscles, increased sweating, fast or irregular heartbeat, and confusion, which may be signs of neuroleptic malignant syndrome Low blood pressure--dizziness, feeling faint or lightheaded, blurry vision Pain or trouble swallowing Prolonged or painful erection Seizures Stroke--sudden numbness or weakness of the face, arm, or leg, trouble speaking, confusion, trouble walking, loss of balance or coordination, dizziness, severe headache, change in vision Uncontrolled and repetitive body movements, muscle stiffness or spasms, tremors or shaking, loss of balance or coordination, restlessness, shuffling walk, which may be signs of extrapyramidal symptoms (EPS) Thoughts of suicide or self-harm, worsening mood, feelings of depression Urges to engage in impulsive behaviors such as gambling, binge eating, sexual activity, or shopping in ways that are unusual for you Side effects that usually do not require medical attention (report these to your care team if they continue or are bothersome): Constipation Drowsiness Weight gain This list may not describe all possible side effects. Call your doctor for medical advice about side effects. You may report side effects to FDA at 1-800-FDA-1088. Where should I keep my medication? Keep out of  the reach of children and pets. Store at room temperature between 15 and 30 degrees C (59 and 86 degrees F). Throw away any unused medication after the  expiration date. NOTE: This sheet is a summary. It may not cover all possible information. If you have questions about this medicine, talk to your doctor, pharmacist, or health care provider.  2024 [employer]vier/Gold Standard (2021-08-06 00:00:00)

## 2023-01-09 ENCOUNTER — Telehealth: Payer: Self-pay

## 2023-01-09 DIAGNOSIS — T50905A Adverse effect of unspecified drugs, medicaments and biological substances, initial encounter: Secondary | ICD-10-CM

## 2023-01-09 NOTE — Telephone Encounter (Signed)
received fax that a prior auth was needed for the metformin

## 2023-01-09 NOTE — Telephone Encounter (Signed)
went online to [email or web address] and submitted the prior auth . - pending 

## 2023-01-10 NOTE — Telephone Encounter (Signed)
metformin was denied. (info is in your basket up front)

## 2023-01-11 MED ORDER — TOPIRAMATE 25 MG PO TABS: 25.0000 mg | ORAL_TABLET | Freq: Every day | ORAL | 1 refills | Status: DC

## 2023-01-11 NOTE — Telephone Encounter (Signed)
Contacted patient to discuss metformin was denied by insurance.  Will start Topamax 25 mg daily.  Patient reports he was denied by [facility] health due to his insurance-IOP program.  They referred him to another program and he has an appointment in February.  Patient scheduled to see our in-house therapist in December.

## 2023-01-16 ENCOUNTER — Encounter: Payer: Self-pay | Admitting: Nurse Practitioner

## 2023-01-16 ENCOUNTER — Ambulatory Visit (INDEPENDENT_AMBULATORY_CARE_PROVIDER_SITE_OTHER): Payer: MEDICAID | Admitting: Nurse Practitioner

## 2023-01-16 VITALS — BP 104/69 | HR 80 | Temp 98.5°F | Ht 71.0 in | Wt >= 6400 oz

## 2023-01-16 DIAGNOSIS — F333 Major depressive disorder, recurrent, severe with psychotic symptoms: Secondary | ICD-10-CM | POA: Diagnosis not present

## 2023-01-16 NOTE — Assessment & Plan Note (Signed)
Chronic. Ongoing concern especially with patient being on antipsychotic.  Metformin was denied by insurance but Topamax was started by Psychiatrist.  Can increase dose if patient feels it is needed at next visit.  Follow up in 3 months.  Call sooner if concerns arise.

## 2023-01-16 NOTE — Assessment & Plan Note (Addendum)
Chronic. Recently saw psychiatry who restarted Lexapro and increased his Abilify.  Patient will be starting IOP in February.  EKG done in office to evaluate QT interval- 411 on EKG.  Labs ordered. Will send to psychiatrist.  Follow up in 3 months.  Call sooner if concerns arise.

## 2023-01-16 NOTE — Progress Notes (Signed)
BP 104/69 (BP Location: Left Arm, Patient Position: Sitting, Cuff Size: Large)   Pulse 80   Temp 98.5 F (36.9 C) (Oral)   Ht 5\' 11"  (1.803 m)   Wt (!) 408 lb (185.1 kg)   SpO2 93%   BMI 56.90 kg/m    Subjective:    Patient ID: Joe Haley, male    DOB: 04-May-1990, 32 y.o.   MRN: [id number]  HPI: Joe Haley is a 32 y.o. male  Chief Complaint  Patient presents with   Depression   Anxiety   6 week follow up   DEPRESSION  Patient recently saw Dr. Elna Breslow for psychiatry.  His Abilify was increased and Lexapro restarted.  Initially metformin was prescribed for weight management but insurance denied it.  Topamax was sent in to help mitigate weight gain from Abilify.  He is not able to see [facility] due to insurance but they facilitated him finding another option for IOP.  Satisfied with current treatment?: no Symptom severity: severe  Duration of current treatment : several weeks  Side effects: no Medication compliance: good compliance Psychotherapy/counseling: no  Previous psychiatric medications: lexapro  Depressed mood: yes Anxious mood: yes Anhedonia: yes Significant weight loss or gain: no Insomnia:  It's been pretty bad the past couple days -   hard to fall asleep Fatigue: yes Feelings of worthlessness or guilt: yes Impaired concentration/indecisiveness: yes Suicidal ideations: yes- reports thoughts that he would rather not be here but denies plans  He reports he still has safety plan and will reach out to mother for more severe thoughts  Hopelessness: yes Crying spells: no             11/02/2022    9:16 AM 10/19/2022   10:22 AM 10/03/2022    9:49 AM 09/19/2022    4:07 PM 09/05/2022    9:30 AM  Depression screen PHQ 2/9  Decreased Interest 2 2 3 3 2   Down, Depressed, Hopeless 2 2 2 3 2   PHQ - 2 Score 4 4 5 6 4   Altered sleeping 2 2 1 3 3   Tired, decreased energy 2 2 2 3 3   Change in appetite 2 2 2 3 3   Feeling bad or failure about yourself   2 3 2 3 3   Trouble concentrating 2 2 1 3 3   Moving slowly or fidgety/restless 1 2 1 3 3   Suicidal thoughts 2 1 1 3 2   PHQ-9 Score 17 18 15 27 24   Difficult doing work/chores Extremely dIfficult Extremely dIfficult Extremely dIfficult Extremely dIfficult Very difficult        11/02/2022    9:17 AM 10/19/2022   10:22 AM 10/03/2022    9:50 AM 09/19/2022    4:07 PM  GAD 7 : Generalized Anxiety Score  Nervous, Anxious, on Edge 2 2 2 3   Control/stop worrying 2 1 2 3   Worry too much - different things 2 2 2 3   Trouble relaxing 2 2 2 3   Restless 1 1 1  0  Easily annoyed or irritable 0 1 1 0  Afraid - awful might happen 2 2 2 3   Total GAD 7 Score 11 11 12 15   Anxiety Difficulty Extremely difficult Extremely difficult Somewhat difficult Extremely difficult    UPPER RESPIRATORY TRACT INFECTION Worst symptom: symptoms started 5 days ago Fever: no Cough: yes Shortness of breath: yes Wheezing: yes Chest pain: yes, with cough Chest tightness: yes Chest congestion: yes Nasal congestion: yes Runny nose: no Post nasal  drip: no Sneezing: no Sore throat: no Swollen glands: no Sinus pressure: no Headache: yes Face pain: no Toothache: no Ear pain: no bilateral Ear pressure: no bilateral Eyes red/itching:no Eye drainage/crusting: no  Vomiting: no Rash: no Fatigue: yes Sick contacts: no Strep contacts: no  Context: worse Recurrent sinusitis: no Relief with OTC cold/cough medications: no  Treatments attempted: cough syrup      Relevant past medical, surgical, family and social history reviewed and updated as indicated. Interim medical history since our last visit reviewed. Allergies and medications reviewed and updated.  Review of Systems  Constitutional:  Positive for unexpected weight change.  Psychiatric/Behavioral:  Positive for dysphoric mood and sleep disturbance. Negative for suicidal ideas. The patient is nervous/anxious.     Per HPI unless specifically indicated above      Objective:    BP 104/69 (BP Location: Left Arm, Patient Position: Sitting, Cuff Size: Large)   Pulse 80   Temp 98.5 F (36.9 C) (Oral)   Ht 5\' 11"  (1.803 m)   Wt (!) 408 lb (185.1 kg)   SpO2 93%   BMI 56.90 kg/m   Wt Readings from Last 3 Encounters:  01/16/23 (!) 408 lb (185.1 kg)  01/08/23 (!) 419 lb 3.2 oz (190.1 kg)  11/23/22 (!) 381 lb 12.8 oz (173.2 kg)    Physical Exam Vitals and nursing note reviewed.  Constitutional:      General: He is not in acute distress.    Appearance: Normal appearance. He is not ill-appearing, toxic-appearing or diaphoretic.  HENT:     Head: Normocephalic.     Right Ear: External ear normal.     Left Ear: External ear normal.     Nose: Nose normal. No congestion or rhinorrhea.     Mouth/Throat:     Mouth: Mucous membranes are moist.  Eyes:     General:        Right eye: No discharge.        Left eye: No discharge.     Extraocular Movements: Extraocular movements intact.     Conjunctiva/sclera: Conjunctivae normal.     Pupils: Pupils are equal, round, and reactive to light.  Cardiovascular:     Rate and Rhythm: Normal rate and regular rhythm.     Heart sounds: No murmur heard. Pulmonary:     Effort: Pulmonary effort is normal. No respiratory distress.     Breath sounds: Normal breath sounds. No wheezing, rhonchi or rales.  Abdominal:     General: Abdomen is flat. Bowel sounds are normal.  Musculoskeletal:     Cervical back: Normal range of motion and neck supple.  Skin:    General: Skin is warm and dry.     Capillary Refill: Capillary refill takes less than 2 seconds.  Neurological:     General: No focal deficit present.     Mental Status: He is alert and oriented to person, place, and time.  Psychiatric:        Mood and Affect: Mood normal.        Behavior: Behavior normal.        Thought Content: Thought content normal.        Judgment: Judgment normal.     Results for orders placed or performed in visit on 10/19/22  TSH    Collection Time: 10/19/22 10:45 AM  Result Value Ref Range   TSH 2.780 0.450 - 4.500 uIU/mL      Assessment & Plan:   Problem List Items Addressed This Visit  Other   Morbid obesity (HCC)   Chronic. Ongoing concern especially with patient being on antipsychotic.  Metformin was denied by insurance but Topamax was started by Psychiatrist.  Can increase dose if patient feels it is needed at next visit.  Follow up in 3 months.  Call sooner if concerns arise.      Relevant Orders   CBC w/Diff   Creatinine   Hepatic function panel   HgB A1c   Prolactin   Lipid Profile   Severe episode of recurrent major depressive disorder, with psychotic features (HCC) - Primary   Chronic. Recently saw psychiatry who restarted Lexapro and increased his Abilify.  Patient will be starting IOP in February.  EKG done in office to evaluate QT interval- 411 on EKG.  Labs ordered. Will send to psychiatrist.  Follow up in 3 months.  Call sooner if concerns arise.       Relevant Orders   CBC w/Diff   Creatinine   Hepatic function panel   HgB A1c   Prolactin   Lipid Profile   EKG 12-Lead     Follow up plan: Return in about 3 months (around 04/16/2023) for Depression/Anxiety FU.

## 2023-01-17 LAB — HEPATIC FUNCTION PANEL
ALT: 26 [IU]/L (ref 0–44)
AST: 21 [IU]/L (ref 0–40)
Albumin: 4.7 g/dL (ref 4.1–5.1)
Alkaline Phosphatase: 83 [IU]/L (ref 44–121)
Bilirubin Total: 0.7 mg/dL (ref 0.0–1.2)
Bilirubin, Direct: 0.2 mg/dL (ref 0.00–0.40)
Total Protein: 7.1 g/dL (ref 6.0–8.5)

## 2023-01-17 LAB — CBC WITH DIFFERENTIAL/PLATELET
Basophils Absolute: 0.1 10*3/uL (ref 0.0–0.2)
Basos: 1 %
EOS (ABSOLUTE): 0.4 10*3/uL (ref 0.0–0.4)
Eos: 3 %
Hematocrit: 47.9 % (ref 37.5–51.0)
Hemoglobin: 16 g/dL (ref 13.0–17.7)
Immature Grans (Abs): 0.1 10*3/uL (ref 0.0–0.1)
Immature Granulocytes: 1 %
Lymphocytes Absolute: 2.6 10*3/uL (ref 0.7–3.1)
Lymphs: 24 %
MCH: 29.4 pg (ref 26.6–33.0)
MCHC: 33.4 g/dL (ref 31.5–35.7)
MCV: 88 fL (ref 79–97)
Monocytes Absolute: 0.8 10*3/uL (ref 0.1–0.9)
Monocytes: 7 %
Neutrophils Absolute: 7.1 10*3/uL — ABNORMAL HIGH (ref 1.4–7.0)
Neutrophils: 64 %
Platelets: 333 10*3/uL (ref 150–450)
RBC: 5.45 x10E6/uL (ref 4.14–5.80)
RDW: 13.7 % (ref 11.6–15.4)
WBC: 11.1 10*3/uL — ABNORMAL HIGH (ref 3.4–10.8)

## 2023-01-17 LAB — LIPID PANEL
Chol/HDL Ratio: 3.5 {ratio} (ref 0.0–5.0)
Cholesterol, Total: 144 mg/dL (ref 100–199)
HDL: 41 mg/dL (ref >39)
LDL Chol Calc (NIH): 77 mg/dL (ref 0–99)
Triglycerides: 149 mg/dL (ref 0–149)
VLDL Cholesterol Cal: 26 mg/dL (ref 5–40)

## 2023-01-17 LAB — CREATININE, SERUM
Creatinine, Ser: 0.84 mg/dL (ref 0.76–1.27)
eGFR: 119 mL/min/{1.73_m2} (ref >59)

## 2023-01-17 LAB — HEMOGLOBIN A1C
Est. average glucose Bld gHb Est-mCnc: 111 mg/dL
Hgb A1c MFr Bld: 5.5 % (ref 4.8–5.6)

## 2023-01-17 LAB — PROLACTIN: Prolactin: 14.7 ng/mL (ref 3.9–22.7)

## 2023-01-29 ENCOUNTER — Ambulatory Visit (INDEPENDENT_AMBULATORY_CARE_PROVIDER_SITE_OTHER): Payer: MEDICAID | Admitting: Professional Counselor

## 2023-01-29 DIAGNOSIS — F333 Major depressive disorder, recurrent, severe with psychotic symptoms: Secondary | ICD-10-CM | POA: Diagnosis not present

## 2023-01-29 DIAGNOSIS — F411 Generalized anxiety disorder: Secondary | ICD-10-CM | POA: Diagnosis not present
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
## 2023-02-15 ENCOUNTER — Ambulatory Visit: Payer: MEDICAID | Admitting: Professional Counselor

## 2023-02-15 DIAGNOSIS — F333 Major depressive disorder, recurrent, severe with psychotic symptoms: Secondary | ICD-10-CM

## 2023-02-15 NOTE — Progress Notes (Signed)
   THERAPIST PROGRESS NOTE  Session Time: 9:00 AM - 9:35 AM  Participation Level: Active  Behavioral Response: Casual, Drowsy, Dysphoric  Type of Therapy: Individual Therapy  Treatment Goals addressed: Active OP Depression  LTG: "I just want to be more motivated. Have a better outlook. I'd like to be in a better head space so I can start working again and be better health wise.     Start:  02/15/23    Expected End:  02/14/24     STG: Increase coping skills to manage depression and improve ability to perform daily activities   STG: Improve thinking patterns AEB identifying and restructuring cognitive distortions over the next 12 weeks.    STG: Reduce unhealthy coping mechanisms such as stress eating by implementing positive replacement behaviors and other emotion regulation skills over the next 12 weeks   ProgressTowards Goals: Initial  Interventions: CBT and Motivational Interviewing  Summary: Mate Rodeffer is a 33 y.o. male who presents with MDD, recurrent, severe, with psychotic features and hx of SI/attempts. [name] appears drowsy but oriented x5. His mood is dysphoric but he is cooperative during session. He reported not much change since his initial CCA. He reported he has started walking which seems helpful. [name] engaged in developing his treatment plan. [name] reports his last engagement in therapy was approximately 6 years ago and he doesn't really remember any skills they taught him. He actively listened to psychoeducation and engaged in 5-4-3-2-1. He was in agreement to start keeping a thought log and noted he has a journal he tries to write in sometimes. He noted his mind seems empty and this started after his brother died. He was receptive to dissociative symptoms and how mindfulness exercises may help him start to reconnect with his body/mind/emotions. [name] was in agreement with bi-weekly sessions. He expressed understanding to start practicing coping skills.  Therapist  Response: Conducted session with [employer]. Began session with check-in/update since previous session. Used open-ended questions to facilitate discussion and utilized empathetic and reflective listening. Developed treatment plan with input from [place] on current strengths, needs, and progress towards goals. Provided psychoeduation on coping skills, breathing exercises, and engaged [name] in 5-4-3-2-1 grounding skill. Discussed cognitive triangle and encouraged [name] to begin a thought log to analyze in future sessions. Discussed dissociative symptoms and normalized them after experiencing grief. Encouraged [name] to practice connecting with his body sensations as we begin to explore his grief more. Scheduled follow up appointment and printed handouts for coping skills. Concluded session.   Suicidal/Homicidal:  No  Plan: Return again in 2 weeks.  Diagnosis: Severe episode of recurrent major depressive disorder, with psychotic features (HCC)  Collaboration of Care: Medication Management AEB chart review  Patient/Guardian was advised Release of Information must be obtained prior to any record release in order to collaborate their care with an outside provider. Patient/Guardian was advised if they have not already done so to contact the registration department to sign all necessary forms in order for [place] to release information regarding their care.   Consent: Patient/Guardian gives verbal consent for treatment and assignment of benefits for services provided during this visit. Patient/Guardian expressed understanding and agreed to proceed.   Edmonia Lynch, [facility] 02/15/2023

## 2023-03-02 ENCOUNTER — Ambulatory Visit (INDEPENDENT_AMBULATORY_CARE_PROVIDER_SITE_OTHER): Payer: MEDICAID | Admitting: Professional Counselor

## 2023-03-02 DIAGNOSIS — F333 Major depressive disorder, recurrent, severe with psychotic symptoms: Secondary | ICD-10-CM | POA: Diagnosis not present

## 2023-03-02 NOTE — Progress Notes (Unsigned)
   THERAPIST PROGRESS NOTE  Session Time: 8:00 AM - 8:50 AM   Participation Level: {BHH PARTICIPATION LEVEL:22264}  Behavioral Response: {Appearance:22683}{BHH LEVEL OF CONSCIOUSNESS:22305}{BHH MOOD:22306}  Type of Therapy: {CHL AMB BH Type of Therapy:21022741}  Treatment Goals addressed: ***  ProgressTowards Goals: {Progress Towards Goals:21014066}  Interventions: {CHL AMB BH Type of Intervention:21022753}  Summary: Joe Haley is a 33 y.o. male who presents with ***.   Suicidal/Homicidal: {BHH YES OR NO:22294}{yes/no/with/without intent/plan:22693}  Therapist Response: Conducted session with [name]. Began session with check-in/update since previous session. Used empathetic and reflective listening. Reviewed completed homework assignment. Provided psychoeducation on cognitive distortions and assisted with identifying and restructuring unhealthy thinking patterns. Used open-ended questions to facilitate discussion and encouraged [name] to "check the facts" with his negative thinking. Engaged in writing exercise - donut/circles of control. Encouraged [name] to focus on what's within his control. Scheduled next appointment and concluded session.   Plan: Return again in 3 weeks.  Diagnosis: Severe episode of recurrent major depressive disorder, with psychotic features (HCC)  Collaboration of Care: {BH OP Collaboration of Care:21014065}  Patient/Guardian was advised Release of Information must be obtained prior to any record release in order to collaborate their care with an outside provider. Patient/Guardian was advised if they have not already done so to contact the registration department to sign all necessary forms in order for [place] to release information regarding their care.   Consent: Patient/Guardian gives verbal consent for treatment and assignment of benefits for services provided during this visit. Patient/Guardian expressed understanding and agreed to proceed.   Edmonia Lynch, [facility] 03/02/2023

## 2023-03-07 ENCOUNTER — Ambulatory Visit: Payer: Self-pay | Admitting: Psychiatry

## 2023-03-16 ENCOUNTER — Other Ambulatory Visit: Payer: Self-pay | Admitting: Nurse Practitioner

## 2023-03-16 NOTE — Telephone Encounter (Signed)
Medication Refill -  Most Recent Primary Care Visit:  Provider: Larae Grooms  Department: [facility]  Visit Type: OFFICE VISIT  Date: 01/16/2023  Medication: levothyroxine (SYNTHROID) 200 MCG tablet [[id number]]   Has the patient contacted their pharmacy? Yes (Agent: If no, request that the patient contact the pharmacy for the refill. If patient does not wish to contact the pharmacy document the reason why and proceed with request.) (Agent: If yes, when and what did the pharmacy advise?)  Is this the correct pharmacy for this prescription? Yes If no, delete pharmacy and type the correct one.  This is the patient's preferred pharmacy:  [facility] [address], [place] - [address] [place] [address] Phone: [phone] Fax: [phone]   Has the prescription been filled recently? Yes  Is the patient out of the medication? Yes Patient has been out of medication for 1 day  Has the patient been seen for an appointment in the last year OR does the patient have an upcoming appointment? Yes  Can we respond through MyChart? Yes  Agent: Please be advised that Rx refills may take up to 3 business days. We ask that you follow-up with your pharmacy.

## 2023-03-19 ENCOUNTER — Ambulatory Visit (INDEPENDENT_AMBULATORY_CARE_PROVIDER_SITE_OTHER): Payer: MEDICAID | Admitting: Professional Counselor

## 2023-03-19 DIAGNOSIS — F332 Major depressive disorder, recurrent severe without psychotic features: Secondary | ICD-10-CM | POA: Diagnosis not present

## 2023-03-19 MED ORDER — LEVOTHYROXINE SODIUM 200 MCG PO TABS: 200.0000 ug | ORAL_TABLET | Freq: Every day | ORAL | 0 refills | Status: DC

## 2023-03-19 NOTE — Telephone Encounter (Signed)
Requested Prescriptions  Pending Prescriptions Disp Refills   levothyroxine (SYNTHROID) 200 MCG tablet 90 tablet 1    Sig: Take 1 tablet (200 mcg total) by mouth daily.     Endocrinology:  Hypothyroid Agents Passed - 03/19/2023  8:30 AM      Passed - TSH in normal range and within 360 days    TSH  Date Value Ref Range Status  10/19/2022 2.780 0.450 - 4.500 uIU/mL Final         Passed - Valid encounter within last 12 months    Recent Outpatient Visits           2 months ago Severe episode of recurrent major depressive disorder, with psychotic features [facility])   [place] [facility] Larae Grooms, NP   3 months ago Severe recurrent major depression without psychotic features [facility])   [place] [facility] Larae Grooms, NP   4 months ago Severe recurrent major depression without psychotic features (HCC)   [place] [facility] Mecum, Oswaldo Conroy, PA-C   5 months ago Acquired hypothyroidism   [place] [facility] [place], Megan P, DO   5 months ago Severe recurrent major depression without psychotic features (HCC)   [place] [facility] Mecum, Oswaldo Conroy, PA-C       Future Appointments             In 4 weeks Larae Grooms, NP [place] [facility]

## 2023-03-19 NOTE — Progress Notes (Signed)
   THERAPIST PROGRESS NOTE  Session Time: 9:01 AM - 9:48 AM   Participation Level: Active  Behavioral Response: Disheveled, Alert, Dysphoric  Type of Therapy: Individual Therapy  Treatment Goals addressed:  Active OP Depression  LTG: "I just want to be more motivated. Have a better outlook. I'd like to be in a better head space so I can start working again and be better health wise.                 Start:  02/15/23    Expected End:  02/14/24      STG: Increase coping skills to manage depression and improve ability to perform daily activities    STG: Improve thinking patterns AEB identifying and restructuring cognitive distortions over the next 12 weeks.     STG: Reduce unhealthy coping mechanisms such as stress eating by implementing positive replacement behaviors and other emotion regulation skills over the next 12 weeks   ProgressTowards Goals: Progressing  Interventions: CBT  Summary: Nithin Demeo is a 33 y.o. male who presents with a history of MDD. He appeared dysphoric but oriented x5. He noted he hasn't been walking but has been hanging out with his friend discussed in last session. He was in agreement talking about boundaries would be helpful. [name] noted a struggle with being assertive. He actively engaged in discussion and practiced during session. He identified scenarios from his life and practiced with those situations as well. [name] did well identifying assertive responses/boundaries and was receptive to additional feedback from this Clinical research associate.   Therapist Response: Conducted session with [employer]. Began session with check-in/update since previous session. Utilized empathetic and reflective listening. Engaged in discussion about boundaries and assertiveness. Practiced boundary setting with various scenarios using handouts and scenarios [name] identified within his current life. Encouraged [name] to practice between now and next session. Scheduled additional follow-up appointments  and concluded session.  Suicidal/Homicidal: No  Plan: Return again in 4 weeks.  Diagnosis: Severe recurrent major depression without psychotic features (HCC)  Collaboration of Care: Medication Management AEB chart review  Patient/Guardian was advised Release of Information must be obtained prior to any record release in order to collaborate their care with an outside provider. Patient/Guardian was advised if they have not already done so to contact the registration department to sign all necessary forms in order for [place] to release information regarding their care.   Consent: Patient/Guardian gives verbal consent for treatment and assignment of benefits for services provided during this visit. Patient/Guardian expressed understanding and agreed to proceed.   Edmonia Lynch, [facility] 03/19/2023

## 2023-03-21 ENCOUNTER — Encounter: Payer: Self-pay | Admitting: Psychiatry

## 2023-03-21 ENCOUNTER — Telehealth (INDEPENDENT_AMBULATORY_CARE_PROVIDER_SITE_OTHER): Payer: MEDICAID | Admitting: Psychiatry

## 2023-03-21 DIAGNOSIS — F411 Generalized anxiety disorder: Secondary | ICD-10-CM

## 2023-03-21 DIAGNOSIS — R635 Abnormal weight gain: Secondary | ICD-10-CM

## 2023-03-21 DIAGNOSIS — F332 Major depressive disorder, recurrent severe without psychotic features: Secondary | ICD-10-CM | POA: Diagnosis not present

## 2023-03-21 DIAGNOSIS — T43595A Adverse effect of other antipsychotics and neuroleptics, initial encounter: Secondary | ICD-10-CM

## 2023-03-21 DIAGNOSIS — Z634 Disappearance and death of family member: Secondary | ICD-10-CM

## 2023-03-21 MED ORDER — ESCITALOPRAM OXALATE 20 MG PO TABS: 20.0000 mg | ORAL_TABLET | Freq: Every day | ORAL | 1 refills | Status: DC

## 2023-03-21 MED ORDER — TOPIRAMATE 25 MG PO TABS: 25.0000 mg | ORAL_TABLET | Freq: Every day | ORAL | 1 refills | Status: DC

## 2023-03-21 MED ORDER — CARIPRAZINE HCL 1.5 MG PO CAPS: 1.5000 mg | ORAL_CAPSULE | Freq: Every day | ORAL | 1 refills | Status: DC

## 2023-03-21 NOTE — Progress Notes (Unsigned)
Virtual Visit via Video Note  I connected with Anastasia Pall on 03/21/23 at  9:00 AM EST by a video enabled telemedicine application and verified that I am speaking with the correct person using two identifiers.  Location Provider Location : [facility] Patient Location : Home  Participants: Patient , Provider   I discussed the limitations of evaluation and management by telemedicine and the availability of in person appointments. The patient expressed understanding and agreed to proceed.   I discussed the assessment and treatment plan with the patient. The patient was provided an opportunity to ask questions and all were answered. The patient agreed with the plan and demonstrated an understanding of the instructions.   The patient was advised to call back or seek an in-person evaluation if the symptoms worsen or if the condition fails to improve as anticipated.   BH MD OP Progress Note  03/22/2023 8:23 AM Bishoy Cupp  MRN:  [id number]  Chief Complaint:  Chief Complaint  Patient presents with   Follow-up   Depression   Medication Refill   Grief   Anxiety   HPI: Junius Faucett is a 33 year old Caucasian male, unemployed, lives in [place] with his parents, has a history of MDD, GAD, bereavement, hypothyroidism, morbid obesity, hyperlipidemia, was evaluated by telemedicine today for a follow-up appointment.  The patient is currently undergoing therapy with Ms.Elizabeth Ganey. He has been managing medication side effects, including weight gain. Previously on Abilify 5 mg, he recently switched to Vraylar 1.5 mg, as Abilify was not effective.  Patient reports he was seen by a provider in [place] recently who changed her medication.  He believes this was a mistake since he may have been referred to this provider at the same time he was provided a referral to this practice and he never canceled that appointment although he had started seeing provider at this practice.  He is  planning to call and cancel any follow-ups with him so he can continue his care at this practice.  He has been taking Vraylar for two days. He is also on Lexapro 20 mg, Synthroid 200 mcg, Topamax 25 mg, and Trazodone as needed for sleep.  He has generalized anxiety disorder and is working with therapist on developing coping strategies.  Denies thoughts of self-harm or harm to others. He is actively engaging in therapy to address grief related to the loss of his brother.  His sleep has improved over the past few days, with him now sleeping approximately eight hours per night and feeling rested upon waking.  All recent lab work, including prolactin and cholesterol levels, were within normal limits.  Visit Diagnosis:    ICD-10-CM   1. Severe recurrent major depression without psychotic features (HCC)  F33.2 cariprazine (VRAYLAR) 1.5 MG capsule    2. GAD (generalized anxiety disorder)  F41.1 escitalopram (LEXAPRO) 20 MG tablet    3. Bereavement  Z63.4     4. Weight gain due to medication  R63.5 topiramate (TOPAMAX) 25 MG tablet   T50.905A    due to antipsychotic medication      Past Psychiatric History: I have reviewed past psychiatric history from progress note on 01/08/2023.  Patient with history of suicide attempts x 2 in the past.  Past Medical History:  Past Medical History:  Diagnosis Date   Acanthosis nigricans    Hyperlipidemia    Hypothyroidism    Ibuprofen overdose    Major depressive disorder    Obesity    Restless leg syndrome  History reviewed. No pertinent surgical history.  Family Psychiatric History: I have reviewed family psychiatric history from progress note on 01/08/2023.  Family History:  Family History  Problem Relation Age of Onset   Depression Sister    Depression Maternal Grandmother    Diabetes Maternal Grandmother     Social History: I have reviewed social history from progress note on 01/08/2023. Social History   Socioeconomic History   Marital  status: Single    Spouse name: Not on file   Number of children: Not on file   Years of education: Not on file   Highest education level: High school graduate  Occupational History   Not on file  Tobacco Use   Smoking status: Never   Smokeless tobacco: Never  Vaping Use   Vaping status: Never Used  Substance and Sexual Activity   Alcohol use: No   Drug use: Yes    Types: Marijuana    Comment: occasional   Sexual activity: Not Currently  Other Topics Concern   Not on file  Social History Narrative   Not on file   Social Drivers of Health   Financial Resource Strain: High Risk (01/29/2023)   Overall Financial Resource Strain (CARDIA)    Difficulty of Paying Living Expenses: Hard  Food Insecurity: No Food Insecurity (01/29/2023)   Hunger Vital Sign    Worried About Running Out of Food in the Last Year: Never true    Ran Out of Food in the Last Year: Never true  Transportation Needs: No Transportation Needs (01/29/2023)   PRAPARE - Administrator, Civil Service (Medical): No    Lack of Transportation (Non-Medical): No  Physical Activity: Inactive (01/29/2023)   Exercise Vital Sign    Days of Exercise per Week: 0 days    Minutes of Exercise per Session: 0 min  Stress: Stress Concern Present (01/29/2023)   [employer] of Occupational Health - Occupational Stress Questionnaire    Feeling of Stress : Very much  Social Connections: Socially Isolated (01/29/2023)   Social Connection and Isolation Panel [NHANES]    Frequency of Communication with Friends and Family: Once a week    Frequency of Social Gatherings with Friends and Family: Once a week    Attends Religious Services: Never    Database administrator or Organizations: No    Attends Engineer, structural: Never    Marital Status: Never married    Allergies: No Known Allergies  Metabolic Disorder Labs: Lab Results  Component Value Date   HGBA1C 5.5 01/16/2023   MPG 88 03/29/2016   Lab  Results  Component Value Date   PROLACTIN 14.7 01/16/2023   PROLACTIN 77.0 (H) 03/29/2016   Lab Results  Component Value Date   CHOL 144 01/16/2023   TRIG 149 01/16/2023   HDL 41 01/16/2023   CHOLHDL 3.5 01/16/2023   VLDL 36 03/29/2016   LDLCALC 77 01/16/2023   LDLCALC 66 04/12/2021   Lab Results  Component Value Date   TSH 2.780 10/19/2022   TSH 0.718 10/31/2021    Therapeutic Level Labs: No results found for: "LITHIUM" No results found for: "VALPROATE" No results found for: "CBMZ"  Current Medications: Current Outpatient Medications  Medication Sig Dispense Refill   levothyroxine (SYNTHROID) 200 MCG tablet Take 1 tablet (200 mcg total) by mouth daily. 90 tablet 0   traZODone (DESYREL) 50 MG tablet Take 1 tablet (50 mg total) by mouth at bedtime. 30 tablet 0   [START ON 04/13/2023]  cariprazine (VRAYLAR) 1.5 MG capsule Take 1 capsule (1.5 mg total) by mouth daily. 30 capsule 1   escitalopram (LEXAPRO) 20 MG tablet Take 1 tablet (20 mg total) by mouth daily. 30 tablet 1   topiramate (TOPAMAX) 25 MG tablet Take 1 tablet (25 mg total) by mouth daily. 30 tablet 1   No current facility-administered medications for this visit.     Musculoskeletal: Strength & Muscle Tone:  UTA Gait & Station:  Seated Patient leans: N/A  Psychiatric Specialty Exam: Review of Systems  Psychiatric/Behavioral:  Positive for dysphoric mood. The patient is nervous/anxious.     There were no vitals taken for this visit.There is no height or weight on file to calculate BMI.  General Appearance: Fairly Groomed  Eye Contact:  Fair  Speech:  Clear and Coherent  Volume:  Normal  Mood:  Anxious and Depressed  Affect:  Appropriate  Thought Process:  Goal Directed and Descriptions of Associations: Intact  Orientation:  Full (Time, Place, and Person)  Thought Content: Logical   Suicidal Thoughts:  No  Homicidal Thoughts:  No  Memory:  Immediate;   Fair Recent;   Fair Remote;   Fair  Judgement:   Fair  Insight:  Fair  Psychomotor Activity:  Normal  Concentration:  Concentration: Fair and Attention Span: Fair  Recall:  [employer] of Knowledge: Fair  Language: Fair  Akathisia:  No  Handed:  Right  AIMS (if indicated): not done  Assets:  Desire for Improvement Housing Social Support  ADL's:  Intact  Cognition: WNL  Sleep:  Fair   Screenings: AUDIT    Advertising copywriter from 01/29/2023 in [facility] Admission (Discharged) from 03/28/2016 in [facility]  Alcohol Use Disorder Identification Test Final Score (AUDIT) 3 1      GAD-7    Flowsheet Row Counselor from 01/29/2023 in [facility] Office Visit from 01/16/2023 in [facility] Office Visit from 01/08/2023 in [facility] Office Visit from 11/23/2022 in [facility] Office Visit from 11/02/2022 in [facility]  Total GAD-7 Score 18 12 18 11 11       PHQ2-9    Flowsheet Row Counselor from 01/29/2023 in [facility] Office Visit from 01/16/2023 in [facility] Office Visit from 01/08/2023 in [facility] Office Visit from 11/23/2022 in [facility] Office Visit from 11/02/2022 in [facility]  PHQ-2 Total Score 6 4 5 4 4   PHQ-9 Total Score 21 18 26 18 17       Flowsheet Row Video Visit from 03/21/2023 in [facility] Counselor from 01/29/2023 in [facility] Office Visit from 01/08/2023 in [facility]  C-SSRS RISK CATEGORY Moderate Risk Moderate Risk Moderate Risk        Assessment and Plan: Kieren Adkison is a 33 year old Caucasian  male with history of depression, anxiety, multiple medical problems was evaluated by telemedicine today.  Patient is currently improving with recent changes in his medication to Vraylar, discussed assessment and plan as noted below.  Major Depression-improving He was previously on Abilify 5 mg, which was ineffective. Recently switched to Vraylar 1.5 mg by another provider, and it is too early to assess its efficacy. Leafy Kindle is affordable for him. He reports  good sleep, no suicidal ideation, and is engaging in exercise and therapy. Discussed continuing care with either the current provider or the new provider at [facility].   - Discontinue Abilify   - Continue Vraylar 1.5 mg   - Continue Topamax 25 mg daily for side effects of weight gain from atypical antipsychotic.  Patient could not afford Metformin. - Monitor for side effects and efficacy of Vraylar  Generalized Anxiety Disorder-improving Earna Coder has generalized anxiety disorder and is taking Lexapro 20 mg daily.   - Continue Lexapro 20 mg daily    Bereavement-improving Patient currently reports improvement in his grief reaction. - Continue Trazodone 50 mg at bedtime as needed for sleep - Continue psychotherapy with Ms. Janina Mayo.  Weight Gain due to Antipsychotic Medication-improving Earna Coder is taking Topamax 25 mg to manage weight gain associated with antipsychotic medication.   - Continue Topamax 25 mg daily     Follow-up   - Follow up with therapist Lester Acacia Villas on March 20 and April 1   - Follow up for medication management on April 3 at 9:30 AM   - Place on waitlist for earlier appointment if available.  Collaboration of Care: Collaboration of Care: Referral or follow-up with counselor/therapist AEB patient encouraged to continue CBT  Patient/Guardian was advised Release of Information must be obtained prior to any record release in order to collaborate their care with an outside provider.  Patient/Guardian was advised if they have not already done so to contact the registration department to sign all necessary forms in order for [place] to release information regarding their care.   Consent: Patient/Guardian gives verbal consent for treatment and assignment of benefits for services provided during this visit. Patient/Guardian expressed understanding and agreed to proceed.   This note was generated in part or whole with voice recognition software. Voice recognition is usually quite accurate but there are transcription errors that can and very often do occur. I apologize for any typographical errors that were not detected and corrected.    Jomarie Longs, MD 03/22/2023, 8:23 AM

## 2023-04-16 ENCOUNTER — Ambulatory Visit: Payer: MEDICAID | Admitting: Nurse Practitioner

## 2023-04-16 NOTE — Progress Notes (Deleted)
 There were no vitals taken for this visit.   Subjective:    Patient ID: Joe Haley, male    DOB: [birth year], 33 y.o.   MRN: [id number]  HPI: Joe Haley is a 33 y.o. male  No chief complaint on file.  DEPRESSION  Patient recently saw Dr. Elna Breslow for psychiatry.  His Abilify was increased and Lexapro restarted.  Initially metformin was prescribed for weight management but insurance denied it.  Topamax was sent in to help mitigate weight gain from Abilify.  He is not able to see [facility] due to insurance but they facilitated him finding another option for IOP.  Satisfied with current treatment?: no Symptom severity: severe  Duration of current treatment : several weeks  Side effects: no Medication compliance: good compliance Psychotherapy/counseling: no  Previous psychiatric medications: lexapro  Depressed mood: yes Anxious mood: yes Anhedonia: yes Significant weight loss or gain: no Insomnia:  It's been pretty bad the past couple days -   hard to fall asleep Fatigue: yes Feelings of worthlessness or guilt: yes Impaired concentration/indecisiveness: yes Suicidal ideations: yes- reports thoughts that he would rather not be here but denies plans  He reports he still has safety plan and will reach out to mother for more severe thoughts  Hopelessness: yes Crying spells: no             11/02/2022    9:16 AM 10/19/2022   10:22 AM 10/03/2022    9:49 AM 09/19/2022    4:07 PM 09/05/2022    9:30 AM  Depression screen PHQ 2/9  Decreased Interest 2 2 3 3 2   Down, Depressed, Hopeless 2 2 2 3 2   PHQ - 2 Score 4 4 5 6 4   Altered sleeping 2 2 1 3 3   Tired, decreased energy 2 2 2 3 3   Change in appetite 2 2 2 3 3   Feeling bad or failure about yourself  2 3 2 3 3   Trouble concentrating 2 2 1 3 3   Moving slowly or fidgety/restless 1 2 1 3 3   Suicidal thoughts 2 1 1 3 2   PHQ-9 Score 17 18 15 27 24   Difficult doing work/chores Extremely dIfficult Extremely dIfficult  Extremely dIfficult Extremely dIfficult Very difficult        11/02/2022    9:17 AM 10/19/2022   10:22 AM 10/03/2022    9:50 AM 09/19/2022    4:07 PM  GAD 7 : Generalized Anxiety Score  Nervous, Anxious, on Edge 2 2 2 3   Control/stop worrying 2 1 2 3   Worry too much - different things 2 2 2 3   Trouble relaxing 2 2 2 3   Restless 1 1 1  0  Easily annoyed or irritable 0 1 1 0  Afraid - awful might happen 2 2 2 3   Total GAD 7 Score 11 11 12 15   Anxiety Difficulty Extremely difficult Extremely difficult Somewhat difficult Extremely difficult        Relevant past medical, surgical, family and social history reviewed and updated as indicated. Interim medical history since our last visit reviewed. Allergies and medications reviewed and updated.  Review of Systems  Constitutional:  Positive for unexpected weight change.  Psychiatric/Behavioral:  Positive for dysphoric mood and sleep disturbance. Negative for suicidal ideas. The patient is nervous/anxious.     Per HPI unless specifically indicated above     Objective:    There were no vitals taken for this visit.  Wt Readings from Last 3 Encounters:  01/16/23 (!) 408 lb (185.1 kg)  11/23/22 (!) 381 lb 12.8 oz (173.2 kg)  11/02/22 (!) 387 lb 12.8 oz (175.9 kg)    Physical Exam Vitals and nursing note reviewed.  Constitutional:      General: He is not in acute distress.    Appearance: Normal appearance. He is not ill-appearing, toxic-appearing or diaphoretic.  HENT:     Head: Normocephalic.     Right Ear: External ear normal.     Left Ear: External ear normal.     Nose: Nose normal. No congestion or rhinorrhea.     Mouth/Throat:     Mouth: Mucous membranes are moist.  Eyes:     General:        Right eye: No discharge.        Left eye: No discharge.     Extraocular Movements: Extraocular movements intact.     Conjunctiva/sclera: Conjunctivae normal.     Pupils: Pupils are equal, round, and reactive to light.  Cardiovascular:      Rate and Rhythm: Normal rate and regular rhythm.     Heart sounds: No murmur heard. Pulmonary:     Effort: Pulmonary effort is normal. No respiratory distress.     Breath sounds: Normal breath sounds. No wheezing, rhonchi or rales.  Abdominal:     General: Abdomen is flat. Bowel sounds are normal.  Musculoskeletal:     Cervical back: Normal range of motion and neck supple.  Skin:    General: Skin is warm and dry.     Capillary Refill: Capillary refill takes less than 2 seconds.  Neurological:     General: No focal deficit present.     Mental Status: He is alert and oriented to person, place, and time.  Psychiatric:        Mood and Affect: Mood normal.        Behavior: Behavior normal.        Thought Content: Thought content normal.        Judgment: Judgment normal.     Results for orders placed or performed in visit on 01/16/23  CBC w/Diff   Collection Time: 01/16/23 11:39 AM  Result Value Ref Range   WBC 11.1 (H) 3.4 - 10.8 x10E3/uL   RBC 5.45 4.14 - 5.80 x10E6/uL   Hemoglobin 16.0 13.0 - 17.7 g/dL   Hematocrit 69.6 29.5 - 51.0 %   MCV 88 79 - 97 fL   MCH 29.4 26.6 - 33.0 pg   MCHC 33.4 31.5 - 35.7 g/dL   RDW 28.4 13.2 - 44.0 %   Platelets 333 150 - 450 x10E3/uL   Neutrophils 64 Not Estab. %   Lymphs 24 Not Estab. %   Monocytes 7 Not Estab. %   Eos 3 Not Estab. %   Basos 1 Not Estab. %   Neutrophils Absolute 7.1 (H) 1.4 - 7.0 x10E3/uL   Lymphocytes Absolute 2.6 0.7 - 3.1 x10E3/uL   Monocytes Absolute 0.8 0.1 - 0.9 x10E3/uL   EOS (ABSOLUTE) 0.4 0.0 - 0.4 x10E3/uL   Basophils Absolute 0.1 0.0 - 0.2 x10E3/uL   Immature Granulocytes 1 Not Estab. %   Immature Grans (Abs) 0.1 0.0 - 0.1 x10E3/uL  Creatinine   Collection Time: 01/16/23 11:39 AM  Result Value Ref Range   Creatinine, Ser 0.84 0.76 - 1.27 mg/dL   eGFR 102 >72 ZD/GUY/4.03  Hepatic function panel   Collection Time: 01/16/23 11:39 AM  Result Value Ref Range   Total Protein 7.1 6.0 -  8.5 g/dL   Albumin  4.7 4.1 - 5.1 g/dL   Bilirubin Total 0.7 0.0 - 1.2 mg/dL   Bilirubin, Direct 4.09 0.00 - 0.40 mg/dL   Alkaline Phosphatase 83 44 - 121 IU/L   AST 21 0 - 40 IU/L   ALT 26 0 - 44 IU/L  HgB A1c   Collection Time: 01/16/23 11:39 AM  Result Value Ref Range   Hgb A1c MFr Bld 5.5 4.8 - 5.6 %   Est. average glucose Bld gHb Est-mCnc 111 mg/dL  Prolactin   Collection Time: 01/16/23 11:39 AM  Result Value Ref Range   Prolactin 14.7 3.9 - 22.7 ng/mL  Lipid Profile   Collection Time: 01/16/23 11:39 AM  Result Value Ref Range   Cholesterol, Total 144 100 - 199 mg/dL   Triglycerides 811 0 - 149 mg/dL   HDL 41 >91 mg/dL   VLDL Cholesterol Cal 26 5 - 40 mg/dL   LDL Chol Calc (NIH) 77 0 - 99 mg/dL   Chol/HDL Ratio 3.5 0.0 - 5.0 ratio      Assessment & Plan:   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     Follow up plan: No follow-ups on file.

## 2023-04-19 ENCOUNTER — Ambulatory Visit (INDEPENDENT_AMBULATORY_CARE_PROVIDER_SITE_OTHER): Payer: MEDICAID | Admitting: Professional Counselor

## 2023-04-19 DIAGNOSIS — F332 Major depressive disorder, recurrent severe without psychotic features: Secondary | ICD-10-CM

## 2023-04-19 NOTE — Progress Notes (Signed)
  THERAPIST PROGRESS NOTE  Session Time: 9:00 AM - 9:37 AM   Participation Level: Active  Behavioral Response: Casual, Alert, Dysphoric  Type of Therapy: Individual Therapy  Treatment Goals addressed: Active OP Depression  LTG: "I just want to be more motivated. Have a better outlook. I'd like to be in a better head space so I can start working again and be better health wise.                 Start:  02/15/23    Expected End:  02/14/24      STG: Increase coping skills to manage depression and improve ability to perform daily activities    STG: Improve thinking patterns AEB identifying and restructuring cognitive distortions over the next 12 weeks.     STG: Reduce unhealthy coping mechanisms such as stress eating by implementing positive replacement behaviors and other emotion regulation skills over the next 12 weeks   ProgressTowards Goals: Progressing  Interventions: CBT, Motivational Interviewing, and Supportive  Summary: Joe Haley is a 33 y.o. male who presents with a history of anxiety and depression. He appeared alert and oriented x5. He stated things are going okay. He has been out hiking with a friend and enjoys being in nature and getting exercise. He also went to a reveal show for his card game and won a $400 card. Joe Haley reported his sister moved back in and that's been nice so far. He noted he is still struggling with depression and his weight. He was receptive to coping skills and engaged in creating affirmations to help change his mindset. He took handout on additional coping skills for depression (behavior activation, social support, mindfulness.)   Therapist Response: Conducted session with Joe Haley. Began session with check-in/update since previous session. Utilized empathetic and reflective listening. Reviewed treatment plan to help identify plan for session. Explained chunking to help with productivity. Assisted with creating affirmations to change negative mindset.  Provided additional handout on coping skills for depression. Scheduled additional appointment and concluded session.   Suicidal/Homicidal: No  Plan: Return again in 2 weeks.  Diagnosis: Severe recurrent major depression without psychotic features (HCC)  Collaboration of Care: Medication Management AEB chart review  Patient/Guardian was advised Release of Information must be obtained prior to any record release in order to collaborate their care with an outside provider. Patient/Guardian was advised if they have not already done so to contact the registration department to sign all necessary forms in order for [place] to release information regarding their care.   Consent: Patient/Guardian gives verbal consent for treatment and assignment of benefits for services provided during this visit. Patient/Guardian expressed understanding and agreed to proceed.   Edmonia Lynch, [facility] 04/19/2023

## 2023-04-20 ENCOUNTER — Telehealth: Payer: Self-pay

## 2023-04-20 NOTE — Telephone Encounter (Signed)
 recieved fax that a prior auth is needed for vraylar.

## 2023-04-20 NOTE — Telephone Encounter (Signed)
 went online to [email or web address] and submitted the prior auth . - pending

## 2023-04-23 NOTE — Telephone Encounter (Signed)
 received fax that vraylar was approved from 04-20-23 to 04-19-24

## 2023-04-30 ENCOUNTER — Telehealth: Payer: Self-pay

## 2023-04-30 ENCOUNTER — Encounter: Payer: Self-pay | Admitting: Nurse Practitioner

## 2023-04-30 ENCOUNTER — Ambulatory Visit (INDEPENDENT_AMBULATORY_CARE_PROVIDER_SITE_OTHER): Payer: MEDICAID | Admitting: Nurse Practitioner

## 2023-04-30 VITALS — BP 116/71 | HR 73 | Temp 97.9°F | Resp 16 | Ht 70.98 in | Wt >= 6400 oz

## 2023-04-30 DIAGNOSIS — F332 Major depressive disorder, recurrent severe without psychotic features: Secondary | ICD-10-CM

## 2023-04-30 DIAGNOSIS — F411 Generalized anxiety disorder: Secondary | ICD-10-CM

## 2023-04-30 MED ORDER — WEGOVY 0.25 MG/0.5ML ~~LOC~~ SOAJ: 0.2500 mg | SUBCUTANEOUS | 0 refills | Status: DC

## 2023-04-30 MED ORDER — WEGOVY 0.5 MG/0.5ML ~~LOC~~ SOAJ: 0.5000 mg | SUBCUTANEOUS | 2 refills | Status: DC

## 2023-04-30 NOTE — Progress Notes (Signed)
 BP 116/71 (BP Location: Left Arm, Patient Position: Sitting, Cuff Size: Large)   Pulse 73   Temp 97.9 F (36.6 C) (Oral)   Resp 16   Ht 5' 10.98" (1.803 m)   Wt (!) 430 lb 12.8 oz (195.4 kg)   SpO2 96%   BMI 60.11 kg/m    Subjective:    Patient ID: Joe Haley, male    DOB: 1990-08-31, 33 y.o.   MRN: [id number]  HPI: Joe Haley is a 33 y.o. male  Chief Complaint  Patient presents with   Depression    Feels like he is "coasting" right now.    Anxiety   Obesity    Wants to talk about Wegovy.    DEPRESSION  Patient recently saw Dr. Elna Breslow for psychiatry.  His Vraylar was increased and Lexapro restarted.  States feels like it is working than the [employer].  Topomax is not helping with his weight gain.  He would like to discuss Wegovy.   Satisfied with current treatment?: no Symptom severity: severe  Duration of current treatment : several weeks  Side effects: no Medication compliance: good compliance Psychotherapy/counseling: no  Previous psychiatric medications: lexapro  Depressed mood: yes Anxious mood: yes Anhedonia: yes Significant weight loss or gain: no Insomnia:  It's been pretty bad the past couple days -   hard to fall asleep Fatigue: yes Feelings of worthlessness or guilt: yes Impaired concentration/indecisiveness: yes Suicidal ideations: yes- reports thoughts that he would rather not be here but denies plans  He reports he still has safety plan and will reach out to mother for more severe thoughts  Hopelessness: yes Crying spells: no             11/02/2022    9:16 AM 10/19/2022   10:22 AM 10/03/2022    9:49 AM 09/19/2022    4:07 PM 09/05/2022    9:30 AM  Depression screen PHQ 2/9  Decreased Interest 2 2 3 3 2   Down, Depressed, Hopeless 2 2 2 3 2   PHQ - 2 Score 4 4 5 6 4   Altered sleeping 2 2 1 3 3   Tired, decreased energy 2 2 2 3 3   Change in appetite 2 2 2 3 3   Feeling bad or failure about yourself  2 3 2 3 3   Trouble concentrating 2  2 1 3 3   Moving slowly or fidgety/restless 1 2 1 3 3   Suicidal thoughts 2 1 1 3 2   PHQ-9 Score 17 18 15 27 24   Difficult doing work/chores Extremely dIfficult Extremely dIfficult Extremely dIfficult Extremely dIfficult Very difficult        11/02/2022    9:17 AM 10/19/2022   10:22 AM 10/03/2022    9:50 AM 09/19/2022    4:07 PM  GAD 7 : Generalized Anxiety Score  Nervous, Anxious, on Edge 2 2 2 3   Control/stop worrying 2 1 2 3   Worry too much - different things 2 2 2 3   Trouble relaxing 2 2 2 3   Restless 1 1 1  0  Easily annoyed or irritable 0 1 1 0  Afraid - awful might happen 2 2 2 3   Total GAD 7 Score 11 11 12 15   Anxiety Difficulty Extremely difficult Extremely difficult Somewhat difficult Extremely difficult    WEIGHT GAIN Duration: years Previous attempts at weight loss: yes- diet and exercise Complications of obesity: depression Peak weight: 430 lb Weight loss goal: 50lbs Weight loss to date: end of the year Requesting obesity  pharmacotherapy: yes Current weight loss supplements/medications: yes- topomax Previous weight loss supplements/meds: yes Calories:    Clinical coverage for weight loss GLP's   Medication being dispensed is Wegovy 3 mL/28 day. Titration doses are 2 mL/28 days.   []  Product being prescribed is FDA approved for the indication, age, weight (if applicable) and not does not exceed dosing limits per the Prescribing Information per the clinical conditions for use.  []  Patient's baseline weight measured within the last 45 days as required by provider before dispensing.  [x]  Patient is new to therapy and One of the following:   []  The beneficiary is 33 years of age or over and has ONE of the following:  [x]  A BMI greater than or equal to 30 kg/m2  []  A BMI greater than or equal to 27 kg/m2 with at least one weight-related comorbidity/risk factor/complication (i.e. hypertension, type 2 diabetes, obstructive sleep apnea, cardiovascular disease,  dyslipidemia)   If patient has one weight-related comorbidity/risk factor/complication (i.e. hypertension, type 2 diabetes, obstructive sleep apnea, cardiovascular disease, dyslipidemia), please list depression and anxiety    Last BMI/Weight/Height recorded Estimated body mass index is 60.11 kg/m as calculated from the following:   Height as of this encounter: 5' 10.98" (1.803 m).   Weight as of this encounter: 430 lb 12.8 oz (195.4 kg).     Relevant past medical, surgical, family and social history reviewed and updated as indicated. Interim medical history since our last visit reviewed. Allergies and medications reviewed and updated.  Review of Systems  Constitutional:  Positive for unexpected weight change.  Psychiatric/Behavioral:  Positive for dysphoric mood and sleep disturbance. Negative for suicidal ideas. The patient is nervous/anxious.     Per HPI unless specifically indicated above     Objective:    BP 116/71 (BP Location: Left Arm, Patient Position: Sitting, Cuff Size: Large)   Pulse 73   Temp 97.9 F (36.6 C) (Oral)   Resp 16   Ht 5' 10.98" (1.803 m)   Wt (!) 430 lb 12.8 oz (195.4 kg)   SpO2 96%   BMI 60.11 kg/m   Wt Readings from Last 3 Encounters:  04/30/23 (!) 430 lb 12.8 oz (195.4 kg)  01/16/23 (!) 408 lb (185.1 kg)  01/08/23 (!) 419 lb 3.2 oz (190.1 kg)    Physical Exam Vitals and nursing note reviewed.  Constitutional:      General: He is not in acute distress.    Appearance: Normal appearance. He is obese. He is not ill-appearing, toxic-appearing or diaphoretic.  HENT:     Head: Normocephalic.     Right Ear: External ear normal.     Left Ear: External ear normal.     Nose: Nose normal. No congestion or rhinorrhea.     Mouth/Throat:     Mouth: Mucous membranes are moist.  Eyes:     General:        Right eye: No discharge.        Left eye: No discharge.     Extraocular Movements: Extraocular movements intact.     Conjunctiva/sclera:  Conjunctivae normal.     Pupils: Pupils are equal, round, and reactive to light.  Cardiovascular:     Rate and Rhythm: Normal rate and regular rhythm.     Heart sounds: No murmur heard. Pulmonary:     Effort: Pulmonary effort is normal. No respiratory distress.     Breath sounds: Normal breath sounds. No wheezing, rhonchi or rales.  Abdominal:     General:  Abdomen is flat. Bowel sounds are normal.  Musculoskeletal:     Cervical back: Normal range of motion and neck supple.  Skin:    General: Skin is warm and dry.     Capillary Refill: Capillary refill takes less than 2 seconds.  Neurological:     General: No focal deficit present.     Mental Status: He is alert and oriented to person, place, and time.  Psychiatric:        Mood and Affect: Mood normal.        Behavior: Behavior normal.        Thought Content: Thought content normal.        Judgment: Judgment normal.     Results for orders placed or performed in visit on 01/16/23  CBC w/Diff   Collection Time: 01/16/23 11:39 AM  Result Value Ref Range   WBC 11.1 (H) 3.4 - 10.8 x10E3/uL   RBC 5.45 4.14 - 5.80 x10E6/uL   Hemoglobin 16.0 13.0 - 17.7 g/dL   Hematocrit 16.1 09.6 - 51.0 %   MCV 88 79 - 97 fL   MCH 29.4 26.6 - 33.0 pg   MCHC 33.4 31.5 - 35.7 g/dL   RDW 04.5 40.9 - 81.1 %   Platelets 333 150 - 450 x10E3/uL   Neutrophils 64 Not Estab. %   Lymphs 24 Not Estab. %   Monocytes 7 Not Estab. %   Eos 3 Not Estab. %   Basos 1 Not Estab. %   Neutrophils Absolute 7.1 (H) 1.4 - 7.0 x10E3/uL   Lymphocytes Absolute 2.6 0.7 - 3.1 x10E3/uL   Monocytes Absolute 0.8 0.1 - 0.9 x10E3/uL   EOS (ABSOLUTE) 0.4 0.0 - 0.4 x10E3/uL   Basophils Absolute 0.1 0.0 - 0.2 x10E3/uL   Immature Granulocytes 1 Not Estab. %   Immature Grans (Abs) 0.1 0.0 - 0.1 x10E3/uL  Creatinine   Collection Time: 01/16/23 11:39 AM  Result Value Ref Range   Creatinine, Ser 0.84 0.76 - 1.27 mg/dL   eGFR 914 >78 GN/FAO/1.30  Hepatic function panel    Collection Time: 01/16/23 11:39 AM  Result Value Ref Range   Total Protein 7.1 6.0 - 8.5 g/dL   Albumin 4.7 4.1 - 5.1 g/dL   Bilirubin Total 0.7 0.0 - 1.2 mg/dL   Bilirubin, Direct 8.65 0.00 - 0.40 mg/dL   Alkaline Phosphatase 83 44 - 121 IU/L   AST 21 0 - 40 IU/L   ALT 26 0 - 44 IU/L  HgB A1c   Collection Time: 01/16/23 11:39 AM  Result Value Ref Range   Hgb A1c MFr Bld 5.5 4.8 - 5.6 %   Est. 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  Not well controlled.  Patient has tried diet and exercise for weight loss without success.  Will start [facility] 0.25mg  weekly.  Will increase to [facility] 0.5mg  weekly after the first 4 weeks.  Discussed how to inject medication.  Discussed side effects and benefits of medication.  Follow up in 6 weeks.  Call sooner if concerns arise.         Relevant Medications   Semaglutide-Weight Management (WEGOVY) 0.25 MG/0.5ML SOAJ   Semaglutide-Weight Management (WEGOVY) 0.5 MG/0.5ML SOAJ     Follow up plan: Return in about 6 weeks (around 06/11/2023) for Weight Managment.

## 2023-04-30 NOTE — Assessment & Plan Note (Signed)
 Chronic.  Not well controlled.  Patient has tried diet and exercise for weight loss without success.  Will start [facility] 0.25mg  weekly.  Will increase to [facility] 0.5mg  weekly after the first 4 weeks.  Discussed how to inject medication.  Discussed side effects and benefits of medication.  Follow up in 6 weeks.  Call sooner if concerns arise.

## 2023-04-30 NOTE — Telephone Encounter (Signed)
 PA for [facility] initiated and submitted via Cover My Meds. Key: [id number]

## 2023-05-01 ENCOUNTER — Ambulatory Visit (INDEPENDENT_AMBULATORY_CARE_PROVIDER_SITE_OTHER): Payer: MEDICAID | Admitting: Professional Counselor

## 2023-05-01 DIAGNOSIS — F332 Major depressive disorder, recurrent severe without psychotic features: Secondary | ICD-10-CM | POA: Diagnosis not present

## 2023-05-01 NOTE — Progress Notes (Signed)
 THERAPIST PROGRESS NOTE  Session Time: 9:00 AM - 9:45 AM   Participation Level: Active  Behavioral Response: Casual, Drowsy, Depressed  Type of Therapy: Individual Therapy  Treatment Goals addressed: Active OP Depression  LTG: "I just want to be more motivated. Have a better outlook. I'd like to be in a better head space so I can start working again and be better health wise.                 Start:  02/15/23    Expected End:  02/14/24      STG: Increase coping skills to manage depression and improve ability to perform daily activities    STG: Improve thinking patterns AEB identifying and restructuring cognitive distortions over the next 12 weeks.     STG: Reduce unhealthy coping mechanisms such as stress eating by implementing positive replacement behaviors and other emotion regulation skills over the next 12 weeks   ProgressTowards Goals: Not Progressing  Interventions: CBT, Motivational Interviewing, and Supportive  Summary: Joe Haley is a 33 y.o. male who presents with  a history of anxiety and depression.  He appeared somber but oriented x5. He reported not much has happened since last session. He did hang out with his friend and help his sister clean/move her old place. Joe Haley reported his depression has been bad lately. He appeared ambivalent about opposite action. He reported thoughts of returning to the hospital. He denied current SI but fears it will randomly come back, as it has in the past. [name] engaged in creating a safety plan and took a copy for himself. We discussed options for IOP/PHP, which he declined at this time. Briefly discussed Joe Haley's fear of driving, weighed pros/cons of obtaining his license, and ways to practice driving.    Therapist Response: Conducted session with [employer]. Began session with check-in/update since previous session. Utilized empathetic and reflective listening. Explained "opposite action" for emotion regulation. Completed verbal risk  assessment. Created a safety plan with input from Joe Haley on warning signs, coping skills, and people for distraction and assistance. Provided additional resources. Discussed options for IOP/PHP. Briefly discussed fear of driving. Scheduled additional appointment and concluded session.   Suicidal/Homicidal: No, Reports fears of becoming suicidal again, Noted it happens out of the blue. Completed safety plan  Warning signs: Isolation Sleeping a lot Not talkative  Antisocial  Self-soothing skills: Play video games Listen to music Take a hot shower Sitting outside Take a walk  People/Places for distraction: Sherri Rad out with [employer] Go to my one piece tournaments Hang around my family Go play with the doggies  People to ask for assistance: Drusilla Kanner  Professional assistance: [place] - Therapist Janina Mayo - [phone] Mobile crisis - [facility] [phone] [facility] - [address], [place], [place] [address] [facility] - [address] [place], [place] [address] [facility] Warm Line [phone] or 911 Laurena Bering crisis [phone]  Prevention plan: Give my mother my medicine 2.   Laurena Bering member support [phone]  Plan: Return again in 2 weeks.  Diagnosis: Severe recurrent major depression without psychotic features (HCC)  Collaboration of Care: Medication Management AEB chart review  Patient/Guardian was advised Release of Information must be obtained prior to any record release in order to collaborate their care with an outside provider. Patient/Guardian was advised if they have not already done so to contact the registration department to sign all necessary forms in order for [place] to release information regarding their care.   Consent: Patient/Guardian  gives verbal consent for treatment and assignment of benefits for services provided during this visit. Patient/Guardian expressed understanding and agreed to proceed.   Edmonia Lynch, [facility] 05/01/2023

## 2023-05-01 NOTE — Telephone Encounter (Signed)
 PA approved. Called and notified patient of approval.

## 2023-05-03 ENCOUNTER — Ambulatory Visit (INDEPENDENT_AMBULATORY_CARE_PROVIDER_SITE_OTHER): Payer: MEDICAID | Admitting: Psychiatry

## 2023-05-03 ENCOUNTER — Encounter: Payer: Self-pay | Admitting: Psychiatry

## 2023-05-03 VITALS — BP 118/82 | HR 93 | Temp 97.9°F | Ht 70.98 in | Wt >= 6400 oz

## 2023-05-03 DIAGNOSIS — R635 Abnormal weight gain: Secondary | ICD-10-CM | POA: Diagnosis not present

## 2023-05-03 DIAGNOSIS — T50905A Adverse effect of unspecified drugs, medicaments and biological substances, initial encounter: Secondary | ICD-10-CM

## 2023-05-03 DIAGNOSIS — Z634 Disappearance and death of family member: Secondary | ICD-10-CM

## 2023-05-03 DIAGNOSIS — F411 Generalized anxiety disorder: Secondary | ICD-10-CM | POA: Diagnosis not present

## 2023-05-03 DIAGNOSIS — F332 Major depressive disorder, recurrent severe without psychotic features: Secondary | ICD-10-CM | POA: Diagnosis not present

## 2023-05-03 MED ORDER — CARIPRAZINE HCL 3 MG PO CAPS: 3.0000 mg | ORAL_CAPSULE | Freq: Every day | ORAL | 1 refills | Status: DC

## 2023-05-03 MED ORDER — TOPIRAMATE 25 MG PO TABS: 25.0000 mg | ORAL_TABLET | Freq: Every day | ORAL | 1 refills | Status: DC

## 2023-05-03 MED ORDER — TRAZODONE HCL 50 MG PO TABS: 50.0000 mg | ORAL_TABLET | Freq: Every day | ORAL | 1 refills | Status: DC

## 2023-05-03 MED ORDER — ESCITALOPRAM OXALATE 20 MG PO TABS: 20.0000 mg | ORAL_TABLET | Freq: Every day | ORAL | 1 refills | Status: DC

## 2023-05-03 NOTE — Progress Notes (Signed)
 BH MD OP Progress Note  05/03/2023 10:06 AM Joe Haley  MRN:  [id number]  Chief Complaint:  Chief Complaint  Patient presents with   Follow-up   Depression   Anxiety   Medication Refill   HPI: Joe Haley is a 33 year old Caucasian male, unemployed, lives in [place] with his parents, has a history of MDD, GAD, bereavement, hypothyroidism, morbid obesity, hyperlipidemia was evaluated in office today for a follow-up.  He is currently on Vraylar, Lexapro and Topamax for depression. Leafy Kindle is taken daily at night along with other medications.  He has been attending therapy sessions with Miss Lester [place], focusing on coping strategies such as 'doing the opposite of what my depression tells me.  He describes persistent low energy and low motivation, with increased sleep and decreased physical activity. He has not been walking for about a week and expresses a lack of motivation, stating 'I don't really care enough to do it.' Initially, Vraylar felt effective, but its impact has since diminished.  He experiences difficulty with focus and concentration, managing only about 20 minutes of focus at a time.  He reports binge eating, particularly at dinner, consuming two to three plates instead of one. He often skips breakfast and has minimal lunch, leading to increased hunger and overeating at dinner.  He sleeps approximately five to six hours at night but also naps excessively during the day, which affects his nighttime sleep.  He has fleeting thoughts of being 'better off dead,' most recently a few days ago, triggered by a song related to his deceased brother.  Denies active suicidal ideation or plans.  Visit Diagnosis:    ICD-10-CM   1. Severe recurrent major depression without psychotic features (HCC)  F33.2 cariprazine (VRAYLAR) 3 MG capsule    escitalopram (LEXAPRO) 20 MG tablet    traZODone (DESYREL) 50 MG tablet    2. GAD (generalized anxiety disorder)  F41.1  escitalopram (LEXAPRO) 20 MG tablet    3. Bereavement  Z63.4     4. Weight gain due to medication  R63.5 topiramate (TOPAMAX) 25 MG tablet   T50.905A    due to antipsychotic medication      Past Psychiatric History: I have reviewed past psychiatric history from progress note on 01/08/2023.  Patient with history of suicide attempts x 2 in the past.  Past Medical History:  Past Medical History:  Diagnosis Date   Acanthosis nigricans    Hyperlipidemia    Hypothyroidism    Ibuprofen overdose    Major depressive disorder    Obesity    Restless leg syndrome    History reviewed. No pertinent surgical history.  Family Psychiatric History: I have reviewed family psychiatric history from progress note on 01/08/2023.  Family History:  Family History  Problem Relation Age of Onset   Depression Sister    Depression Maternal Grandmother    Diabetes Maternal Grandmother     Social History: I have reviewed social history from progress note on 01/08/2023. Social History   Socioeconomic History   Marital status: Single    Spouse name: Not on file   Number of children: Not on file   Years of education: Not on file   Highest education level: High school graduate  Occupational History   Not on file  Tobacco Use   Smoking status: Never   Smokeless tobacco: Never  Vaping Use   Vaping status: Never Used  Substance and Sexual Activity   Alcohol use: No   Drug use: Yes  Types: Marijuana    Comment: occasional   Sexual activity: Not Currently  Other Topics Concern   Not on file  Social History Narrative   Not on file   Social Drivers of Health   Financial Resource Strain: High Risk (01/29/2023)   Overall Financial Resource Strain (CARDIA)    Difficulty of Paying Living Expenses: Hard  Food Insecurity: No Food Insecurity (01/29/2023)   Hunger Vital Sign    Worried About Running Out of Food in the Last Year: Never true    Ran Out of Food in the Last Year: Never true   Transportation Needs: No Transportation Needs (01/29/2023)   PRAPARE - Administrator, Civil Service (Medical): No    Lack of Transportation (Non-Medical): No  Physical Activity: Inactive (01/29/2023)   Exercise Vital Sign    Days of Exercise per Week: 0 days    Minutes of Exercise per Session: 0 min  Stress: Stress Concern Present (01/29/2023)   [employer] of Occupational Health - Occupational Stress Questionnaire    Feeling of Stress : Very much  Social Connections: Socially Isolated (01/29/2023)   Social Connection and Isolation Panel [NHANES]    Frequency of Communication with Friends and Family: Once a week    Frequency of Social Gatherings with Friends and Family: Once a week    Attends Religious Services: Never    Database administrator or Organizations: No    Attends Engineer, structural: Never    Marital Status: Never married    Allergies: No Known Allergies  Metabolic Disorder Labs: Lab Results  Component Value Date   HGBA1C 5.5 01/16/2023   MPG 88 03/29/2016   Lab Results  Component Value Date   PROLACTIN 14.7 01/16/2023   PROLACTIN 77.0 (H) 03/29/2016   Lab Results  Component Value Date   CHOL 144 01/16/2023   TRIG 149 01/16/2023   HDL 41 01/16/2023   CHOLHDL 3.5 01/16/2023   VLDL 36 03/29/2016   LDLCALC 77 01/16/2023   LDLCALC 66 04/12/2021   Lab Results  Component Value Date   TSH 2.780 10/19/2022   TSH 0.718 10/31/2021    Therapeutic Level Labs: No results found for: "LITHIUM" No results found for: "VALPROATE" No results found for: "CBMZ"  Current Medications: Current Outpatient Medications  Medication Sig Dispense Refill   cariprazine (VRAYLAR) 3 MG capsule Take 1 capsule (3 mg total) by mouth daily. 30 capsule 1   levothyroxine (SYNTHROID) 200 MCG tablet Take 1 tablet (200 mcg total) by mouth daily. 90 tablet 0   escitalopram (LEXAPRO) 20 MG tablet Take 1 tablet (20 mg total) by mouth daily. 30 tablet 1    Semaglutide-Weight Management (WEGOVY) 0.25 MG/0.5ML SOAJ Inject 0.25 mg into the skin once a week. (Patient not taking: Reported on 05/03/2023) 2 mL 0   Semaglutide-Weight Management (WEGOVY) 0.5 MG/0.5ML SOAJ Inject 0.5 mg into the skin once a week. (Patient not taking: Reported on 05/03/2023) 2 mL 2   topiramate (TOPAMAX) 25 MG tablet Take 1 tablet (25 mg total) by mouth daily. 30 tablet 1   traZODone (DESYREL) 50 MG tablet Take 1 tablet (50 mg total) by mouth at bedtime. 30 tablet 1   No current facility-administered medications for this visit.     Musculoskeletal: Strength & Muscle Tone: within normal limits Gait & Station: normal Patient leans: N/A  Psychiatric Specialty Exam: Review of Systems  Psychiatric/Behavioral:  Positive for dysphoric mood and sleep disturbance. The patient is nervous/anxious.  Blood pressure 118/82, pulse 93, temperature 97.9 F (36.6 C), temperature source Temporal, height 5' 10.98" (1.803 m), weight (!) 427 lb 9.6 oz (194 kg), SpO2 96%.Body mass index is 59.67 kg/m.  General Appearance: Casual  Eye Contact:  Fair  Speech:  Clear and Coherent  Volume:  Normal  Mood:  Anxious and Depressed  Affect:  Congruent  Thought Process:  Goal Directed and Descriptions of Associations: Intact  Orientation:  Full (Time, Place, and Person)  Thought Content: Logical   Suicidal Thoughts:  No  Homicidal Thoughts:  No  Memory:  Immediate;   Fair Recent;   Fair Remote;   Fair  Judgement:  Fair  Insight:  Fair  Psychomotor Activity:  Normal  Concentration:  Concentration: Fair and Attention Span: Fair  Recall:  [employer] of Knowledge: Fair  Language: Fair  Akathisia:  No  Handed:  Right  AIMS (if indicated): done  Assets:  Desire for Improvement Housing Social Support Transportation  ADL's:  Intact  Cognition: WNL  Sleep:   Excessive   Screenings: AIMS    Flowsheet Row Office Visit from 05/03/2023 in [facility]  AIMS Total Score 0      AUDIT    Flowsheet Row Counselor from 01/29/2023 in [facility] Admission (Discharged) from 03/28/2016 in [facility]AL MEDICINE  Alcohol Use Disorder Identification Test Final Score (AUDIT) 3 1      GAD-7    Flowsheet Row Office Visit from 05/03/2023 in [facility] Office Visit from 04/30/2023 in [facility] Counselor from 01/29/2023 in [facility] Office Visit from 01/16/2023 in [facility] Office Visit from 01/08/2023 in [facility]  Total GAD-7 Score 8 8 18 12 18       PHQ2-9    Flowsheet Row Office Visit from 05/03/2023 in [facility] Office Visit from 04/30/2023 in [facility] Counselor from 01/29/2023 in [facility] Office Visit from 01/16/2023 in [facility] Office Visit from 01/08/2023 in [facility]  PHQ-2 Total Score 4 5 6 4 5   PHQ-9 Total Score 17 17 21 18 26       Flowsheet Row Office Visit from 05/03/2023 in [facility] Video Visit from 03/21/2023 in [facility] Counselor from 01/29/2023 in [facility]  C-SSRS RISK CATEGORY Moderate Risk Moderate Risk Moderate Risk        Assessment and Plan: Joe Haley is a 33 year old Caucasian male with history of depression, anxiety, multiple medical problems was evaluated in office today.  Discussed assessment and plan as noted below.  Major Depressive Disorder-unstable Persistent depressive symptoms include low energy, motivation, and excessive sleep, with difficulty concentrating for  more than 20 minutes. Initial improvement with Vraylar has diminished. Current medications include Vraylar, Lexapro, and Topamax. No suicidal ideation, but fleeting thoughts of being better off dead triggered by emotional memories related to his brother. Increasing Vraylar to 3 mg daily is anticipated to help with depressive symptoms. - Increase Vraylar to 3 mg daily - Continue Lexapro 20 mg daily - Continue Topamax 25 mg daily - Continue Trazodone 50 mg at bedtime - Continue therapy with Lester [place] - Communicate with Lester [place] to provide additional strategies - Encourage development  of coping strategies and engagement in activities that counter depressive urges - Encourage setting an alarm to limit nap duration to 30-40 minutes - Advise on improving sleep hygiene practices  Generalized anxiety disorder-unstable Currently continues to have anxiety symptoms related to multiple situational stressors. - Continue CBT - Continue Lexapro 20 mg daily - Vraylar increased as noted above.  Bereavement-unstable Recent exacerbation of grief reaction with worsening depression symptoms. - Agrees to continue psychotherapy sessions.  Will coordinate with therapist.  Weight gain-multifactorial including due to psychotropics-unstable Provided dietary education, discussed staying active, portion control. - Continue follow up with primary care provider, recently started on Wegovy. - Consider establishing care with a nutritionist or dietitian. - Continue Topamax 25 mg daily.  Will consider discontinuing this medication if good control on Wegovy.  Follow-up - Follow-up in clinic in 4 to 5 weeks or sooner if needed.  Collaboration of Care: Collaboration of Care: Referral or follow-up with counselor/therapist AEB patient encouraged to continue CBT, I have coordinated care with Ms. Pricilla Loveless.  Patient/Guardian was advised Release of Information must be obtained prior to any record release in order to  collaborate their care with an outside provider. Patient/Guardian was advised if they have not already done so to contact the registration department to sign all necessary forms in order for [place] to release information regarding their care.   Consent: Patient/Guardian gives verbal consent for treatment and assignment of benefits for services provided during this visit. Patient/Guardian expressed understanding and agreed to proceed.  This note was generated in part or whole with voice recognition software. Voice recognition is usually quite accurate but there are transcription errors that can and very often do occur. I apologize for any typographical errors that were not detected and corrected.     Jomarie Longs, MD 05/03/2023, 7:08 PM

## 2023-05-03 NOTE — Patient Instructions (Signed)
 Weight Control You will be given some ideas to help you manage your weight. To view the content, go to this web address: [email or web address]  This video will expire on: 01/10/2025. If you need access to this video following this date, please reach out to the healthcare provider who assigned it to you. This information is not intended to replace advice given to you by your health care provider. Make sure you discuss any questions you have with your health care provider. Elsevier Patient Education  2024 Elsevier Inc.MyPlate from [employer]  MyPlate is an outline of a general healthy diet based on the Dietary Guidelines for Americans, 2020-2025, from the U.S. Department of Agriculture Architect). It sets guidelines for how much food you should eat from each food group based on your age, sex, and level of physical activity. What are tips for following MyPlate? To follow MyPlate recommendations: Eat a wide variety of fruits and vegetables, grains, and protein foods. Serve smaller portions and eat less food throughout the day. Limit portion sizes to avoid overeating. Enjoy your food. Get at least 150 minutes of exercise every week. This is about 30 minutes each day, 5 or more days per week. It can be difficult to have every meal look like MyPlate. Think about MyPlate as eating guidelines for an entire day, rather than each individual meal. Fruits and vegetables Make one half of your plate fruits and vegetables. Eat many different colors of fruits and vegetables each day. For a 2,000-calorie daily food plan, eat: 2 cups of vegetables every day. 2 cups of fruit every day. 1 cup is equal to: 1 cup raw or cooked vegetables. 1 cup raw fruit. 1 medium-sized orange, apple, or banana. 1 cup 100% fruit or vegetable juice. 2 cups raw leafy greens, such as lettuce, spinach, or kale.  cup dried fruit. Grains One fourth of your plate should be grains. Make at least half of the grains you eat each  day whole grains. For a 2,000-calorie daily food plan, eat 6 oz of grains every day. 1 oz is equal to: 1 slice bread. 1 cup cereal.  cup cooked rice, cereal, or pasta. Protein One fourth of your plate should be protein. Eat a wide variety of protein foods, including meat, poultry, fish, eggs, beans, nuts, and tofu. For a 2,000-calorie daily food plan, eat 5 oz of protein every day. 1 oz is equal to: 1 oz meat, poultry, or fish.  cup cooked beans. 1 egg.  oz nuts or seeds. 1 Tbsp peanut butter. Dairy Drink fat-free or low-fat (1%) milk. Eat or drink dairy as a side to meals. For a 2,000-calorie daily food plan, eat or drink 3 cups of dairy every day. 1 cup is equal to: 1 cup milk, yogurt, cottage cheese, or soy milk (soy beverage). 2 oz processed cheese. 1 oz natural cheese. Fats, oils, salt, and sugars Only small amounts of oils are recommended. Avoid foods that are high in calories and low in nutritional value (empty calories), like foods high in fat or added sugars. Choose foods that are low in salt (sodium). Choose foods that have less than 140 milligrams (mg) of sodium per serving. Drink water instead of sugary drinks. Drink enough fluid to keep your urine pale yellow. Where to find support Work with your health care provider or a dietitian to develop a customized eating plan that is right for you. Download an app (mobile application) to help you track your daily food intake. Where to find more information USDA: ChooseMyPlate.gov  Summary MyPlate is a general guideline for healthy eating from the USDA. It is based on the Dietary Guidelines for Americans, 2020-2025. In general, fruits and vegetables should take up one half of your plate, grains should take up one fourth of your plate, and protein should take up one fourth of your plate. This information is not intended to replace advice given to you by your health care provider. Make sure you discuss any questions you have  with your health care provider. Document Revised: 12/08/2019 Document Reviewed: 12/08/2019 Elsevier Patient Education  2024 [employer].

## 2023-05-14 ENCOUNTER — Ambulatory Visit (INDEPENDENT_AMBULATORY_CARE_PROVIDER_SITE_OTHER): Payer: MEDICAID | Admitting: Professional Counselor

## 2023-05-14 DIAGNOSIS — F332 Major depressive disorder, recurrent severe without psychotic features: Secondary | ICD-10-CM

## 2023-05-14 NOTE — Progress Notes (Signed)
 THERAPIST PROGRESS NOTE  Virtual Visit via Video Note  I connected with Dreydon Mark Geno on 05/14/23 at  3:00 PM EDT by a video enabled telemedicine application and verified that I am speaking with the correct person using two identifiers.  Location: Patient: Home Provider: Office   I discussed the limitations of evaluation and management by telemedicine and the availability of in person appointments. The patient expressed understanding and agreed to proceed.  I discussed the assessment and treatment plan with the patient. The patient was provided an opportunity to ask questions and all were answered. The patient agreed with the plan and demonstrated an understanding of the instructions.   The patient was advised to call back or seek an in-person evaluation if the symptoms worsen or if the condition fails to improve as anticipated.  I provided 25 minutes of non-face-to-face time during this encounter. Len Quale, [facility]  Session Time: 3:01 PM - 3:26 PM  Participation Level: Active  Behavioral Response: Disheveled, Alert, Dysphoric  Type of Therapy: Individual Therapy  Treatment Goals addressed:  Active OP Depression  LTG: "I just want to be more motivated. Have a better outlook. I'd like to be in a better head space so I can start working again and be better health wise.                 Start:  02/15/23    Expected End:  02/14/24      STG: Increase coping skills to manage depression and improve ability to perform daily activities    STG: Improve thinking patterns AEB identifying and restructuring cognitive distortions over the next 12 weeks.     STG: Reduce unhealthy coping mechanisms such as stress eating by implementing positive replacement behaviors and other emotion regulation skills over the next 12 weeks   ProgressTowards Goals: Progressing  Interventions: CBT, Motivational Interviewing, and Supportive  Summary: Ashyr Mark Cortina is a 33 y.o. male who  presents with a history of anxiety and depression. He appeared somber but oriented x5. He stated he has been trying to use coping mechanisms and be more active. He was able to get out for some walking/hikes and attend a card tournament, which he won. [name] reported he is continuing to have thoughts of going to the hospital though. He was receptive to IOP referral and stated it might be helpful. [name] engaged in discussion about his binge eating and identifying thoughts around this behavior. He was receptive to check the facts and urge surfing to help make changes around this.   Therapist Response: Conducted session with [name]. Began session with check-in/update since previous session. Utilized empathetic and reflective listening. Processed [name]'s thoughts about hospitalization and again offered IOP referral. Answered questions about what to expect. Explored [name]'s thoughts around binge eating behavior. Used check the facts to help challenge thinking and ways to restructure negative thoughts contributing to this behavior. Discussed urge surfing to reduce unwanted behavior. Emailed copy of skill to [name] so he can practice. Confirmed next appointment and plan to email referral for IOP. Concluded session.   Suicidal/Homicidal: No,without intent/plan  Plan: Return again in 3 weeks.  Diagnosis: Severe recurrent major depression without psychotic features (HCC)  Collaboration of Care: Medication Management AEB chart review, Referral to IOP  Patient/Guardian was advised Release of Information must be obtained prior to any record release in order to collaborate their care with an outside provider. Patient/Guardian was advised if they have not already done so to contact the registration department to  sign all necessary forms in order for us  to release information regarding their care.   Consent: Patient/Guardian gives verbal consent for treatment and assignment of benefits for services provided during this visit.  Patient/Guardian expressed understanding and agreed to proceed.   Len Quale, [facility] 05/14/2023

## 2023-05-16 ENCOUNTER — Telehealth (HOSPITAL_COMMUNITY): Payer: Self-pay | Admitting: Licensed Clinical Social Worker

## 2023-05-22 ENCOUNTER — Ambulatory Visit (INDEPENDENT_AMBULATORY_CARE_PROVIDER_SITE_OTHER): Payer: MEDICAID | Admitting: Professional

## 2023-05-22 DIAGNOSIS — F333 Major depressive disorder, recurrent, severe with psychotic symptoms: Secondary | ICD-10-CM | POA: Diagnosis not present

## 2023-05-28 ENCOUNTER — Ambulatory Visit (HOSPITAL_COMMUNITY): Payer: MEDICAID

## 2023-05-28 DIAGNOSIS — F332 Major depressive disorder, recurrent severe without psychotic features: Secondary | ICD-10-CM | POA: Diagnosis not present

## 2023-05-28 MED ORDER — VENLAFAXINE HCL ER 75 MG PO CP24: 75.0000 mg | ORAL_CAPSULE | Freq: Every day | ORAL | 1 refills | Status: DC

## 2023-05-28 MED ORDER — VENLAFAXINE HCL ER 37.5 MG PO CP24: 37.5000 mg | ORAL_CAPSULE | Freq: Every day | ORAL | 0 refills | Status: DC

## 2023-05-28 NOTE — Psych (Signed)
 Virtual Visit via Video Note  I connected with Joe Haley on 05/22/23 at 10:00 AM EDT by a video enabled telemedicine application and verified that I am speaking with the correct person using two identifiers.  Location: Patient: Home Provider: Clinical Home Office   I discussed the limitations of evaluation and management by telemedicine and the availability of in person appointments. The patient expressed understanding and agreed to proceed.  Follow Up Instructions:    I discussed the assessment and treatment plan with the patient. The patient was provided an opportunity to ask questions and all were answered. The patient agreed with the plan and demonstrated an understanding of the instructions.   The patient was advised to call back or seek an in-person evaluation if the symptoms worsen or if the condition fails to improve as anticipated.  I provided 75 minutes of non-face-to-face time during this encounter.   Buford Carnes, [facility]     Comprehensive Clinical Assessment (CCA) Note  05/22/2023 Joe Haley [id number]  Chief Complaint:  Chief Complaint  Patient presents with   Depression   Other    Recent pSI   Visit Diagnosis: MDD    CCA Screening, Triage and Referral (STR)  Patient Reported Information How did you hear about us ? Other (Comment)  Referral name: therapist Josiephine Nightingale  Referral phone number: No data recorded  Whom do you see for routine medical problems? I don't have a doctor; Primary Care  Practice/Facility Name: [facility] [place]  Practice/Facility Phone Number: No data recorded Name of Contact: No data recorded Contact Number: No data recorded Contact Fax Number: No data recorded Prescriber Name: No data recorded Prescriber Address (if known): No data recorded  What Is the Reason for Your Visit/Call Today? depression, anxiety, thoughts of suicide  How Long Has This Been Causing You Problems? > than 6  months  What Do You Feel Would Help You the Most Today? Treatment for Depression or other mood problem   Have You Recently Been in Any Inpatient Treatment (Hospital/Detox/Crisis Center/28-Day Program)? No  Name/Location of Program/Hospital:[facility] Long Were You There? 6 days  When Were You Discharged? No data recorded  Have You Ever Received Services From [facility] Before? Yes  Who Do You See at [facility]? Dr. Tere Felts   Have You Recently Had Any Thoughts About Hurting Yourself? Yes (last Wed/Thurs- "Why am I even here? What's the point? I would be better off gone.")  Are You Planning to Commit Suicide/Harm Yourself At This time? No   Have you Recently Had Thoughts About Hurting Someone Marigene Shoulder? No  Explanation: No data recorded  Have You Used Any Alcohol or Drugs in the Past 24 Hours? Yes  How Long Ago Did You Use Drugs or Alcohol? No data recorded What Did You Use and How Much? weed; "a couple hits"   Do You Currently Have a Therapist/Psychiatrist? Yes  Name of Therapist/Psychiatrist: Sees Eappen and Josiephine Nightingale since December 2024   Have You Been Recently Discharged From Any Office Practice or Programs? No  Explanation of Discharge From Practice/Program: No data recorded    CCA Screening Triage Referral Assessment Type of Contact: Tele-Assessment  Is this Initial or Reassessment? Initial Assessment  Date Telepsych consult ordered in [facility]:  No data recorded Time Telepsych consult ordered in [facility]:  No data recorded  Patient Reported Information Reviewed? No data recorded Patient Left Without Being Seen? No data recorded Reason for Not Completing Assessment: No data recorded  Collateral Involvement: chart review   Does Patient Have a Automotive engineer Guardian? No data recorded Name and Contact of Legal Guardian: No data recorded If Minor and Not Living with Parent(s), Who has Custody? No data recorded Is CPS involved or ever been  involved? Never  Is APS involved or ever been involved? Never   Patient Determined To Be At Risk for Harm To Self or Others Based on Review of Patient Reported Information or Presenting Complaint? No  Method: No Plan  Availability of Means: No data recorded Intent: No data recorded Notification Required: No data recorded Additional Information for Danger to Others Potential: No data recorded Additional Comments for Danger to Others Potential: No data recorded Are There Guns or Other Weapons in Your Home? No  Types of Guns/Weapons: No data recorded Are These Weapons Safely Secured?                            No data recorded Who Could Verify You Are Able To Have These Secured: No data recorded Do You Have any Outstanding Charges, Pending Court Dates, Parole/Probation? No data recorded Contacted To Inform of Risk of Harm To Self or Others: No data recorded  Location of Assessment: Other (comment)   Does Patient Present under Involuntary Commitment? No  IVC Papers Initial File Date: No data recorded  [place] of Residence: [place]   Patient Currently Receiving the Following Services: Medication Management; Individual Therapy   Determination of Need: Routine (7 days)   Options For Referral: Partial Hospitalization     CCA Biopsychosocial Intake/Chief Complaint:  Joe Haley reports to PHP per therapist referral. Stressors include: 1) Work: "I don't have a job right now and that stresses me out. It makes me feel worthless." 2) Brother passed December 01, 2022 3) "How I feel about myself. I'm very hard on myself." 4) Family: "They've been getting along recently but they have a lot of turmoil between them. Yell and holler at each other, mainly my sister with my mom or dad." Tx Hx: Sees Eappen (psychiatry) and Josiephine Nightingale (therapy) since December 2024; Therapy "a while ago" before 33yo. Hospitalizations include August 2024 for attempted suicide via OD on Zyquil, 2016 for attempted  suicide via OD on pain killers, and 2015 due to thoughts of hurting himself. Attempts include 2016 OD on pain killers and August 2024 via OD on OTC sleeping meds. He reports diagnosis history as MDD. Reports he continues to have pSI a few times a week including "Why am I even here? What's the point? I would be better off gone." PF: family, nephews, friends. He endorses AH of a girl screaming sometimes since age 71yo. History of self-harm of hit/punch self in head "when I get mad or really upset or overwhelmed. He reports he has cut himself a few times, last time 2022. He reports family history includes Dad's brother completed suicide via throwing himself in a well before Joe Haley was born. Supports include Mom and Dad. He lives with Mom, Dad, and Sister, sometimes 8yo/11yo nephews. Medical diagnosis include hyperthyroidism.  Current Symptoms/Problems: recent pSI; increased depression and anxiety; decreased ADLS- hygiene, not cleaning; paralyzed decision making; grief; appetite increased; sleep: oversleeping; decreased motivation; decreased concentration; anhedonia; hopelessness/worthlessness; weight gain of 45lbs in last 6 months; increased tearfulness; AH of hearing a girl screaming sometimes; increased mood swings   Patient Reported Schizophrenia/Schizoaffective Diagnosis in Past: No   Strengths: motivation for treatment; has advocated for self in past  and trying to currently  Preferences: to feel better  Abilities: can attend and participate in treatment   Type of Services Patient Feels are Needed: PHP   Initial Clinical Notes/Concerns: No data recorded  Mental Health Symptoms Depression:  Change in energy/activity; Hopelessness; Weight gain/loss; Sleep (too much or little); Worthlessness; Fatigue; Tearfulness; Increase/decrease in appetite; Difficulty Concentrating   Duration of Depressive symptoms: Greater than two weeks   Mania:  None   Anxiety:   Difficulty concentrating; Irritability;  Worrying; Tension; Sleep; Fatigue   Psychosis:  Hallucinations ("I hear this girl screaming sometimes." Reports it started years ago but has been a month or so since last time. "It always happens when I go outside at night.")   Duration of Psychotic symptoms: Greater than six months   Trauma:  None (Codependency)   Obsessions:  None   Compulsions:  Repeated behaviors/mental acts (Reports eating is compulsive)   Inattention:  None   Hyperactivity/Impulsivity:  None   Oppositional/Defiant Behaviors:  None   Emotional Irregularity:  Unstable self-image; Chronic feelings of emptiness   Other Mood/Personality Symptoms:  No data recorded   Mental Status Exam Appearance and self-care  Stature:  Tall   Weight:  Obese   Clothing:  Casual   Grooming:  Normal   Cosmetic use:  None   Posture/gait:  Normal   Motor activity:  Slowed   Sensorium  Attention:  Normal   Concentration:  Normal   Orientation:  X5   Recall/memory:  Normal   Affect and Mood  Affect:  Flat   Mood:  Depressed   Relating  Eye contact:  Normal   Facial expression:  Responsive   Attitude toward examiner:  Cooperative   Thought and Language  Speech flow: Slow   Thought content:  Appropriate to Mood and Circumstances   Preoccupation:  None   Hallucinations:  Auditory (Reports hearing a woman scream occasionally)   Organization:  No data recorded  [employer] of Knowledge:  Average   Intelligence:  Average   Abstraction:  Normal   Judgement:  Fair   Dance movement psychotherapist:  Realistic   Insight:  Fair   Decision Making:  Normal; Paralyzed; Vacilates   Social Functioning  Social Maturity:  Isolates   Social Judgement:  Normal   Stress  Stressors:  Grief/losses; Work; Family conflict; Illness; Financial   Coping Ability:  Overwhelmed   Skill Deficits:  Self-care; Activities of daily living   Supports:  Family; Friends/Service system      Religion: Religion/Spirituality Are You A Religious Person?: No  Leisure/Recreation: Leisure / Recreation Do You Have Hobbies?: Yes Leisure and Hobbies: card games, video games, goes hikes  Exercise/Diet: Exercise/Diet Do You Exercise?: No Have You Gained or Lost A Significant Amount of Weight in the Past Six Months?: Yes-Gained Number of Pounds Gained: 45 Do You Follow a Special Diet?: No Do You Have Any Trouble Sleeping?: Yes Explanation of Sleeping Difficulties: over sleeping   CCA Employment/Education Employment/Work Situation: Employment / Work Situation Employment Situation: Unemployed Patient's Job has Been Impacted by Current Illness: Yes Describe how Patient's Job has Been Impacted: He reports he is unable to get a job and hold job due to mental and physical heatlh. What is the Longest Time Patient has Held a Job?: "About over a year." Where was the Patient Employed at that Time?: [employer] Has Patient ever Been in the [employer]?: No  Education: Education Is Patient Currently Attending School?: No Last Grade Completed: 12 Did  You Graduate From [employer]?: Yes Did You Attend College?: Yes What Type of College Degree Do you Have?: [employer] for 1/2 year Did You Attend Graduate School?: No Did You Have An Individualized Education Program (IIEP): No Did You Have Any Difficulty At School?: No Patient's Education Has Been Impacted by Current Illness: No   CCA Family/Childhood History Family and Relationship History: Family history Marital status: Single Are you sexually active?: Yes What is your sexual orientation?: Heterosexual Has your sexual activity been affected by drugs, alcohol, medication, or emotional stress?: "No." Does patient have children?: No  Childhood History:  Childhood History By whom was/is the patient raised?: Both parents Additional childhood history information: "I would say fairly good. They did their best. They were  poor growing up and had to work a lot so couldn't be around a lot." Description of patient's relationship with caregiver when they were a child: Mother - "Close." Father - "Close but I can't talk to him like I talk to my mom." Patient's description of current relationship with people who raised him/her: Mother and Father: "pretty close with my parents, espeically my mother." How were you disciplined when you got in trouble as a child/adolescent?: whooped with a belt Does patient have siblings?: Yes Number of Siblings: 2 Description of patient's current relationship with siblings: brother deceased; sister- ok Did patient suffer any verbal/emotional/physical/sexual abuse as a child?: No Did patient suffer from severe childhood neglect?: No Has patient ever been sexually abused/assaulted/raped as an adolescent or adult?: No Was the patient ever a victim of a crime or a disaster?: No Witnessed domestic violence?: No Has patient been affected by domestic violence as an adult?: No  Child/Adolescent Assessment:     CCA Substance Use Alcohol/Drug Use: Alcohol / Drug Use Pain Medications: See MAR Prescriptions: See MAR Over the Counter: See MAR History of alcohol / drug use?: Yes Substance #1 Name of Substance 1: Marijuana 1 - Age of First Use: 21 1 - Amount (size/oz): varies 1 - Frequency: "not very often; maybe like 1-2x a month" 1 - Duration: years 1 - Last Use / Amount: last night; a few hits 1 - Method of Aquiring: buys 1- Route of Use: oral                       ASAM's:  Six Dimensions of Multidimensional Assessment  Dimension 1:  Acute Intoxication and/or Withdrawal Potential:      Dimension 2:  Biomedical Conditions and Complications:      Dimension 3:  Emotional, Behavioral, or Cognitive Conditions and Complications:     Dimension 4:  Readiness to Change:     Dimension 5:  Relapse, Continued use, or Continued Problem Potential:     Dimension 6:  Recovery/Living  Environment:     ASAM Severity Score:    ASAM Recommended Level of Treatment:     Substance use Disorder (SUD)    Recommendations for Services/Supports/Treatments: Recommendations for Services/Supports/Treatments Recommendations For Services/Supports/Treatments: Partial Hospitalization  DSM5 Diagnoses: Patient Active Problem List   Diagnosis Date Noted   Severe episode of recurrent major depressive disorder, with psychotic features (HCC) 01/08/2023   Bereavement 01/08/2023   High risk medication use 01/08/2023   At risk for prolonged QT interval syndrome 01/08/2023   Weight gain due to medication 01/08/2023   Suicidal ideations 09/19/2022   Severe recurrent major depression without psychotic features (HCC) 03/28/2016   Ibuprofen  overdose 03/28/2016   GAD (generalized anxiety disorder) 06/24/2014  Acquired hypothyroidism 04/02/2014   Hyperlipidemia 08/12/2013   RLS (restless legs syndrome) 08/11/2013   Acanthosis nigricans 07/02/2013   Morbid obesity (HCC) 07/02/2013    Patient Centered Plan: Patient is on the following Treatment Plan(s):  Depression   Referrals to Alternative Service(s): Referred to Alternative Service(s):   Place:   Date:   Time:    Referred to Alternative Service(s):   Place:   Date:   Time:    Referred to Alternative Service(s):   Place:   Date:   Time:    Referred to Alternative Service(s):   Place:   Date:   Time:      Collaboration of Care: Other referral from therapist  Patient/Guardian was advised Release of Information must be obtained prior to any record release in order to collaborate their care with an outside provider. Patient/Guardian was advised if they have not already done so to contact the registration department to sign all necessary forms in order for us  to release information regarding their care.   Consent: Patient/Guardian gives verbal consent for treatment and assignment of benefits for services provided during this visit.  Patient/Guardian expressed understanding and agreed to proceed.   Buford Carnes, [facility]

## 2023-05-28 NOTE — Progress Notes (Signed)
 Psychiatric Initial Adult Assessment   Virtual Visit via Video Note   I connected with Joe Haley on 05/28/2023,  9:00 AM EDT by a video enabled telemedicine application and verified that I am speaking with the correct person using two identifiers.   Location: Patient: Home Provider: Clinic   I discussed the limitations of evaluation and management by telemedicine and the availability of in person appointments. The patient expressed understanding and agreed to proceed.   Follow Up Instructions:   I discussed the assessment and treatment plan with the patient. The patient was provided an opportunity to ask questions and all were answered. The patient agreed with the plan and demonstrated an understanding of the instructions.   The patient was advised to call back or seek an in-person evaluation if the symptoms worsen or if the condition fails to improve as anticipated.  Marilou Showman, MD PGY-3   Patient Identification: Joe Haley MRN:  [id number] Date of Evaluation:  05/28/2023 Referral Source: Therapist Chief Complaint:  initial evaluation  Visit Diagnosis:    ICD-10-CM   1. Severe recurrent major depression without psychotic features (HCC)  F33.2 venlafaxine XR (EFFEXOR-XR) 37.5 MG 24 hr capsule    venlafaxine XR (EFFEXOR XR) 75 MG 24 hr capsule      History of Present Illness:  The patient is a 33 year old male with a past psychiatric history of major depressive disorder.  He follows with an outpatient psychiatrist and therapist in the [facility] health system in [place].  He is currently enrolled in the partial hospitalization program.  The patient reports experiencing longstanding depression.  He is noted to be unkempt during the video interview.  He reports that he spends most of his time sleeping or talking with his friend.  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  He talks about the death of his brother  in 12-10-23 and how difficult this was for him.  He has had 2 significant suicide attempts, the most recent of which occurred in August 2024.  He reports passive thoughts of not wanting to be alive.  He denies having plans or intentions of harming himself.  The patient reports generalized anxiety.  Screening for bipolar affective disorder, psychotic spectrum illness, and PTSD is negative.  Past Psychiatric History:  Most recent psychiatric hospitalization in August 2024 for a suicide attempt.  Diagnosis was major depressive disorder.  Another suicide attempt in August 2018.  Patient denies access to firearms.  Substance Use History: Patient denies previous or current difficulties with alcohol and drugs  Past Medical History: Hypothyroidism, morbid obesity  Family Psychiatric History:  None pertinent  Social History:  Lives with his parents and relatives.  Not currently working.  No spouse or significant other.  Previous Psychotropic Medications: yes  Substance Abuse History in the last 12 months:  no  Consequences of Substance Abuse: NA  Past Medical History:  Past Medical History:  Diagnosis Date   Acanthosis nigricans    Hyperlipidemia    Hypothyroidism    Ibuprofen  overdose    Major depressive disorder    Obesity    Restless leg syndrome    No past surgical history on file.  Family History:  Family History  Problem Relation Age of Onset   Depression Sister    Depression Maternal Grandmother    Diabetes Maternal Grandmother     Social History:   Social History   Socioeconomic History   Marital status: Single  Spouse name: Not on file   Number of children: Not on file   Years of education: Not on file   Highest education level: High school graduate  Occupational History   Not on file  Tobacco Use   Smoking status: Never   Smokeless tobacco: Never  Vaping Use   Vaping status: Never Used  Substance and Sexual Activity   Alcohol use: No   Drug use: Yes     Types: Marijuana    Comment: occasional   Sexual activity: Not Currently  Other Topics Concern   Not on file  Social History Narrative   Not on file   Social Drivers of Health   Financial Resource Strain: High Risk (01/29/2023)   Overall Financial Resource Strain (CARDIA)    Difficulty of Paying Living Expenses: Hard  Food Insecurity: No Food Insecurity (01/29/2023)   Hunger Vital Sign    Worried About Running Out of Food in the Last Year: Never true    Ran Out of Food in the Last Year: Never true  Transportation Needs: No Transportation Needs (01/29/2023)   PRAPARE - Administrator, Civil Service (Medical): No    Lack of Transportation (Non-Medical): No  Physical Activity: Inactive (01/29/2023)   Exercise Vital Sign    Days of Exercise per Week: 0 days    Minutes of Exercise per Session: 0 min  Stress: Stress Concern Present (01/29/2023)   [employer] of Occupational Health - Occupational Stress Questionnaire    Feeling of Stress : Very much  Social Connections: Socially Isolated (01/29/2023)   Social Connection and Isolation Panel [NHANES]    Frequency of Communication with Friends and Family: Once a week    Frequency of Social Gatherings with Friends and Family: Once a week    Attends Religious Services: Never    Database administrator or Organizations: No    Attends Engineer, structural: Never    Marital Status: Never married   Allergies:  No Known Allergies  Metabolic Disorder Labs: Lab Results  Component Value Date   HGBA1C 5.5 01/16/2023   MPG 88 03/29/2016   Lab Results  Component Value Date   PROLACTIN 14.7 01/16/2023   PROLACTIN 77.0 (H) 03/29/2016   Lab Results  Component Value Date   CHOL 144 01/16/2023   TRIG 149 01/16/2023   HDL 41 01/16/2023   CHOLHDL 3.5 01/16/2023   VLDL 36 03/29/2016   LDLCALC 77 01/16/2023   LDLCALC 66 04/12/2021   Lab Results  Component Value Date   TSH 2.780 10/19/2022    Therapeutic  Level Labs: No results found for: "LITHIUM" No results found for: "CBMZ" No results found for: "VALPROATE"  Current Medications: Current Outpatient Medications  Medication Sig Dispense Refill   [START ON 06/05/2023] venlafaxine XR (EFFEXOR XR) 75 MG 24 hr capsule Take 1 capsule (75 mg total) by mouth daily with breakfast. Begin taking after completion of 37.5 mg dose. 30 capsule 1   venlafaxine XR (EFFEXOR-XR) 37.5 MG 24 hr capsule Take 1 capsule (37.5 mg total) by mouth daily with breakfast for 7 days. 7 capsule 0   cariprazine  (VRAYLAR ) 3 MG capsule Take 1 capsule (3 mg total) by mouth daily. 30 capsule 1   levothyroxine  (SYNTHROID ) 200 MCG tablet Take 1 tablet (200 mcg total) by mouth daily. 90 tablet 0   Semaglutide -Weight Management (WEGOVY ) 0.25 MG/0.5ML SOAJ Inject 0.25 mg into the skin once a week. (Patient not taking: Reported on 05/03/2023) 2 mL 0  Semaglutide -Weight Management (WEGOVY ) 0.5 MG/0.5ML SOAJ Inject 0.5 mg into the skin once a week. (Patient not taking: Reported on 05/03/2023) 2 mL 2   topiramate  (TOPAMAX ) 25 MG tablet Take 1 tablet (25 mg total) by mouth daily. 30 tablet 1   traZODone  (DESYREL ) 50 MG tablet Take 1 tablet (50 mg total) by mouth at bedtime. 30 tablet 1   No current facility-administered medications for this visit.   VIRTUAL VISIT, LIMITED ASSESSMENT   Psychiatric Specialty Exam: Physical Exam Constitutional:      Appearance: the patient is not toxic-appearing.  Pulmonary:     Effort: Pulmonary effort is normal.  Neurological:     General: No focal deficit present.     Mental Status: the patient is alert and oriented to person, place, and time.   Review of Systems  Respiratory:  Negative for shortness of breath.   Cardiovascular:  Negative for chest pain.  Gastrointestinal:  Negative for abdominal pain, constipation, diarrhea, nausea and vomiting.  Neurological:  Negative for headaches.      No vital signs  General Appearance: Fairly Groomed   Eye Contact:  Good  Speech:  Clear and Coherent  Volume:  Normal  Mood: Depressed  Affect:  Congruent  Thought Process:  Coherent  Orientation:  Full (Time, Place, and Person)  Thought Content: Logical   Suicidal Thoughts:  No  Homicidal Thoughts:  No  Memory:  Immediate;   Good  Judgement:  fair  Insight:  fair  Psychomotor Activity:  Normal  Concentration:  Concentration: Good  Recall:  Good  Fund of Knowledge: Good  Language: Good  Akathisia:  No  Handed:  not assessed  AIMS (if indicated): not done  Assets:  Communication Skills Desire for Improvement Financial Resources/Insurance Housing Leisure Time Physical Health  ADL's:  Intact  Cognition: WNL        Screenings: AIMS    Flowsheet Row Office Visit from 05/03/2023 in [facility]  AIMS Total Score 0      AUDIT    Flowsheet Row Counselor from 01/29/2023 in [facility] Admission (Discharged) from 03/28/2016 in [facility]  Alcohol Use Disorder Identification Test Final Score (AUDIT) 3 1      GAD-7    Flowsheet Row Office Visit from 05/03/2023 in [facility] Office Visit from 04/30/2023 in [facility] Counselor from 01/29/2023 in [facility] Office Visit from 01/16/2023 in [facility] Office Visit from 01/08/2023 in [facility]  Total GAD-7 Score 8 8 18 12 18       PHQ2-9    Flowsheet Row Counselor from 05/22/2023 in [facility] Office Visit from 05/03/2023 in [facility] Office Visit from 04/30/2023 in [facility] Counselor from 01/29/2023 in [facility] Office Visit from 01/16/2023 in [facility]  PHQ-2 Total Score 2 4 5 6 4   PHQ-9 Total Score 15 17 17 21 18       Flowsheet Row Counselor from 05/22/2023 in [facility] Office Visit from 05/03/2023 in [facility] Video Visit from 03/21/2023 in [facility]  C-SSRS RISK CATEGORY Moderate Risk Moderate Risk Moderate Risk       Assessment and Plan:  Continue Vraylar  3 mg daily, patient  reports benefit from this medication Decrease Lexapro  20 mg daily to 10 mg daily for 1 week, then discontinue Start Effexor 37.5 mg daily for 1 week, then increase to 75 mg daily Patient does not have hypertension despite morbid obesity Continue trazodone  50 mg nightly Continue Topamax  25 mg daily for appetite suppression   Collaboration of Care: none  Patient/Guardian was advised Release of Information must be obtained prior to any record release in order to collaborate their care with an outside provider. Patient/Guardian was advised if they have not already done so to contact the registration department to sign all necessary forms in order for us  to release information regarding their care.   Consent: Patient/Guardian gives verbal consent for treatment and assignment of benefits for services provided during this visit. Patient/Guardian expressed understanding and agreed to proceed.   Marilou Showman, MD PGY-3

## 2023-05-29 ENCOUNTER — Ambulatory Visit (HOSPITAL_COMMUNITY): Payer: MEDICAID

## 2023-05-29 DIAGNOSIS — F332 Major depressive disorder, recurrent severe without psychotic features: Secondary | ICD-10-CM

## 2023-05-30 ENCOUNTER — Encounter (HOSPITAL_COMMUNITY): Payer: Self-pay

## 2023-05-30 ENCOUNTER — Ambulatory Visit (HOSPITAL_COMMUNITY): Payer: MEDICAID | Admitting: Licensed Clinical Social Worker

## 2023-05-30 DIAGNOSIS — F332 Major depressive disorder, recurrent severe without psychotic features: Secondary | ICD-10-CM | POA: Diagnosis not present

## 2023-05-30 NOTE — Psych (Signed)
 Virtual Visit via Video Note  I connected with Joe Haley on 05/30/23 at  9:00 AM EDT by a video enabled telemedicine application and verified that I am speaking with the correct person using two identifiers.  Location: Patient: pt's home in [place], [place] Provider: clinical office in [place], [place]   I discussed the limitations of evaluation and management by telemedicine and the availability of in person appointments. The patient expressed understanding and agreed to proceed.  I discussed the assessment and treatment plan with the patient. The patient was provided an opportunity to ask questions and all were answered. The patient agreed with the plan and demonstrated an understanding of the instructions.   The patient was advised to call back or seek an in-person evaluation if the symptoms worsen or if the condition fails to improve as anticipated.  I provided 240 minutes of non-face-to-face time during this encounter.   Wilfredo Hanly    [facility] PHP THERAPIST PROGRESS NOTE  Joe Haley [id number]   Session Time: 9:00 am - 10:00 am  Participation Level: Active  Behavioral Response: CasualAlertDepressed and flat  Type of Therapy: Group Therapy  Treatment Goals addressed: Coping  Progress Towards Goals: Progressing  Interventions: CBT, DBT, Solution Focused, Strength-based, Supportive, and Reframing  Therapist Response: Clinician led check-in regarding current stressors and situation, and review of patient completed daily inventory. Clinician utilized active listening and empathetic response and validated patient emotions. Clinician facilitated processing group on pertinent issues.?   Summary: Patient arrived within time allowed. Patient rates his mood at a 5 on a scale of 1-10 with 10 being best. Pt reported, "Just kind of here, coasting." He reports he spent time with a friend after group yesterday. He states his appetite has been better, eating twice  per day instead of only once per day. He reports he has been oversleeping, but last night his sleep was "patchy". He denies SI/SH thoughts and denies a topic for processing. Pt able to process.?Pt engaged in discussion.?      Session Time: 10:00 am - 11:00 am  Participation Level: Active  Behavioral Response: CasualAlertDepressed and flat  Type of Therapy: Group Therapy  Treatment Goals addressed: Coping  Progress Towards Goals: Progressing  Interventions: CBT, DBT, Solution Focused, Strength-based, Supportive, and Reframing  Therapist Response: Clinician led group on avoidance and acceptance of painful emotions and past experiences. Clinician utilized ACT principles to inform discussion.   Summary: Pt engaged in discussion. Pt reports he has struggled with accepting his brother's death and shares that he passed last November. He is receptive to feedback.    Session Time: 11:00 am - 12:00 pm  Participation Level: Active  Behavioral Response: CasualAlertDepressed and flat  Type of Therapy: Group Therapy  Treatment Goals addressed: Coping  Progress Towards Goals: Progressing  Interventions: CBT, DBT, Solution Focused, Strength-based, Supportive, and Reframing  Therapist Response: Group was led by chaplain, Randal Bury.  Summary: Pt engaged in discussion.   Session Time: 12:00 pm - 1:00 pm  Participation Level: Active  Behavioral Response: CasualAlertDepressed and flat  Type of Therapy: Group Therapy  Treatment Goals addressed: Coping  Progress Towards Goals: Progressing  Interventions: CBT, DBT, Solution Focused, Strength-based, Supportive, and Reframing  Therapist Response: 12:00 - 12:50 pm: Group was led by occupational therapist, Rockne Chyle. 12:50 - 1:00 pm: Clinician led check-out. Clinician assessed for immediate needs, medication compliance and efficacy, and safety concerns?  Summary: 12:00 - 12:50 pm: Pt engaged and participated in discussion.  12:50 -  1:00 pm: At check-out, patient contracts for safety.?Patient demonstrates progress as evidenced by his continued engagement and by being receptive to treatment. Patient denies SI/HI/self-harm thoughts at the end of group and agrees to seek help should those thoughts/feelings occur.?   Suicidal/Homicidal: Nowithout intent/plan  Plan: ?Pt will continue in PHP and medication management while continuing to work on decreasing depression symptoms,?SI, and anxiety symptoms,?and increasing the ability to self manage symptoms.     Collaboration of Care: Other provider involved in patient's care AEB RN Armandina Bernard  Patient/Guardian was advised Release of Information must be obtained prior to any record release in order to collaborate their care with an outside provider. Patient/Guardian was advised if they have not already done so to contact the registration department to sign all necessary forms in order for us  to release information regarding their care.   Consent: Patient/Guardian gives verbal consent for treatment and assignment of benefits for services provided during this visit. Patient/Guardian expressed understanding and agreed to proceed.   Diagnosis: Severe recurrent major depression without psychotic features (HCC) [F33.2]    1. Severe recurrent major depression without psychotic features [facility])       Sheilda Deputy, LCSW 05/30/2023

## 2023-05-30 NOTE — Psych (Signed)
 Virtual Visit via Video Note  I connected with Cebastian Mark Cashman on 05/29/23 at  9:00 AM EDT by a video enabled telemedicine application and verified that I am speaking with the correct person using two identifiers.  Location: Patient: patient home Provider: clinical home office   I discussed the limitations of evaluation and management by telemedicine and the availability of in person appointments. The patient expressed understanding and agreed to proceed.  I discussed the assessment and treatment plan with the patient. The patient was provided an opportunity to ask questions and all were answered. The patient agreed with the plan and demonstrated an understanding of the instructions.   The patient was advised to call back or seek an in-person evaluation if the symptoms worsen or if the condition fails to improve as anticipated.  Pt was provided 240 minutes of non-face-to-face time during this encounter.   Ethelle Herb, LCSW   [facility] PHP THERAPIST PROGRESS NOTE  Triton Altice [id number]  Session Time: 9:00 - 10:00  Participation Level: Active  Behavioral Response: CasualAlertDepressed  Type of Therapy: Group Therapy  Treatment Goals addressed: Coping  Progress Towards Goals: Initial  Interventions: CBT, DBT, Supportive, and Reframing  Summary: Josimar Steinberger is a 33 y.o. male who presents with depression symptoms.  Clinician led check-in regarding current stressors and situation, and review of patient completed daily inventory. Clinician utilized active listening and empathetic response and validated patient emotions. Clinician facilitated processing group on pertinent issues.   Therapist Response: Patient arrived within time allowed. Patient rates his mood at a 5 on a scale of 1-10 with 10 being best. Pt states he feels "empty." Pt states he slept 10 hours and ate 2x. Pt reports he did yard work with his dad and played basketball with a friend. Pt reports  sleeping to avoid thinking. Pt able to process. Pt engaged in discussion.          Session Time: 10:00 am - 11:00 am   Participation Level: Active   Behavioral Response: CasualAlertDepressed   Type of Therapy: Group Therapy   Treatment Goals addressed: Coping   Progress Towards Goals: Progressing   Interventions: CBT, DBT, Solution Focused, Strength-based, Supportive, and Reframing   Summary: Cln led discussion on change. Group members shared ways in which they struggle with change and what makes it difficult. Cln brought in DBT radical acceptance and CBT thought challenging as a way to manage issues with change.    Therapist Response:  Pt engaged in discussion and is able to connect with topic.           Session Time: 11:00 -12:00   Participation Level: Active   Behavioral Response: CasualAlertDepressed   Type of Therapy: Group Therapy   Treatment Goals addressed: Coping   Progress Towards Goals: Progressing   Interventions: CBT, DBT, Solution Focused, Strength-based, Supportive, and Reframing   Summary: Cln led discussion on communicating our struggles with our support team. Cln introduced the concept of giving disclosures to the people close to us  regarding the way we act in specific situations or the way we process certain stimuli. Group members discussed ways to provide this "road map to our brain" and in what situations this may be helpful to them.   Therapist Response: Pt engaged in discussion and is able to determine situations in which providing a disclosure can be helpful and practiced doing so.       Session Time: 12:00 -1:00   Participation Level: Active   Behavioral Response:  CasualAlertDepressed   Type of Therapy: Group therapy   Treatment Goals addressed: Coping   Progress Towards Goals: Progressing   Interventions: OT group   Summary: 12:00 - 12:50: Occupational Therapy group with cln E. Hollan.  12:50 - 1:00 Clinician assessed for  immediate needs, medication compliance and efficacy, and safety concerns.   Therapist Response: 12:00 - 12:50: Pt participated 12:50 - 1:00 pm: At check-out, patient reports no immediate concerns. Patient demonstrates progress as evidenced by continued engagement and responsiveness to treatment. Patient denies SI/HI/self-harm thoughts at the end of group.   Suicidal/Homicidal: Nowithout intent/plan  Plan: Pt will continue in PHP while working to decrease depression symptoms, increase daily functioning, and increase ability to manage symptoms in a healthy manner.   Collaboration of Care: Medication Management AEB Laymon Priest, MD  Patient/Guardian was advised Release of Information must be obtained prior to any record release in order to collaborate their care with an outside provider. Patient/Guardian was advised if they have not already done so to contact the registration department to sign all necessary forms in order for us  to release information regarding their care.   Consent: Patient/Guardian gives verbal consent for treatment and assignment of benefits for services provided during this visit. Patient/Guardian expressed understanding and agreed to proceed.   Diagnosis: Severe recurrent major depression without psychotic features (HCC) [F33.2]    1. Severe recurrent major depression without psychotic features (HCC)       Ethelle Herb, LCSW

## 2023-05-30 NOTE — Psych (Signed)
 Virtual Visit via Video Note  I connected with Joe Haley on 05/28/23 at  9:00 AM EDT by a video enabled telemedicine application and verified that I am speaking with the correct person using two identifiers.  Location: Patient: patient home Provider: clinical home office   I discussed the limitations of evaluation and management by telemedicine and the availability of in person appointments. The patient expressed understanding and agreed to proceed.  I discussed the assessment and treatment plan with the patient. The patient was provided an opportunity to ask questions and all were answered. The patient agreed with the plan and demonstrated an understanding of the instructions.   The patient was advised to call back or seek an in-person evaluation if the symptoms worsen or if the condition fails to improve as anticipated.  Pt was provided 240 minutes of non-face-to-face time during this encounter.   Ethelle Herb, LCSW   [facility] PHP THERAPIST PROGRESS NOTE  Joe Haley [id number]  Session Time: 9:00 - 10:00  Participation Level: Active  Behavioral Response: CasualAlertDepressed  Type of Therapy: Group Therapy  Treatment Goals addressed: Coping  Progress Towards Goals: Initial  Interventions: CBT, DBT, Supportive, and Reframing  Summary: Joe Haley is a 33 y.o. male who presents with depression symptoms.  Clinician led check-in regarding current stressors and situation, and review of patient completed daily inventory. Clinician utilized active listening and empathetic response and validated patient emotions. Clinician facilitated processing group on pertinent issues.   Therapist Response: Patient arrived within time allowed. Patient rates his mood at a 5 on a scale of 1-10 with 10 being best. Pt states he feels "in the middle." Pt states he slept 8.5 hours and ate 2x. Pt reports being overwhelmed with grief from his brother's death in 12-31-2023. Pt  reports sleeping through the night and approximately 4 hours during the day. Pt reports his weekend went well and he spent time with a friend. Pt states he is okay when actively engaged, but is otherwise ruminating on loss. Pt identifies passive SI and denies plan/intent.  Pt able to process. Pt engaged in discussion.          Session Time: 10:00 am - 11:00 am   Participation Level: Active   Behavioral Response: CasualAlertDepressed   Type of Therapy: Group Therapy   Treatment Goals addressed: Coping   Progress Towards Goals: Progressing   Interventions: CBT, DBT, Solution Focused, Strength-based, Supportive, and Reframing   Summary: Cln led processing group for pt's current struggles. Group members shared stressors and provided support and feedback. Cln brought in topics of boundaries, healthy relationships, and unhealthy thought processes to inform discussion.    Therapist Response:  Pt able to process and provide support to group.          Session Time: 11:00 -12:00   Participation Level: Active   Behavioral Response: CasualAlertDepressed   Type of Therapy: Group Therapy   Treatment Goals addressed: Coping   Progress Towards Goals: Progressing   Interventions: CBT, DBT, Solution Focused, Strength-based, Supportive, and Reframing   Summary: Cln led discussion on personal standards and they way in which it impacts the way we view ourselves and our abilities. Group members discussed judgment, struggles, and barriers they experience in terms of personal standards. Cln brought in topics of balance, grace, and kindness. Cln proposed the "best friend test" as a way to calibrate whether we are viewing our situation with kindness or harshness.    Therapist Response: Pt engaged in discussion  and reports willingness to utilize the best friend test.      Session Time: 12:00 -1:00   Participation Level: Active   Behavioral Response: CasualAlertDepressed   Type of Therapy:  Group therapy   Treatment Goals addressed: Coping   Progress Towards Goals: Progressing   Interventions: OT group   Summary: 12:00 - 12:50: Occupational Therapy group with cln E. Hollan.  12:50 - 1:00 Clinician assessed for immediate needs, medication compliance and efficacy, and safety concerns.   Therapist Response: 12:00 - 12:50: Pt participated 12:50 - 1:00 pm: At check-out, patient reports no immediate concerns. Patient demonstrates progress as evidenced by participating in first group session. Patient denies SI/HI/self-harm thoughts at the end of group.   Suicidal/Homicidal: Nowithout intent/plan  Plan: Pt will continue in PHP while working to decrease depression symptoms, increase daily functioning, and increase ability to manage symptoms in a healthy manner.   Collaboration of Care: Medication Management AEB Laymon Priest, MD  Patient/Guardian was advised Release of Information must be obtained prior to any record release in order to collaborate their care with an outside provider. Patient/Guardian was advised if they have not already done so to contact the registration department to sign all necessary forms in order for us  to release information regarding their care.   Consent: Patient/Guardian gives verbal consent for treatment and assignment of benefits for services provided during this visit. Patient/Guardian expressed understanding and agreed to proceed.   Diagnosis: Severe recurrent major depression without psychotic features (HCC) [F33.2]    1. Severe recurrent major depression without psychotic features (HCC)       Ethelle Herb, LCSW

## 2023-05-31 ENCOUNTER — Ambulatory Visit (INDEPENDENT_AMBULATORY_CARE_PROVIDER_SITE_OTHER): Payer: MEDICAID | Admitting: Licensed Clinical Social Worker

## 2023-05-31 DIAGNOSIS — F333 Major depressive disorder, recurrent, severe with psychotic symptoms: Secondary | ICD-10-CM

## 2023-06-01 ENCOUNTER — Ambulatory Visit (INDEPENDENT_AMBULATORY_CARE_PROVIDER_SITE_OTHER): Payer: MEDICAID | Admitting: Licensed Clinical Social Worker

## 2023-06-01 DIAGNOSIS — F333 Major depressive disorder, recurrent, severe with psychotic symptoms: Secondary | ICD-10-CM

## 2023-06-01 DIAGNOSIS — F411 Generalized anxiety disorder: Secondary | ICD-10-CM

## 2023-06-04 ENCOUNTER — Ambulatory Visit (INDEPENDENT_AMBULATORY_CARE_PROVIDER_SITE_OTHER): Payer: MEDICAID | Admitting: Licensed Clinical Social Worker

## 2023-06-04 ENCOUNTER — Other Ambulatory Visit: Payer: Self-pay | Admitting: Nurse Practitioner

## 2023-06-04 DIAGNOSIS — F411 Generalized anxiety disorder: Secondary | ICD-10-CM

## 2023-06-04 DIAGNOSIS — F333 Major depressive disorder, recurrent, severe with psychotic symptoms: Secondary | ICD-10-CM

## 2023-06-04 NOTE — Progress Notes (Signed)
 BH MD/PA/NP OP Progress Note  Virtual Visit via Telephone Note  I connected with Nealy Mark Bagot on 06/04/23 at  9:00 AM EDT by a video enabled telemedicine application and verified that I am speaking with the correct person using two identifiers.  Location: Patient: Home Provider: Office   I discussed the limitations, risks, security and privacy concerns of performing an evaluation and management service by telephone and the availability of in person appointments. I also discussed with the patient that there may be a patient responsible charge related to this service. The patient expressed understanding and agreed to proceed.   I discussed the assessment and treatment plan with the patient. The patient was provided an opportunity to ask questions and all were answered. The patient agreed with the plan and demonstrated an understanding of the instructions.   The patient was advised to call back or seek an in-person evaluation if the symptoms worsen or if the condition fails to improve as anticipated.  Marilou Showman, MD PGY-3  Name: Martin Chauhan  MRN:  [id number]  Chief Complaint: medication management follow-up  HPI:  The patient is a 33 year old male with a past psychiatric history of major depressive disorder. He follows with an outpatient psychiatrist and therapist in the [facility] health system in [place]. He is currently enrolled in the partial hospitalization program.   He is seen today for routine follow up. He feels that group is going well, though he says he has not made any changes to his life based on what he has learned and talked about. He denies having any thoughts of self-harm. He reports that he has not picked up the new medication yet. We discussed the medication changes to make sure there is no confusion.    Past Psychiatric History:  Most recent psychiatric hospitalization in August 2024 for a suicide attempt.  Diagnosis was major depressive disorder.  Another  suicide attempt in August 2018.  Patient denies access to firearms.     Family Psychiatric History: none pertinent  Visit Diagnosis:  No diagnosis found.  Past Medical History:  Past Medical History:  Diagnosis Date   Acanthosis nigricans    Hyperlipidemia    Hypothyroidism    Ibuprofen  overdose    Major depressive disorder    Obesity    Restless leg syndrome     No past surgical history on file.  Family History:  Family History  Problem Relation Age of Onset   Depression Sister    Depression Maternal Grandmother    Diabetes Maternal Grandmother     Social History:  Social History   Socioeconomic History   Marital status: Single    Spouse name: Not on file   Number of children: Not on file   Years of education: Not on file   Highest education level: High school graduate  Occupational History   Not on file  Tobacco Use   Smoking status: Never   Smokeless tobacco: Never  Vaping Use   Vaping status: Never Used  Substance and Sexual Activity   Alcohol use: No   Drug use: Yes    Types: Marijuana    Comment: occasional   Sexual activity: Not Currently  Other Topics Concern   Not on file  Social History Narrative   Not on file   Social Drivers of Health   Financial Resource Strain: High Risk (01/29/2023)   Overall Financial Resource Strain (CARDIA)    Difficulty of Paying Living Expenses: Hard  Food Insecurity: No Food Insecurity (  01/29/2023)   Hunger Vital Sign    Worried About Running Out of Food in the Last Year: Never true    Ran Out of Food in the Last Year: Never true  Transportation Needs: No Transportation Needs (01/29/2023)   PRAPARE - Administrator, Civil Service (Medical): No    Lack of Transportation (Non-Medical): No  Physical Activity: Inactive (01/29/2023)   Exercise Vital Sign    Days of Exercise per Week: 0 days    Minutes of Exercise per Session: 0 min  Stress: Stress Concern Present (01/29/2023)   [employer] of  Occupational Health - Occupational Stress Questionnaire    Feeling of Stress : Very much  Social Connections: Socially Isolated (01/29/2023)   Social Connection and Isolation Panel [NHANES]    Frequency of Communication with Friends and Family: Once a week    Frequency of Social Gatherings with Friends and Family: Once a week    Attends Religious Services: Never    Database administrator or Organizations: No    Attends Engineer, structural: Never    Marital Status: Never married    Allergies:  No Known Allergies  Metabolic Disorder Labs: Lab Results  Component Value Date   HGBA1C 5.5 01/16/2023   MPG 88 03/29/2016   Lab Results  Component Value Date   PROLACTIN 14.7 01/16/2023   PROLACTIN 77.0 (H) 03/29/2016   Lab Results  Component Value Date   CHOL 144 01/16/2023   TRIG 149 01/16/2023   HDL 41 01/16/2023   CHOLHDL 3.5 01/16/2023   VLDL 36 03/29/2016   LDLCALC 77 01/16/2023   LDLCALC 66 04/12/2021   Lab Results  Component Value Date   TSH 2.780 10/19/2022   TSH 0.718 10/31/2021    Therapeutic Level Labs: No results found for: "LITHIUM" No results found for: "VALPROATE" No results found for: "CBMZ"  Current Medications: Current Outpatient Medications  Medication Sig Dispense Refill   cariprazine  (VRAYLAR ) 3 MG capsule Take 1 capsule (3 mg total) by mouth daily. 30 capsule 1   levothyroxine  (SYNTHROID ) 200 MCG tablet Take 1 tablet (200 mcg total) by mouth daily. 90 tablet 0   Semaglutide -Weight Management (WEGOVY ) 0.25 MG/0.5ML SOAJ Inject 0.25 mg into the skin once a week. (Patient not taking: Reported on 05/03/2023) 2 mL 0   Semaglutide -Weight Management (WEGOVY ) 0.5 MG/0.5ML SOAJ Inject 0.5 mg into the skin once a week. (Patient not taking: Reported on 05/03/2023) 2 mL 2   topiramate  (TOPAMAX ) 25 MG tablet Take 1 tablet (25 mg total) by mouth daily. 30 tablet 1   traZODone  (DESYREL ) 50 MG tablet Take 1 tablet (50 mg total) by mouth at bedtime. 30 tablet 1    [START ON 06/05/2023] venlafaxine  XR (EFFEXOR  XR) 75 MG 24 hr capsule Take 1 capsule (75 mg total) by mouth daily with breakfast. Begin taking after completion of 37.5 mg dose. 30 capsule 1   venlafaxine  XR (EFFEXOR -XR) 37.5 MG 24 hr capsule Take 1 capsule (37.5 mg total) by mouth daily with breakfast for 7 days. 7 capsule 0   No current facility-administered medications for this visit.   Psychiatric Specialty Exam:  Physical Exam Constitutional:      Appearance: the patient is not toxic-appearing.  Pulmonary:     Effort: Pulmonary effort is normal.  Neurological:     General: No focal deficit present.     Mental Status: the patient is alert and oriented to person, place, and time.   Review of Systems  Respiratory:  Negative for shortness of breath.   Cardiovascular:  Negative for chest pain.  Gastrointestinal:  Negative for abdominal pain, constipation, diarrhea, nausea and vomiting.  Neurological:  Negative for headaches.      There were no vitals taken for this visit.  General Appearance: Fairly Groomed  Eye Contact:  Good  Speech:  Clear and Coherent  Volume:  Normal  Mood:  Euthymic  Affect:  Congruent  Thought Process:  Coherent  Orientation:  Full (Time, Place, and Person)  Thought Content: Logical   Suicidal Thoughts:  No  Homicidal Thoughts:  No  Memory:  Immediate;   Good  Judgement:  fair  Insight:  fair  Psychomotor Activity:  Normal  Concentration:  Concentration: Good  Recall:  Good  Fund of Knowledge: Good  Language: Good  Akathisia:  No  Handed:  not assessed  AIMS (if indicated): not done  Assets:  Communication Skills Desire for Improvement Financial Resources/Insurance Housing Leisure Time Physical Health  ADL's:  Intact  Cognition: WNL         Screenings: AIMS    Flowsheet Row Office Visit from 05/03/2023 in [facility]  AIMS Total Score 0      AUDIT    Flowsheet Row Counselor from  01/29/2023 in [facility] Admission (Discharged) from [facility]/28/2016 in [facility]  Alcohol Use Disorder Identification Test Final Score (AUDIT) 3 1      GAD-7    Flowsheet Row Office Visit from 05/03/2023 in [facility] Office Visit from 04/30/2023 in [facility] Counselor from 01/29/2023 in [facility] Office Visit from 01/16/2023 in [facility] Office Visit from 01/08/2023 in [facility]  Total GAD-7 Score 8 8 18 12 18       PHQ2-9    Flowsheet Row Counselor from 05/22/2023 in [facility] Office Visit from 05/03/2023 in [facility] Office Visit from 04/30/2023 in [facility] Counselor from 01/29/2023 in [facility] Office Visit from 01/16/2023 in [facility]  PHQ-2 Total Score 2 4 5 6 4   PHQ-9 Total Score 15 17 17 21 18       Flowsheet Row Counselor from 05/22/2023 in [facility] Office Visit from 05/03/2023 in [facility] Video Visit from 03/21/2023 in [facility]  C-SSRS RISK CATEGORY Moderate Risk Moderate Risk Moderate Risk       Assessment and Plan:  Continue Vraylar  3 mg daily, patient reports benefit from this medication Decrease Lexapro  20 mg daily to 10 mg daily for 1 week, then discontinue Start Effexor  37.5 mg daily for 1 week, then increase to 75 mg daily Patient does not have hypertension despite morbid obesity Continue trazodone  50 mg nightly Continue Topamax  25 mg daily for appetite suppression      Collaboration of Care: none  Patient/Guardian was advised Release of Information  must be obtained prior to any record release in order to collaborate their care with an outside provider. Patient/Guardian was advised if they have not already done so to contact the registration department to sign all necessary forms in order for us  to release information regarding their care.   Consent: Patient/Guardian gives verbal consent for treatment and assignment of benefits for services provided during  this visit. Patient/Guardian expressed understanding and agreed to proceed.    Marilou Showman, MD PGY-3

## 2023-06-05 ENCOUNTER — Ambulatory Visit (INDEPENDENT_AMBULATORY_CARE_PROVIDER_SITE_OTHER): Payer: MEDICAID | Admitting: Licensed Clinical Social Worker

## 2023-06-05 DIAGNOSIS — F333 Major depressive disorder, recurrent, severe with psychotic symptoms: Secondary | ICD-10-CM

## 2023-06-05 DIAGNOSIS — F411 Generalized anxiety disorder: Secondary | ICD-10-CM

## 2023-06-06 ENCOUNTER — Encounter (HOSPITAL_COMMUNITY): Payer: Self-pay

## 2023-06-06 ENCOUNTER — Ambulatory Visit (HOSPITAL_COMMUNITY): Payer: MEDICAID

## 2023-06-06 NOTE — Telephone Encounter (Signed)
 Requested Prescriptions  Pending Prescriptions Disp Refills   WEGOVY  0.25 MG/0.5ML SOAJ [Pharmacy Med Name: Wegovy  0.25 MG/0.5ML Subcutaneous Solution Auto-injector] 4 mL 0    Sig: INJECT ONE SYRINGEFUL INTO THE SKIN ONCE WEEKLY     Endocrinology:  Diabetes - GLP-1 Receptor Agonists - semaglutide  Passed - 06/06/2023  9:55 AM      Passed - HBA1C in normal range and within 180 days    Hemoglobin A1C  Date Value Ref Range Status  01/10/2014 5.4 4.2 - 6.3 % Final    Comment:    The American Diabetes Association recommends that a primary goal of therapy should be <7% and that physicians should reevaluate the treatment regimen in patients with HbA1c values consistently >8%.    HB A1C (BAYER DCA - WAIVED)  Date Value Ref Range Status  07/26/2017 4.8 <7.0 % Final    Comment:                                          Diabetic Adult            <7.0                                       Healthy Adult        4.3 - 5.7                                                           (DCCT/NGSP) American Diabetes Association's Summary of Glycemic Recommendations for Adults with Diabetes: Hemoglobin A1c <7.0%. More stringent glycemic goals (A1c <6.0%) may further reduce complications at the cost of increased risk of hypoglycemia.    Hgb A1c MFr Bld  Date Value Ref Range Status  01/16/2023 5.5 4.8 - 5.6 % Final    Comment:             Prediabetes: 5.7 - 6.4          Diabetes: >6.4          Glycemic control for adults with diabetes: <7.0          Passed - Cr in normal range and within 360 days    Creatinine  Date Value Ref Range Status  01/09/2014 1.34 (H) 0.60 - 1.30 mg/dL Final   Creatinine, Ser  Date Value Ref Range Status  01/16/2023 0.84 0.76 - 1.27 mg/dL Final         Passed - Valid encounter within last 6 months    Recent Outpatient Visits           1 month ago Severe recurrent major depression without psychotic features [facility])   [facility] Aileen Alexanders, NP

## 2023-06-07 ENCOUNTER — Ambulatory Visit (HOSPITAL_COMMUNITY): Payer: MEDICAID | Admitting: Licensed Clinical Social Worker

## 2023-06-07 ENCOUNTER — Encounter (HOSPITAL_COMMUNITY): Payer: Self-pay

## 2023-06-07 ENCOUNTER — Ambulatory Visit: Payer: MEDICAID | Admitting: Professional Counselor

## 2023-06-07 DIAGNOSIS — F333 Major depressive disorder, recurrent, severe with psychotic symptoms: Secondary | ICD-10-CM | POA: Diagnosis not present

## 2023-06-07 DIAGNOSIS — F411 Generalized anxiety disorder: Secondary | ICD-10-CM

## 2023-06-08 ENCOUNTER — Ambulatory Visit (HOSPITAL_COMMUNITY): Payer: MEDICAID | Admitting: Licensed Clinical Social Worker

## 2023-06-08 DIAGNOSIS — F333 Major depressive disorder, recurrent, severe with psychotic symptoms: Secondary | ICD-10-CM

## 2023-06-08 DIAGNOSIS — F411 Generalized anxiety disorder: Secondary | ICD-10-CM

## 2023-06-11 ENCOUNTER — Ambulatory Visit (HOSPITAL_COMMUNITY): Payer: MEDICAID | Admitting: Licensed Clinical Social Worker

## 2023-06-11 DIAGNOSIS — F333 Major depressive disorder, recurrent, severe with psychotic symptoms: Secondary | ICD-10-CM | POA: Diagnosis not present

## 2023-06-11 DIAGNOSIS — F411 Generalized anxiety disorder: Secondary | ICD-10-CM

## 2023-06-12 ENCOUNTER — Ambulatory Visit (HOSPITAL_COMMUNITY): Payer: MEDICAID | Admitting: Licensed Clinical Social Worker

## 2023-06-12 DIAGNOSIS — F333 Major depressive disorder, recurrent, severe with psychotic symptoms: Secondary | ICD-10-CM

## 2023-06-12 DIAGNOSIS — F411 Generalized anxiety disorder: Secondary | ICD-10-CM

## 2023-06-13 ENCOUNTER — Ambulatory Visit (HOSPITAL_COMMUNITY): Payer: MEDICAID | Admitting: Licensed Clinical Social Worker

## 2023-06-13 DIAGNOSIS — F332 Major depressive disorder, recurrent severe without psychotic features: Secondary | ICD-10-CM

## 2023-06-13 DIAGNOSIS — F411 Generalized anxiety disorder: Secondary | ICD-10-CM

## 2023-06-13 NOTE — Progress Notes (Signed)
 Virtual Visit via Video Note  I connected with Joe Haley on 06/13/23 at  9:00 AM EDT by a video enabled telemedicine application and verified that I am speaking with the correct person using two identifiers.  Location: Patient: home Provider: Office   I discussed the limitations of evaluation and management by telemedicine and the availability of in person appointments. The patient expressed understanding and agreed to proceed.  I discussed the assessment and treatment plan with the patient. The patient was provided an opportunity to ask questions and all were answered. The patient agreed with the plan and demonstrated an understanding of the instructions.   The patient was advised to call back or seek an in-person evaluation if the symptoms worsen or if the condition fails to improve as anticipated.  I provided 10 minutes of non-face-to-face time during this encounter.   Levester Reagin, NP   [facility] Outpatient [facility]- [facility] Discharge Summary  Ranson Holthaus [id number]  Admission date:  Discharge date: 06/13/2023  Reason for admission: Per admission assessment note:" Huber Osha Kempner is a 33 year old Caucasian male, unemployed, lives in [place] with his parents, has a history of MDD, GAD, bereavement, hypothyroidism, morbid obesity, hyperlipidemia, was evaluated by telemedicine today for a follow-up appointment.  Progress in Program Toward Treatment Goals: Progressing patient attended and participated with daily group session with active and engaged participation.  Denying any suicidal or homicidal ideations at discharge.  He denied any medication refills.  States he is on multiple coping skills to help reframe his brain.  States learning " new positive ways to talk to the bedside of myself."  Reports he was recently started on Abilify  which he has been taking and tolerating well.  Currently prescribed Vraylar  and  trazodone .  Patient to keep outpatient follow-up appointment with psychiatry [facility].   Progress (rationale): Take all of you medications as prescribed by your mental healthcare provider.  Report any adverse effects and reactions from your medications to your outpatient provider promptly.  Do not engage in alcohol and or illegal drug use while on prescription medicines. Keep all scheduled appointments. This is to ensure that you are getting refills on time and to avoid any interruption in your medication.  If you are unable to keep an appointment call to reschedule.  Be sure to follow up with resources and follow ups given. In the event of worsening symptoms call the crisis hotline, 911, and or go to the nearest emergency department for appropriate evaluation and treatment of symptoms. Follow-up with your primary care provider for your medical issues, concerns and or health care needs.    Collaboration of Care: Medication Management AEB continue medications as indicated  Patient/Guardian was advised Release of Information must be obtained prior to any record release in order to collaborate their care with an outside provider. Patient/Guardian was advised if they have not already done so to contact the registration department to sign all necessary forms in order for us  to release information regarding their care.   Consent: Patient/Guardian gives verbal consent for treatment and assignment of benefits for services provided during this visit. Patient/Guardian expressed understanding and agreed to proceed.   Dan Dun NP 06/13/2023

## 2023-06-14 ENCOUNTER — Encounter (HOSPITAL_COMMUNITY): Payer: Self-pay

## 2023-06-14 ENCOUNTER — Ambulatory Visit (INDEPENDENT_AMBULATORY_CARE_PROVIDER_SITE_OTHER): Payer: MEDICAID | Admitting: Nurse Practitioner

## 2023-06-14 ENCOUNTER — Encounter: Payer: Self-pay | Admitting: Nurse Practitioner

## 2023-06-14 ENCOUNTER — Ambulatory Visit (HOSPITAL_COMMUNITY): Payer: MEDICAID

## 2023-06-14 NOTE — Assessment & Plan Note (Signed)
 Chronic.  Tolerating Wegovy  it well.  Continue with Wegovy  0.5mg  for the next month.  Referral place for Nutritionist.  Follow up in 1 month.  Call sooner if concerns arise.

## 2023-06-14 NOTE — Progress Notes (Signed)
 BP 116/75 (BP Location: Left Arm, Patient Position: Sitting, Cuff Size: Large)   Pulse 83   Temp 98.7 F (37.1 C) (Oral)   Resp 17   Ht 5' 4.02" (1.626 m)   Wt (!) 424 lb (192.3 kg)   SpO2 97%   BMI 72.74 kg/m    Subjective:    Patient ID: Joe Haley, male    DOB: December 11, 1990, 33 y.o.   MRN: [id number]  HPI: Joe Haley is a 33 y.o. male  Chief Complaint  Patient presents with   Obesity    Wegovy  5 doses so far. No side effects. Can tell he is eating less.    WEIGHT GAIN Patient is on Wegovy  0.5mg  weekly.  He has done 1 injection of the 0.5mg  dose.  He has lost 3lbs since last visit.  He is not having any side effects.   Duration: years Previous attempts at weight loss: yes- diet and exercise Complications of obesity: depression Peak weight: 430 lb Weight loss goal: 50lbs Weight loss to date: end of the year Requesting obesity pharmacotherapy: yes Current weight loss supplements/medications: yes- topomax Previous weight loss supplements/meds: yes Calories:    Relevant past medical, surgical, family and social history reviewed and updated as indicated. Interim medical history since our last visit reviewed. Allergies and medications reviewed and updated.  Review of Systems  All other systems reviewed and are negative.   Per HPI unless specifically indicated above     Objective:    BP 116/75 (BP Location: Left Arm, Patient Position: Sitting, Cuff Size: Large)   Pulse 83   Temp 98.7 F (37.1 C) (Oral)   Resp 17   Ht 5' 4.02" (1.626 m)   Wt (!) 424 lb (192.3 kg)   SpO2 97%   BMI 72.74 kg/m   Wt Readings from Last 3 Encounters:  06/14/23 (!) 424 lb (192.3 kg)  05/03/23 (!) 427 lb 9.6 oz (194 kg)  04/30/23 (!) 430 lb 12.8 oz (195.4 kg)    Physical Exam Vitals and nursing note reviewed.  Constitutional:      General: He is not in acute distress.    Appearance: Normal appearance. He is obese. He is not ill-appearing, toxic-appearing or  diaphoretic.  HENT:     Head: Normocephalic.     Right Ear: External ear normal.     Left Ear: External ear normal.     Nose: Nose normal. No congestion or rhinorrhea.     Mouth/Throat:     Mouth: Mucous membranes are moist.  Eyes:     General:        Right eye: No discharge.        Left eye: No discharge.     Extraocular Movements: Extraocular movements intact.     Conjunctiva/sclera: Conjunctivae normal.     Pupils: Pupils are equal, round, and reactive to light.  Cardiovascular:     Rate and Rhythm: Normal rate and regular rhythm.     Heart sounds: No murmur heard. Pulmonary:     Effort: Pulmonary effort is normal. No respiratory distress.     Breath sounds: Normal breath sounds. No wheezing, rhonchi or rales.  Abdominal:     General: Abdomen is flat. Bowel sounds are normal.  Musculoskeletal:     Cervical back: Normal range of motion and neck supple.  Skin:    General: Skin is warm and dry.     Capillary Refill: Capillary refill takes less than 2 seconds.  Neurological:  General: No focal deficit present.     Mental Status: He is alert and oriented to person, place, and time.  Psychiatric:        Mood and Affect: Mood normal.        Behavior: Behavior normal.        Thought Content: Thought content normal.        Judgment: Judgment normal.     Results for orders placed or performed in visit on 01/16/23  CBC w/Diff   Collection Time: 01/16/23 11:39 AM  Result Value Ref Range   WBC 11.1 (H) 3.4 - 10.8 x10E3/uL   RBC 5.45 4.14 - 5.80 x10E6/uL   Hemoglobin 16.0 13.0 - 17.7 g/dL   Hematocrit 60.4 54.0 - 51.0 %   MCV 88 79 - 97 fL   MCH 29.4 26.6 - 33.0 pg   MCHC 33.4 31.5 - 35.7 g/dL   RDW 98.1 19.1 - 47.8 %   Platelets 333 150 - 450 x10E3/uL   Neutrophils 64 Not Estab. %   Lymphs 24 Not Estab. %   Monocytes 7 Not Estab. %   Eos 3 Not Estab. %   Basos 1 Not Estab. %   Neutrophils Absolute 7.1 (H) 1.4 - 7.0 x10E3/uL   Lymphocytes Absolute 2.6 0.7 - 3.1  x10E3/uL   Monocytes Absolute 0.8 0.1 - 0.9 x10E3/uL   EOS (ABSOLUTE) 0.4 0.0 - 0.4 x10E3/uL   Basophils Absolute 0.1 0.0 - 0.2 x10E3/uL   Immature Granulocytes 1 Not Estab. %   Immature Grans (Abs) 0.1 0.0 - 0.1 x10E3/uL  Creatinine   Collection Time: 01/16/23 11:39 AM  Result Value Ref Range   Creatinine, Ser 0.84 0.76 - 1.27 mg/dL   eGFR 295 >62 ZH/YQM/5.78  Hepatic function panel   Collection Time: 01/16/23 11:39 AM  Result Value Ref Range   Total Protein 7.1 6.0 - 8.5 g/dL   Albumin 4.7 4.1 - 5.1 g/dL   Bilirubin Total 0.7 0.0 - 1.2 mg/dL   Bilirubin, Direct 4.69 0.00 - 0.40 mg/dL   Alkaline Phosphatase 83 44 - 121 IU/L   AST 21 0 - 40 IU/L   ALT 26 0 - 44 IU/L  HgB A1c   Collection Time: 01/16/23 11:39 AM  Result Value Ref Range   Hgb A1c MFr Bld 5.5 4.8 - 5.6 %   Est. average glucose Bld gHb Est-mCnc 111 mg/dL  Prolactin   Collection Time: 01/16/23 11:39 AM  Result Value Ref Range   Prolactin 14.7 3.9 - 22.7 ng/mL  Lipid Profile   Collection Time: 01/16/23 11:39 AM  Result Value Ref Range   Cholesterol, Total 144 100 - 199 mg/dL   Triglycerides 629 0 - 149 mg/dL   HDL 41 >52 mg/dL   VLDL Cholesterol Cal 26 5 - 40 mg/dL   LDL Chol Calc (NIH) 77 0 - 99 mg/dL   Chol/HDL Ratio 3.5 0.0 - 5.0 ratio      Assessment & Plan:   Problem List Items Addressed This Visit       Other   Morbid obesity (HCC) - Primary   Chronic.  Tolerating Wegovy  it well.  Continue with Wegovy  0.5mg  for the next month.  Referral place for Nutritionist.  Follow up in 1 month.  Call sooner if concerns arise.       Relevant Orders   Amb Referral to Nutrition and Diabetic Education     Follow up plan: Return in about 1 month (around 07/15/2023) for Weight Managment .

## 2023-06-15 ENCOUNTER — Ambulatory Visit (INDEPENDENT_AMBULATORY_CARE_PROVIDER_SITE_OTHER): Payer: MEDICAID | Admitting: Licensed Clinical Social Worker

## 2023-06-15 DIAGNOSIS — F332 Major depressive disorder, recurrent severe without psychotic features: Secondary | ICD-10-CM

## 2023-06-15 DIAGNOSIS — F411 Generalized anxiety disorder: Secondary | ICD-10-CM

## 2023-06-19 ENCOUNTER — Telehealth (HOSPITAL_COMMUNITY): Payer: Self-pay | Admitting: Licensed Clinical Social Worker

## 2023-06-21 ENCOUNTER — Ambulatory Visit (INDEPENDENT_AMBULATORY_CARE_PROVIDER_SITE_OTHER): Payer: MEDICAID | Admitting: Professional Counselor

## 2023-06-21 DIAGNOSIS — Z634 Disappearance and death of family member: Secondary | ICD-10-CM

## 2023-06-21 DIAGNOSIS — F331 Major depressive disorder, recurrent, moderate: Secondary | ICD-10-CM | POA: Diagnosis not present

## 2023-06-21 NOTE — Progress Notes (Signed)
  THERAPIST PROGRESS NOTE  Session Time: 8:00 AM - 8:53 AM   Participation Level: Active  Behavioral Response: Casual, Alert, Dysphoric  Type of Therapy: Individual Therapy  Treatment Goals addressed: Active OP Depression  LTG: "I just want to be more motivated. Have a better outlook. I'd like to be in a better head space so I can start working again and be better health wise.                 Start:  02/15/23    Expected End:  02/14/24      STG: Increase coping skills to manage depression and improve ability to perform daily activities    STG: Improve thinking patterns AEB identifying and restructuring cognitive distortions over the next 12 weeks.     STG: Reduce unhealthy coping mechanisms such as stress eating by implementing positive replacement behaviors and other emotion regulation skills over the next 12 weeks   ProgressTowards Goals: Progressing  Interventions: CBT, Motivational Interviewing, and Supportive  Summary: Winferd Mark Wildermuth is a 33 y.o. male who presents with a history of anxiety and depression. He appeared alert and oriented x5. He stated PHP was helpful and he has been feeling better. [name] reported he is still struggling with grief over his brother's death. He engaged in discussion about memories of his brother, things he remembers from the day his brother passed, and his funeral. Coral Der processed thoughts/feelings as well. He was receptive to writing a letter to help share things he would want to tell his brother. He was also receptive to writing impact statement and how stuck points may be affecting his grieving process. [name] was also given a resource for grief support group. He is looking forward to card tournaments this weekend.  Therapist Response: Conducted session with [name]. Began session with check-in/update since previous session. Utilized empathetic and reflective listening. Inquired about [name]'s engagement in [facility] and how he's been doing since his discharge.  Engaged in discussion about [name]'s brother, memories, and recollection of the initial passing/funeral. Explained exercise to help process thoughts/feelings (letter to brother/impact statement). Provided psychoeducation on stuck points. Printed resource information for grief support groups. Scheduled additional appointment and concluded session.   Suicidal/Homicidal: No  Plan: Return again in 3 weeks.  Diagnosis: Major depressive disorder, recurrent episode, moderate (HCC)  Bereavement  Collaboration of Care: Medication Management AEB chart review  Patient/Guardian was advised Release of Information must be obtained prior to any record release in order to collaborate their care with an outside provider. Patient/Guardian was advised if they have not already done so to contact the registration department to sign all necessary forms in order for us  to release information regarding their care.   Consent: Patient/Guardian gives verbal consent for treatment and assignment of benefits for services provided during this visit. Patient/Guardian expressed understanding and agreed to proceed.   Len Quale, [facility] 06/21/2023

## 2023-06-26 NOTE — Psych (Signed)
 Virtual Visit via Video Note  I connected with Joe Haley on 05/31/23 at  9:00 AM EDT by a video enabled telemedicine application and verified that I am speaking with the correct person using two identifiers.  Location: Patient: patient home Provider: clinical home office   I discussed the limitations of evaluation and management by telemedicine and the availability of in person appointments. The patient expressed understanding and agreed to proceed.  I discussed the assessment and treatment plan with the patient. The patient was provided an opportunity to ask questions and all were answered. The patient agreed with the plan and demonstrated an understanding of the instructions.   The patient was advised to call back or seek an in-person evaluation if the symptoms worsen or if the condition fails to improve as anticipated.  Pt was provided 240 minutes of non-face-to-face time during this encounter.   Ethelle Herb, LCSW   Joe Haley THERAPIST PROGRESS NOTE  Joe Haley [id number]  Session Time: 9:00 - 10:00  Participation Level: Active  Behavioral Response: CasualAlertDepressed  Type of Therapy: Group Therapy  Treatment Goals addressed: Coping  Progress Towards Goals: Initial  Interventions: CBT, DBT, Supportive, and Reframing  Summary: Joe Haley is a 33 y.o. male who presents with depression symptoms.  Clinician led check-in regarding current stressors and situation, and review of patient completed daily inventory. Clinician utilized active listening and empathetic response and validated patient emotions. Clinician facilitated processing group on pertinent issues.   Therapist Response: Patient arrived within time allowed. Patient rates his mood at a 5 on a scale of 1-10 with 10 being best. Pt states he feels "like I'm drifting." Pt states he slept 6 hours and ate 2x. Pt reports he dug a trench for his dad yesterday and it "wore me out." Pt states he  feels outside of his body and "numb pretty much." Pt shares struggling with rumination and it interfered with being able to fall asleep. Pt able to process. Pt engaged in discussion.          Session Time: 10:00 am - 11:00 am   Participation Level: Active   Behavioral Response: CasualAlertDepressed   Type of Therapy: Group Therapy   Treatment Goals addressed: Coping   Progress Towards Goals: Progressing   Interventions: CBT, DBT, Solution Focused, Strength-based, Supportive, and Reframing   Summary: Cln led discussion on rest. Cln discussed the need to rewrite social story of rest being earned or last on the list and assert that rest is productive. Group members discussed barriers to allowing themselves rest and the negative self-talk involved.  Cln worked with group to thought challenge and apply self-coaching strategies.   Therapist Response:  Pt engaged in discussion and reports struggle with allowing themselves rest.          Session Time: 11:00 -12:00   Participation Level: Active   Behavioral Response: CasualAlertDepressed   Type of Therapy: Group Therapy   Treatment Goals addressed: Coping   Progress Towards Goals: Progressing   Interventions: CBT, DBT, Solution Focused, Strength-based, Supportive, and Reframing   Summary: Cln led discussion on communication struggles. Group shared ways in which communication is difficult for them and what are typical barriers. Cln reminded pt's of assertiveness skills and "I" statements. Cln encouraged pt's to consider making notes for points they want to address in a discussion and/or script out what they want to say as a way to decrease anxiety and likeliness that they will get off topic.  Therapist Response: Pt engaged in discussion and reports willingness to apply communication strategies.      Session Time: 12:00 -1:00   Participation Level: Active   Behavioral Response: CasualAlertDepressed   Type of Therapy: Group  therapy   Treatment Goals addressed: Coping   Progress Towards Goals: Progressing   Interventions: OT group   Summary: 12:00 - 12:50: Occupational Therapy group with cln E. Hollan.  12:50 - 1:00 Clinician assessed for immediate needs, medication compliance and efficacy, and safety concerns.   Therapist Response: 12:00 - 12:50: Pt participated 12:50 - 1:00 pm: At check-out, patient reports no immediate concerns. Patient demonstrates progress as evidenced by continued engagement and responsiveness to treatment. Patient denies SI/HI/self-harm thoughts at the end of group.   Suicidal/Homicidal: Nowithout intent/plan  Plan: Pt will continue in [place] while working to decrease depression symptoms, increase daily functioning, and increase ability to manage symptoms in a healthy manner.   Collaboration of Care: Medication Management AEB Laymon Priest, MD  Patient/Guardian was advised Release of Information must be obtained prior to any record release in order to collaborate their care with an outside provider. Patient/Guardian was advised if they have not already done so to contact the registration department to sign all necessary forms in order for us  to release information regarding their care.   Consent: Patient/Guardian gives verbal consent for treatment and assignment of benefits for services provided during this visit. Patient/Guardian expressed understanding and agreed to proceed.   Diagnosis: Severe episode of recurrent major depressive disorder, with psychotic features (HCC) [F33.3]    1. Severe episode of recurrent major depressive disorder, with psychotic features (HCC)       Ethelle Herb, LCSW

## 2023-06-28 NOTE — Psych (Signed)
 Virtual Visit via Video Note  I connected with Joe Haley on 06/05/23 at  9:00 AM EDT by a video enabled telemedicine application and verified that I am speaking with the correct person using two identifiers.  Location: Patient: patient home Provider: clinical home office   I discussed the limitations of evaluation and management by telemedicine and the availability of in person appointments. The patient expressed understanding and agreed to proceed.  I discussed the assessment and treatment plan with the patient. The patient was provided an opportunity to ask questions and all were answered. The patient agreed with the plan and demonstrated an understanding of the instructions.   The patient was advised to call back or seek an in-person evaluation if the symptoms worsen or if the condition fails to improve as anticipated.  Pt was provided 240 minutes of non-face-to-face time during this encounter.   Ethelle Herb, LCSW   [facility] PHP THERAPIST PROGRESS NOTE  Joe Haley [id number]  Session Time: 9:00 - 10:00  Participation Level: Active  Behavioral Response: CasualAlertDepressed  Type of Therapy: Group Therapy  Treatment Goals addressed: Coping  Progress Towards Goals: Progressing  Interventions: CBT, DBT, Supportive, and Reframing  Summary: Joe Haley is a 33 y.o. male who presents with depression symptoms.  Clinician led check-in regarding current stressors and situation, and review of patient completed daily inventory. Clinician utilized active listening and empathetic response and validated patient emotions. Clinician facilitated processing group on pertinent issues.   Therapist Response: Patient arrived within time allowed. Patient rates his mood at a 5 on a scale of 1-10 with 10 being best. Pt states he feels "floating." Pt states he slept 10 broken hours and ate 1x. Pt reports he took a "treacherous" hike with his friend and it wore him out. Pt  states spending time with his friend is his best distraction currently and he is the one who is able to get pt out of the house. Pt reports night continues to be difficult for him. Pt able to process. Pt engaged in discussion.          Session Time: 10:00 am - 11:00 am   Participation Level: Active   Behavioral Response: CasualAlertDepressed   Type of Therapy: Group Therapy   Treatment Goals addressed: Coping   Progress Towards Goals: Progressing   Interventions: CBT, DBT, Solution Focused, Strength-based, Supportive, and Reframing   Summary: Cln introduced topic of boundaries. Cln discussed how boundaries inform our relationships and affect self-esteem and personal agency. Group discussed the three types of boundaries: rigid, porous, and healthy and when each type is most helpful/harmful.    Therapist Response:  Pt engaged in discussion and is able to identify each type in their life.              Session Time: 11:00 -12:00   Participation Level: Active   Behavioral Response: CasualAlertDepressed   Type of Therapy: Group Therapy   Treatment Goals addressed: Coping   Progress Towards Goals: Progressing   Interventions: CBT, DBT, Solution Focused, Strength-based, Supportive, and Reframing   Summary: Cln continued topic of boundaries. Cln discussed the different ways boundaries present: physical, emotional, intellectual, sexual, material, and time. Group talked about the ways in which each type presents for them and is a struggle.    Therapist Response: Pt engaged in discussion and identified struggles with each type.        Session Time: 12:00 -1:00   Participation Level: Active   Behavioral Response: CasualAlertDepressed  Type of Therapy: Group therapy   Treatment Goals addressed: Coping   Progress Towards Goals: Progressing   Interventions: OT group   Summary: 12:00 - 12:50: Occupational Therapy group with cln E. Hollan.  12:50 - 1:00 Clinician  assessed for immediate needs, medication compliance and efficacy, and safety concerns.   Therapist Response: 12:00 - 12:50: Pt participated 12:50 - 1:00 pm: At check-out, patient reports no immediate concerns. Patient demonstrates progress as evidenced by continued engagement and responsiveness to treatment. Patient denies SI/HI/self-harm thoughts at the end of group.   Suicidal/Homicidal: Nowithout intent/plan  Plan: Pt will continue in PHP while working to decrease depression symptoms, increase daily functioning, and increase ability to manage symptoms in a healthy manner.   Collaboration of Care: Medication Management AEB Laymon Priest, MD  Patient/Guardian was advised Release of Information must be obtained prior to any record release in order to collaborate their care with an outside provider. Patient/Guardian was advised if they have not already done so to contact the registration department to sign all necessary forms in order for us  to release information regarding their care.   Consent: Patient/Guardian gives verbal consent for treatment and assignment of benefits for services provided during this visit. Patient/Guardian expressed understanding and agreed to proceed.   Diagnosis: Severe episode of recurrent major depressive disorder, with psychotic features (HCC) [F33.3]    1. Severe episode of recurrent major depressive disorder, with psychotic features (HCC)   2. GAD (generalized anxiety disorder)       Ethelle Herb, LCSW

## 2023-06-28 NOTE — Psych (Signed)
 Virtual Visit via Video Note  I connected with Joe Haley on 06/01/23 at  9:00 AM EDT by a video enabled telemedicine application and verified that I am speaking with the correct person using two identifiers.  Location: Patient: patient home Provider: clinical home office   I discussed the limitations of evaluation and management by telemedicine and the availability of in person appointments. The patient expressed understanding and agreed to proceed.  I discussed the assessment and treatment plan with the patient. The patient was provided an opportunity to ask questions and all were answered. The patient agreed with the plan and demonstrated an understanding of the instructions.   The patient was advised to call back or seek an in-person evaluation if the symptoms worsen or if the condition fails to improve as anticipated.  Pt was provided 240 minutes of non-face-to-face time during this encounter.   Joe Herb, LCSW   [facility] PHP THERAPIST PROGRESS NOTE  Joe Haley [id number]  Session Time: 9:00 - 10:00  Participation Level: Active  Behavioral Response: CasualAlertDepressed  Type of Therapy: Group Therapy  Treatment Goals addressed: Coping  Progress Towards Goals: Initial  Interventions: CBT, DBT, Supportive, and Reframing  Summary: Joe Haley is a 33 y.o. male who presents with depression symptoms.  Clinician led check-in regarding current stressors and situation, and review of patient completed daily inventory. Clinician utilized active listening and empathetic response and validated patient emotions. Clinician facilitated processing group on pertinent issues.   Therapist Response: Patient arrived within time allowed. Patient rates his mood at a 5 on a scale of 1-10 with 10 being best. Pt states he feels "like I'm drifting." Pt states he slept 8 hours and ate 2x. Pt reports he took a nap, ate meals with his parents, and "drifted" other than that.  Pt states he is unsure of thoughts he is having but overall feels "heavy" and "sad" and "numb." Pt reports not feeling attached to his brain during these times. Pt able to process. Pt engaged in discussion.          Session Time: 10:00 am - 11:00 am   Participation Level: Active   Behavioral Response: CasualAlertDepressed   Type of Therapy: Group Therapy   Treatment Goals addressed: Coping   Progress Towards Goals: Progressing   Interventions: CBT, DBT, Solution Focused, Strength-based, Supportive, and Reframing   Summary: Cln led discussion on reverting to old behaviors. Group members discussed ways in which they feel they have reverted currently or in the past. Group members report fear of reverting to old behaviors and they struggle to manage that fear. Cln informed discussion with CBT thought challenging and DBT distress tolerance skills.    Therapist Response:  Pt engaged in discussion.            Session Time: 11:00 -12:00   Participation Level: Active   Behavioral Response: CasualAlertDepressed   Type of Therapy: Group Therapy   Treatment Goals addressed: Coping   Progress Towards Goals: Progressing   Interventions: CBT, DBT, Solution Focused, Strength-based, Supportive, and Reframing   Summary: Cln led discussion on ways to manage stressors and feelings over the weekend. Group members  brainstormed things to do over the weekend for multiple levels of energy, access, and moods. Cln reviewed crisis services should they be needed and provided pt's with the text crisis line, mobile crisis, national suicide hotline, [facility] 24/7 line, and information on [facility].     Therapist Response: Pt engaged in discussion and  is able to identify 3 ideas of what to do over the weekend to keep their mind engaged.       Session Time: 12:00 -1:00   Participation Level: Active   Behavioral Response: CasualAlertDepressed   Type of Therapy: Group therapy   Treatment Goals  addressed: Coping   Progress Towards Goals: Progressing   Interventions: OT group   Summary: 12:00 - 12:50: Occupational Therapy group with cln E. Hollan.  12:50 - 1:00 Clinician assessed for immediate needs, medication compliance and efficacy, and safety concerns.   Therapist Response: 12:00 - 12:50: Pt participated 12:50 - 1:00 pm: At check-out, patient reports no immediate concerns. Patient demonstrates progress as evidenced by continued engagement and responsiveness to treatment. Patient denies SI/HI/self-harm thoughts at the end of group.   Suicidal/Homicidal: Nowithout intent/plan  Plan: Pt will continue in PHP while working to decrease depression symptoms, increase daily functioning, and increase ability to manage symptoms in a healthy manner.   Collaboration of Care: Medication Management AEB Laymon Priest, MD  Patient/Guardian was advised Release of Information must be obtained prior to any record release in order to collaborate their care with an outside provider. Patient/Guardian was advised if they have not already done so to contact the registration department to sign all necessary forms in order for us  to release information regarding their care.   Consent: Patient/Guardian gives verbal consent for treatment and assignment of benefits for services provided during this visit. Patient/Guardian expressed understanding and agreed to proceed.   Diagnosis: Severe episode of recurrent major depressive disorder, with psychotic features (HCC) [F33.3]    1. Severe episode of recurrent major depressive disorder, with psychotic features (HCC)   2. GAD (generalized anxiety disorder)       Joe Herb, LCSW

## 2023-06-28 NOTE — Psych (Signed)
 Virtual Visit via Video Note  I connected with Greyden Mark Fray on 06/04/23 at  9:00 AM EDT by a video enabled telemedicine application and verified that I am speaking with the correct person using two identifiers.  Location: Patient: patient home Provider: clinical home office   I discussed the limitations of evaluation and management by telemedicine and the availability of in person appointments. The patient expressed understanding and agreed to proceed.  I discussed the assessment and treatment plan with the patient. The patient was provided an opportunity to ask questions and all were answered. The patient agreed with the plan and demonstrated an understanding of the instructions.   The patient was advised to call back or seek an in-person evaluation if the symptoms worsen or if the condition fails to improve as anticipated.  Pt was provided 240 minutes of non-face-to-face time during this encounter.   Ethelle Herb, LCSW   [facility] PHP THERAPIST PROGRESS NOTE  Paiton Fosco [id number]  Session Time: 9:00 - 10:00  Participation Level: Active  Behavioral Response: CasualAlertDepressed  Type of Therapy: Group Therapy  Treatment Goals addressed: Coping  Progress Towards Goals: Progressing  Interventions: CBT, DBT, Supportive, and Reframing  Summary: Jaxyn Rout is a 33 y.o. male who presents with depression symptoms.  Clinician led check-in regarding current stressors and situation, and review of patient completed daily inventory. Clinician utilized active listening and empathetic response and validated patient emotions. Clinician facilitated processing group on pertinent issues.   Therapist Response: Patient arrived within time allowed. Patient rates his mood at a 5 on a scale of 1-10 with 10 being best. Pt states he feels "like I'm drifting." Pt states he slept 11 broken hours and ate 2x. Pt reports he got a hair cut and being in public was difficult for him.  Pt states he did yard work for his dad and spent time with his friend. Pt states his mood was "mostly in the middle" and he is able to have lighter moments when with his friend, but his mood will fade randomly. Pt shares he listened to a song that makes him think of his brother on repeat and cried.  Pt able to process. Pt engaged in discussion.          Session Time: 10:00 am - 11:00 am   Participation Level: Active   Behavioral Response: CasualAlertDepressed   Type of Therapy: Group Therapy   Treatment Goals addressed: Coping   Progress Towards Goals: Progressing   Interventions: CBT, DBT, Solution Focused, Strength-based, Supportive, and Reframing   Summary: Cln led processing group for pt's current struggles. Group members shared stressors and provided support and feedback. Cln brought in topics of boundaries, healthy relationships, and unhealthy thought processes to inform discussion.    Therapist Response:  Pt able to process and provide support to group.              Session Time: 11:00 -12:00   Participation Level: Active   Behavioral Response: CasualAlertDepressed   Type of Therapy: Group Therapy   Treatment Goals addressed: Coping   Progress Towards Goals: Progressing   Interventions: CBT, DBT, Solution Focused, Strength-based, Supportive, and Reframing   Summary: Cln led discussion on grief, focusing on having to rewrite the future we had envisioned before loss. Group members were given space to share and process losses they are struggling with. Cln brought in topics of stages of grief, CBT thought reframing, and DBT radical acceptance.    Therapist Response: Pt engaged  in discussion and shares about the recent death of his brother and how it affects him.       Session Time: 12:00 -1:00   Participation Level: Active   Behavioral Response: CasualAlertDepressed   Type of Therapy: Group therapy   Treatment Goals addressed: Coping   Progress Towards  Goals: Progressing   Interventions: OT group   Summary: 12:00 - 12:50: Occupational Therapy group with cln E. Hollan.  12:50 - 1:00 Clinician assessed for immediate needs, medication compliance and efficacy, and safety concerns.   Therapist Response: 12:00 - 12:50: Pt participated 12:50 - 1:00 pm: At check-out, patient reports no immediate concerns. Patient demonstrates progress as evidenced by continued engagement and responsiveness to treatment. Patient denies SI/HI/self-harm thoughts at the end of group.   Suicidal/Homicidal: Nowithout intent/plan  Plan: Pt will continue in PHP while working to decrease depression symptoms, increase daily functioning, and increase ability to manage symptoms in a healthy manner.   Collaboration of Care: Medication Management AEB Laymon Priest, MD  Patient/Guardian was advised Release of Information must be obtained prior to any record release in order to collaborate their care with an outside provider. Patient/Guardian was advised if they have not already done so to contact the registration department to sign all necessary forms in order for us  to release information regarding their care.   Consent: Patient/Guardian gives verbal consent for treatment and assignment of benefits for services provided during this visit. Patient/Guardian expressed understanding and agreed to proceed.   Diagnosis: Severe episode of recurrent major depressive disorder, with psychotic features (HCC) [F33.3]    1. Severe episode of recurrent major depressive disorder, with psychotic features (HCC)   2. GAD (generalized anxiety disorder)       Ethelle Herb, LCSW

## 2023-06-29 NOTE — Psych (Signed)
 Virtual Visit via Video Note  I connected with Kenyan Mark Vanderwoude on 06/07/23 at  9:00 AM EDT by a video enabled telemedicine application and verified that I am speaking with the correct person using two identifiers.  Location: Patient: patient home Provider: clinical home office   I discussed the limitations of evaluation and management by telemedicine and the availability of in person appointments. The patient expressed understanding and agreed to proceed.  I discussed the assessment and treatment plan with the patient. The patient was provided an opportunity to ask questions and all were answered. The patient agreed with the plan and demonstrated an understanding of the instructions.   The patient was advised to call back or seek an in-person evaluation if the symptoms worsen or if the condition fails to improve as anticipated.  Pt was provided 240 minutes of non-face-to-face time during this encounter.   Ethelle Herb, LCSW   [facility] PHP THERAPIST PROGRESS NOTE  Lucy Woolever [id number]  Session Time: 9:00 - 10:00  Participation Level: Active  Behavioral Response: CasualAlertDepressed  Type of Therapy: Group Therapy  Treatment Goals addressed: Coping  Progress Towards Goals: Progressing  Interventions: CBT, DBT, Supportive, and Reframing  Summary: Tigran Haynie is a 33 y.o. male who presents with depression symptoms.  Clinician led check-in regarding current stressors and situation, and review of patient completed daily inventory. Clinician utilized active listening and empathetic response and validated patient emotions. Clinician facilitated processing group on pertinent issues.   Therapist Response: Patient arrived within time allowed. Patient rates his mood at a 5 on a scale of 1-10 with 10 being best. Pt states he feels "okay." Pt states he slept 12 broken hours and ate 1x. Pt reports he feels ill and is struggling with cold like symptoms. Pt reports he  spent yesterday in and out of bed resting and sleeping when he could. Pt reports his mind has not been active during this illness and he has been "blank." Pt able to process. Pt engaged in discussion.          Session Time: 10:00 am - 11:00 am   Participation Level: Active   Behavioral Response: CasualAlertDepressed   Type of Therapy: Group Therapy   Treatment Goals addressed: Coping   Progress Towards Goals: Progressing   Interventions: CBT, DBT, Solution Focused, Strength-based, Supportive, and Reframing   Summary: Cln led discussion on "spiraling" or when our thoughts compound on one another to descend our feelings into worse and worse situations. Group members discussed the ways in which spiraling is difficult for them and when they are especially vulnerable. Cln offered DBT STOP and distraction skills as a way to halt the spiral once you recognize it.    Therapist Response: Pt engaged in discussion and reports spiraling is a major issue for them. Pt is able to increase awareness of spiraling throughout the discussion.          Session Time: 11:00 -12:00   Participation Level: Active   Behavioral Response: CasualAlertDepressed   Type of Therapy: Group Therapy   Treatment Goals addressed: Coping   Progress Towards Goals: Progressing   Interventions: CBT, DBT, Solution Focused, Strength-based, Supportive, and Reframing   Summary: Cln continued topic of boundaries and introduced how to set and maintain healthy boundaries. Cln utilized handout "how to set boundaries" and group members worked through examples to practice setting appropriate boundaries.    Therapist Response: Pt engaged in discussion and reports struggle with maintaining boundaries.  Session Time: 12:00 -1:00   Participation Level: Active   Behavioral Response: CasualAlertDepressed   Type of Therapy: Group therapy   Treatment Goals addressed: Coping   Progress Towards Goals: Progressing    Interventions: OT group   Summary: 12:00 - 12:50: Occupational Therapy group with cln E. Hollan.  12:50 - 1:00 Clinician assessed for immediate needs, medication compliance and efficacy, and safety concerns.   Therapist Response: 12:00 - 12:50: Pt participated 12:50 - 1:00 pm: At check-out, patient reports no immediate concerns. Patient demonstrates progress as evidenced by continued engagement and responsiveness to treatment. Patient denies SI/HI/self-harm thoughts at the end of group.   Suicidal/Homicidal: Nowithout intent/plan  Plan: Pt will continue in PHP while working to decrease depression symptoms, increase daily functioning, and increase ability to manage symptoms in a healthy manner.   Collaboration of Care: Medication Management AEB Laymon Priest, MD  Patient/Guardian was advised Release of Information must be obtained prior to any record release in order to collaborate their care with an outside provider. Patient/Guardian was advised if they have not already done so to contact the registration department to sign all necessary forms in order for us  to release information regarding their care.   Consent: Patient/Guardian gives verbal consent for treatment and assignment of benefits for services provided during this visit. Patient/Guardian expressed understanding and agreed to proceed.   Diagnosis: Severe episode of recurrent major depressive disorder, with psychotic features (HCC) [F33.3]    1. Severe episode of recurrent major depressive disorder, with psychotic features (HCC)   2. GAD (generalized anxiety disorder)       Ethelle Herb, LCSW

## 2023-06-29 NOTE — Psych (Signed)
 Virtual Visit via Video Note  I connected with Joe Haley on 06/12/23 at  9:00 AM EDT by a video enabled telemedicine application and verified that I am speaking with the correct person using two identifiers.  Location: Patient: patient home Provider: clinical home office   I discussed the limitations of evaluation and management by telemedicine and the availability of in person appointments. The patient expressed understanding and agreed to proceed.  I discussed the assessment and treatment plan with the patient. The patient was provided an opportunity to ask questions and all were answered. The patient agreed with the plan and demonstrated an understanding of the instructions.   The patient was advised to call back or seek an in-person evaluation if the symptoms worsen or if the condition fails to improve as anticipated.  Pt was provided 240 minutes of non-face-to-face time during this encounter.   Joe Herb, LCSW   [facility] PHP THERAPIST PROGRESS NOTE  Joe Haley [id number]  Session Time: 9:00 - 10:00  Participation Level: Active  Behavioral Response: CasualAlertDepressed  Type of Therapy: Group Therapy  Treatment Goals addressed: Coping  Progress Towards Goals: Progressing  Interventions: CBT, DBT, Supportive, and Reframing  Summary: Joe Haley is a 33 y.o. male who presents with depression symptoms.  Clinician led check-in regarding current stressors and situation, and review of patient completed daily inventory. Clinician utilized active listening and empathetic response and validated patient emotions. Clinician facilitated processing group on pertinent issues.   Therapist Response: Patient arrived within time allowed. Patient rates his mood at a 5 on a scale of 1-10 with 10 being best. Pt states he feels "down." Pt states he slept 10 broken hours and ate 1x. Pt reports was in bed all day yesterday "wasting away." Pt reports that it feels like  too much effort to complete tasks and he is overwhelmed by the idea of getting out of bed, showering, or doing chores. Pt struggling with mood dependent thinking. Pt able to process. Pt engaged in discussion.          Session Time: 10:00 am - 11:00 am   Participation Level: Active   Behavioral Response: CasualAlertDepressed   Type of Therapy: Group Therapy   Treatment Goals addressed: Coping   Progress Towards Goals: Progressing   Interventions: CBT, DBT, Solution Focused, Strength-based, Supportive, and Reframing   Summary: Cln led discussion on personal standards and they way in which it impacts the way we view ourselves and our abilities. Group members discussed judgment, struggles, and barriers they experience in terms of personal standards. Cln brought in topics of balance, grace, and kindness. Cln proposed the "best friend test" as a way to calibrate whether we are viewing our situation with kindness or harshness.    Therapist Response: Pt engaged in discussion and reports willingness to utilize the best friend test.            Session Time: 11:00 -12:00   Participation Level: Active   Behavioral Response: CasualAlertDepressed   Type of Therapy: Group Therapy   Treatment Goals addressed: Coping   Progress Towards Goals: Progressing   Interventions: CBT, DBT, Solution Focused, Strength-based, Supportive, and Reframing   Summary: Cln led discussion on the stigma of mental health and the way it has impacted pt's experience of their own mental health. Group members shared struggles such as lack of acceptance, increase in isolation, being scared of backlash when disclosing MH, and impact in support. Cln created space for pt's to process and  share. Cln highlighted support groups as a safe space.    Therapist Response: Pt engaged in discussion and shared ways stigma has impacted them.           Session Time: 12:00 -1:00   Participation Level: Active   Behavioral  Response: CasualAlertDepressed   Type of Therapy: Group therapy   Treatment Goals addressed: Coping   Progress Towards Goals: Progressing   Interventions: OT group   Summary: 12:00 - 12:50: Occupational Therapy group with cln E. Hollan.  12:50 - 1:00 Clinician assessed for immediate needs, medication compliance and efficacy, and safety concerns.   Therapist Response: 12:00 - 12:50: Pt participated 12:50 - 1:00 pm: At check-out, patient reports no immediate concerns. Patient demonstrates progress as evidenced by continued engagement and responsiveness to treatment. Patient denies SI/HI/self-harm thoughts at the end of group.   Suicidal/Homicidal: Nowithout intent/plan  Plan: Pt will continue in PHP while working to decrease depression symptoms, increase daily functioning, and increase ability to manage symptoms in a healthy manner.   Collaboration of Care: Medication Management AEB Joe Priest, MD  Patient/Guardian was advised Release of Information must be obtained prior to any record release in order to collaborate their care with an outside provider. Patient/Guardian was advised if they have not already done so to contact the registration department to sign all necessary forms in order for us  to release information regarding their care.   Consent: Patient/Guardian gives verbal consent for treatment and assignment of benefits for services provided during this visit. Patient/Guardian expressed understanding and agreed to proceed.   Diagnosis: Severe episode of recurrent major depressive disorder, with psychotic features (HCC) [F33.3]    1. Severe episode of recurrent major depressive disorder, with psychotic features (HCC)   2. GAD (generalized anxiety disorder)       Joe Herb, LCSW

## 2023-06-29 NOTE — Psych (Signed)
 Virtual Visit via Video Note  I connected with Joe Haley on 06/13/23 at  9:00 AM EDT by a video enabled telemedicine application and verified that I am speaking with the correct person using two identifiers.  Location: Patient: patient home Provider: clinical home office   I discussed the limitations of evaluation and management by telemedicine and the availability of in person appointments. The patient expressed understanding and agreed to proceed.  I discussed the assessment and treatment plan with the patient. The patient was provided an opportunity to ask questions and all were answered. The patient agreed with the plan and demonstrated an understanding of the instructions.   The patient was advised to call back or seek an in-person evaluation if the symptoms worsen or if the condition fails to improve as anticipated.  Pt was provided 240 minutes of non-face-to-face time during this encounter.   Ethelle Herb, LCSW   [facility] PHP THERAPIST PROGRESS NOTE  Joe Haley [id number]  Session Time: 9:00 - 10:00  Participation Level: Active  Behavioral Response: CasualAlertDepressed  Type of Therapy: Group Therapy  Treatment Goals addressed: Coping  Progress Towards Goals: Progressing  Interventions: CBT, DBT, Supportive, and Reframing  Summary: Joe Haley is a 33 y.o. male who presents with depression symptoms.  Clinician led check-in regarding current stressors and situation, and review of patient completed daily inventory. Clinician utilized active listening and empathetic response and validated patient emotions. Clinician facilitated processing group on pertinent issues.   Therapist Response: Patient arrived within time allowed. Patient rates his mood at a 5 on a scale of 1-10 with 10 being best. Pt states he feels "okay." Pt states he slept 7 broken hours and ate 3x. Pt reports he had some high spots in his day yesterday. Pt shares he skipped his nap  and had more energy. Pt states he cleaned his room, danced a little, and had dinner with his parents. Pt reports talking to his friend on the phone.  Pt able to process. Pt engaged in discussion.          Session Time: 10:00 am - 11:00 am   Participation Level: Active   Behavioral Response: CasualAlertDepressed   Type of Therapy: Group Therapy   Treatment Goals addressed: Coping   Progress Towards Goals: Progressing   Interventions: CBT, DBT, Solution Focused, Strength-based, Supportive, and Reframing   Summary: Cln led processing group for pt's current struggles. Group members shared stressors and provided support and feedback. Cln brought in topics of boundaries, healthy relationships, and unhealthy thought processes to inform discussion.    Therapist Response: Pt able to process and provide support to group.            Session Time: 11:00 -12:00   Participation Level: Active   Behavioral Response: CasualAlertDepressed   Type of Therapy: Group Therapy   Treatment Goals addressed: Coping   Progress Towards Goals: Progressing   Interventions: Strength-based, Supportive, and Reframing   Summary: Chaplaincy group with K. Claussen   Therapist Response: Pt participated and engaged in discussion.          Session Time: 12:00 -1:00   Participation Level: Active   Behavioral Response: CasualAlertDepressed   Type of Therapy: Group therapy   Treatment Goals addressed: Coping   Progress Towards Goals: Progressing   Interventions: OT group   Summary: 12:00 - 12:50: Occupational Therapy group with cln E. Hollan.  12:50 - 1:00 Clinician assessed for immediate needs, medication compliance and efficacy, and safety concerns.  Therapist Response: 12:00 - 12:50: Pt participated 12:50 - 1:00 pm: At check-out, patient reports no immediate concerns. Patient demonstrates progress as evidenced by continued engagement and responsiveness to treatment. Patient denies  SI/HI/self-harm thoughts at the end of group.   Suicidal/Homicidal: Nowithout intent/plan  Plan: Pt will continue in PHP while working to decrease depression symptoms, increase daily functioning, and increase ability to manage symptoms in a healthy manner.   Collaboration of Care: Medication Management AEB Laymon Priest, MD  Patient/Guardian was advised Release of Information must be obtained prior to any record release in order to collaborate their care with an outside provider. Patient/Guardian was advised if they have not already done so to contact the registration department to sign all necessary forms in order for us  to release information regarding their care.   Consent: Patient/Guardian gives verbal consent for treatment and assignment of benefits for services provided during this visit. Patient/Guardian expressed understanding and agreed to proceed.   Diagnosis: Severe recurrent major depression without psychotic features (HCC) [F33.2]    1. Severe recurrent major depression without psychotic features (HCC)   2. GAD (generalized anxiety disorder)       Ethelle Herb, LCSW

## 2023-06-29 NOTE — Psych (Signed)
 Virtual Visit via Video Note  I connected with Apollo Mark Nored on 06/08/23 at  9:00 AM EDT by a video enabled telemedicine application and verified that I am speaking with the correct person using two identifiers.  Location: Patient: patient home Provider: clinical home office   I discussed the limitations of evaluation and management by telemedicine and the availability of in person appointments. The patient expressed understanding and agreed to proceed.  I discussed the assessment and treatment plan with the patient. The patient was provided an opportunity to ask questions and all were answered. The patient agreed with the plan and demonstrated an understanding of the instructions.   The patient was advised to call back or seek an in-person evaluation if the symptoms worsen or if the condition fails to improve as anticipated.  Pt was provided 120 minutes of non-face-to-face time during this encounter.   Ethelle Herb, LCSW   [facility] PHP THERAPIST PROGRESS NOTE  Joe Haley [id number]  Session Time: 9:00 - 10:00  Participation Level: Active  Behavioral Response: CasualAlertDepressed  Type of Therapy: Group Therapy  Treatment Goals addressed: Coping  Progress Towards Goals: Progressing  Interventions: CBT, DBT, Supportive, and Reframing  Summary: Joe Haley is a 33 y.o. male who presents with depression symptoms.  Clinician led check-in regarding current stressors and situation, and review of patient completed daily inventory. Clinician utilized active listening and empathetic response and validated patient emotions. Clinician facilitated processing group on pertinent issues.   Therapist Response: Patient arrived within time allowed. Patient rates his mood at a 5 on a scale of 1-10 with 10 being best. Pt states he feels "okay." Pt states he slept 7 hours and ate 3x. Pt reports he continues to feel ill, but a little better than the day before. Pt states he  spoke to his friend online and otherwise rested in bed. Pt reports realizing he wakes up throughout the night looking at his window, which is where his brother would come at night before his death. Pt states feeling pain in his chest when he realized this during one of those waking up times. Pt able to process. Pt engaged in discussion.          Session Time: 10:00 am - 11:00 am   Participation Level: Active   Behavioral Response: CasualAlertDepressed   Type of Therapy: Group Therapy   Treatment Goals addressed: Coping   Progress Towards Goals: Progressing   Interventions: CBT, DBT, Solution Focused, Strength-based, Supportive, and Reframing   Summary: Cln led discussion on negative self-talk and how it affects us . Cln utilized CBT to discuss how thoughts shape our feelings and actions. Group members shared how negative thinking affects them and worked to reframe their negative thinking.    Therapist Response: Pt engaged in discussion and reports understanding.    **Pt chose to leave group at 11 stating he felt too ill to remain. Pt denies SI before leaving session.     Suicidal/Homicidal: Nowithout intent/plan  Plan: Pt will continue in PHP while working to decrease depression symptoms, increase daily functioning, and increase ability to manage symptoms in a healthy manner.   Collaboration of Care: Medication Management AEB Laymon Priest, MD  Patient/Guardian was advised Release of Information must be obtained prior to any record release in order to collaborate their care with an outside provider. Patient/Guardian was advised if they have not already done so to contact the registration department to sign all necessary forms in order for us  to release  information regarding their care.   Consent: Patient/Guardian gives verbal consent for treatment and assignment of benefits for services provided during this visit. Patient/Guardian expressed understanding and agreed to proceed.    Diagnosis: Severe episode of recurrent major depressive disorder, with psychotic features (HCC) [F33.3]    1. Severe episode of recurrent major depressive disorder, with psychotic features (HCC)   2. GAD (generalized anxiety disorder)       Ethelle Herb, LCSW

## 2023-06-29 NOTE — Psych (Signed)
 Virtual Visit via Video Note  I connected with [place] Mark Kham on 06/11/23 at  9:00 AM EDT by a video enabled telemedicine application and verified that I am speaking with the correct person using two identifiers.  Location: Patient: patient home Provider: clinical home office   I discussed the limitations of evaluation and management by telemedicine and the availability of in person appointments. The patient expressed understanding and agreed to proceed.  I discussed the assessment and treatment plan with the patient. The patient was provided an opportunity to ask questions and all were answered. The patient agreed with the plan and demonstrated an understanding of the instructions.   The patient was advised to call back or seek an in-person evaluation if the symptoms worsen or if the condition fails to improve as anticipated.  Pt was provided 240 minutes of non-face-to-face time during this encounter.   Ethelle Herb, LCSW   [facility] PHP THERAPIST PROGRESS NOTE  Zebulen Simonis [id number]  Session Time: 9:00 - 10:00  Participation Level: Active  Behavioral Response: CasualAlertDepressed  Type of Therapy: Group Therapy  Treatment Goals addressed: Coping  Progress Towards Goals: Progressing  Interventions: CBT, DBT, Supportive, and Reframing  Summary: Sven Pinheiro is a 33 y.o. male who presents with depression symptoms.  Clinician led check-in regarding current stressors and situation, and review of patient completed daily inventory. Clinician utilized active listening and empathetic response and validated patient emotions. Clinician facilitated processing group on pertinent issues.   Therapist Response: Patient arrived within time allowed. Patient rates his mood at a 5 on a scale of 1-10 with 10 being best. Pt states he feels "a little down." Pt states he slept 4 broken hours and ate 2x. Pt reports he physically feels all better. Pt states the weekend was  difficult due to Mother's Day. Pt shares this is the first mother's day without his brother and the family was sad. Pt reports the night was worse than normal and he woke up 6-7 times staring at the window. Pt reports using YouTube to distract and calm him down. Pt identifies hopelessness and denies SI/HI. Pt able to process. Pt engaged in discussion.          Session Time: 10:00 am - 11:00 am   Participation Level: Active   Behavioral Response: CasualAlertDepressed   Type of Therapy: Group Therapy   Treatment Goals addressed: Coping   Progress Towards Goals: Progressing   Interventions: CBT, DBT, Solution Focused, Strength-based, Supportive, and Reframing   Summary: Cln led processing group for pt's current struggles. Group members shared stressors and provided support and feedback. Cln brought in topics of boundaries, healthy relationships, and unhealthy thought processes to inform discussion.    Therapist Response:  Pt able to process and provide support to group.          Session Time: 11:00 -12:00   Participation Level: Active   Behavioral Response: CasualAlertDepressed   Type of Therapy: Group Therapy   Treatment Goals addressed: Coping   Progress Towards Goals: Progressing   Interventions: CBT, DBT, Solution Focused, Strength-based, Supportive, and Reframing   Summary: Cln led discussion on "work arounds" or finding intermediate solutions while we are in the process of working on a final solution. Group members shared situations in which they are struggling with an issue that they are unable to fix quickly. Cln encouraged pt's to consider ways to make things "doable" and not let "perfect" stand in the way.    Therapist Response: Pt engaged  in discussion and is able to connect with topic.        Session Time: 12:00 -1:00   Participation Level: Active   Behavioral Response: CasualAlertDepressed   Type of Therapy: Group therapy   Treatment Goals addressed:  Coping   Progress Towards Goals: Progressing   Interventions: OT group   Summary: 12:00 - 12:50: Occupational Therapy group with cln E. Hollan.  12:50 - 1:00 Clinician assessed for immediate needs, medication compliance and efficacy, and safety concerns.   Therapist Response: 12:00 - 12:50: Pt participated 12:50 - 1:00 pm: At check-out, patient reports no immediate concerns. Patient demonstrates progress as evidenced by continued engagement and responsiveness to treatment. Patient denies SI/HI/self-harm thoughts at the end of group.   Suicidal/Homicidal: Nowithout intent/plan  Plan: Pt will continue in PHP while working to decrease depression symptoms, increase daily functioning, and increase ability to manage symptoms in a healthy manner.   Collaboration of Care: Medication Management AEB Laymon Priest, MD  Patient/Guardian was advised Release of Information must be obtained prior to any record release in order to collaborate their care with an outside provider. Patient/Guardian was advised if they have not already done so to contact the registration department to sign all necessary forms in order for us  to release information regarding their care.   Consent: Patient/Guardian gives verbal consent for treatment and assignment of benefits for services provided during this visit. Patient/Guardian expressed understanding and agreed to proceed.   Diagnosis: Severe episode of recurrent major depressive disorder, with psychotic features (HCC) [F33.3]    1. Severe episode of recurrent major depressive disorder, with psychotic features (HCC)   2. GAD (generalized anxiety disorder)       Ethelle Herb, LCSW

## 2023-06-30 NOTE — Psych (Signed)
 Virtual Visit via Video Note  I connected with Ravinder Mark Natzke on 06/15/23 at  9:00 AM EDT by a video enabled telemedicine application and verified that I am speaking with the correct person using two identifiers.  Location: Patient: patient home Provider: clinical home office   I discussed the limitations of evaluation and management by telemedicine and the availability of in person appointments. The patient expressed understanding and agreed to proceed.  I discussed the assessment and treatment plan with the patient. The patient was provided an opportunity to ask questions and all were answered. The patient agreed with the plan and demonstrated an understanding of the instructions.   The patient was advised to call back or seek an in-person evaluation if the symptoms worsen or if the condition fails to improve as anticipated.  Pt was provided 240 minutes of non-face-to-face time during this encounter.   Ethelle Herb, LCSW   [facility] PHP THERAPIST PROGRESS NOTE  Trequan Marsolek [id number]  Session Time: 9:00 - 10:00  Participation Level: Active  Behavioral Response: CasualAlertDepressed  Type of Therapy: Group Therapy  Treatment Goals addressed: Coping  Progress Towards Goals: Progressing  Interventions: CBT, DBT, Supportive, and Reframing  Summary: Kristin Lamagna is a 33 y.o. male who presents with depression symptoms.  Clinician led check-in regarding current stressors and situation, and review of patient completed daily inventory. Clinician utilized active listening and empathetic response and validated patient emotions. Clinician facilitated processing group on pertinent issues.   Therapist Response: Patient arrived within time allowed. Patient rates his mood at a 6 on a scale of 1-10 with 10 being best. Pt states he feels "a little better." Pt states he slept 7 broken hours and ate 3x. Pt reports he played basketball with his friend yesterday and it felt  "normal" for the first time in a while. Pt reports over the past 2 days he has noticed improved mood and hasn't cried. Pt states he is still waking up every night feeling numb. Pt able to process. Pt engaged in discussion.          Session Time: 10:00 am - 11:00 am   Participation Level: Active   Behavioral Response: CasualAlertDepressed   Type of Therapy: Group Therapy   Treatment Goals addressed: Coping   Progress Towards Goals: Progressing   Interventions: CBT, DBT, Solution Focused, Strength-based, Supportive, and Reframing   Summary: Cln led discussion on making difficult decisions. Group members discussed decisions they are struggling with. Cln utilized CBT thought challenging and DBT walking the middle path to inform discussion.   Therapist Response:  Pt engaged in discussion.            Session Time: 11:00 -12:00   Participation Level: Active   Behavioral Response: CasualAlertDepressed   Type of Therapy: Group Therapy   Treatment Goals addressed: Coping   Progress Towards Goals: Progressing   Interventions: CBT, DBT, Solution Focused, Strength-based, Supportive, and Reframing   Summary: Cln led discussion on ways to manage stressors and feelings over the weekend. Group members  brainstormed things to do over the weekend for multiple levels of energy, access, and moods. Cln reviewed crisis services should they be needed and provided pt's with the text crisis line, mobile crisis, national suicide hotline, [facility] 24/7 line, and information on [facility].      Therapist Response: Pt engaged in discussion and is able to identify 3 ideas of what to do over the weekend to keep their mind engaged.  Session Time: 12:00 -1:00   Participation Level: Active   Behavioral Response: CasualAlertDepressed   Type of Therapy: Group therapy   Treatment Goals addressed: Coping   Progress Towards Goals: Progressing   Interventions: OT group   Summary: 12:00  - 12:50: Occupational Therapy group with cln E. Hollan.  12:50 - 1:00 Clinician assessed for immediate needs, medication compliance and efficacy, and safety concerns.   Therapist Response: 12:00 - 12:50: Pt participated 12:50 - 1:00 pm: At check-out, patient reports no immediate concerns. Patient demonstrates progress as evidenced by continued engagement and responsiveness to treatment. Patient denies SI/HI/self-harm thoughts at the end of group.   Suicidal/Homicidal: Nowithout intent/plan  Plan: Pt will discharge from PHP due to meeting treatment goals of  decreased depression symptoms, increased daily functioning, and increased ability to manage symptoms in a healthy manner. Pt will return to previous providers for outpatient therapy/psychiatry. Pt and provider are aligned with step down plan. Pt denies SI/HI at time of discharge.   Collaboration of Care: Medication Management AEB Laymon Priest, MD  Patient/Guardian was advised Release of Information must be obtained prior to any record release in order to collaborate their care with an outside provider. Patient/Guardian was advised if they have not already done so to contact the registration department to sign all necessary forms in order for us  to release information regarding their care.   Consent: Patient/Guardian gives verbal consent for treatment and assignment of benefits for services provided during this visit. Patient/Guardian expressed understanding and agreed to proceed.   Diagnosis: Severe recurrent major depression without psychotic features (HCC) [F33.2]    1. Severe recurrent major depression without psychotic features (HCC)   2. GAD (generalized anxiety disorder)       Ethelle Herb, LCSW

## 2023-07-04 ENCOUNTER — Other Ambulatory Visit: Payer: Self-pay | Admitting: Nurse Practitioner

## 2023-07-05 NOTE — Telephone Encounter (Signed)
 Requested medications are due for refill today.  yes  Requested medications are on the active medications list.  yes  Last refill. 04/30/2023  Future visit scheduled.   yes  Notes to clinic.  Please review for refill. Dosage on med list is different than that on rx refill request.    Requested Prescriptions  Pending Prescriptions Disp Refills   WEGOVY  0.25 MG/0.5ML SOAJ [Pharmacy Med Name: Wegovy  0.25 MG/0.5ML Subcutaneous Solution Auto-injector] 4 mL 0    Sig: INJECT ONE SYRINGEFUL INTO THE SKIN WEEKLY     Endocrinology:  Diabetes - GLP-1 Receptor Agonists - semaglutide  Passed - 07/05/2023  4:54 PM      Passed - HBA1C in normal range and within 180 days    Hemoglobin A1C  Date Value Ref Range Status  01/10/2014 5.4 4.2 - 6.3 % Final    Comment:    The American Diabetes Association recommends that a primary goal of therapy should be <7% and that physicians should reevaluate the treatment regimen in patients with HbA1c values consistently >8%.    HB A1C (BAYER DCA - WAIVED)  Date Value Ref Range Status  07/26/2017 4.8 <7.0 % Final    Comment:                                          Diabetic Adult            <7.0                                       Healthy Adult        4.3 - 5.7                                                           (DCCT/NGSP) American Diabetes Association's Summary of Glycemic Recommendations for Adults with Diabetes: Hemoglobin A1c <7.0%. More stringent glycemic goals (A1c <6.0%) may further reduce complications at the cost of increased risk of hypoglycemia.    Hgb A1c MFr Bld  Date Value Ref Range Status  01/16/2023 5.5 4.8 - 5.6 % Final    Comment:             Prediabetes: 5.7 - 6.4          Diabetes: >6.4          Glycemic control for adults with diabetes: <7.0          Passed - Cr in normal range and within 360 days    Creatinine  Date Value Ref Range Status  01/09/2014 1.34 (H) 0.60 - 1.30 mg/dL Final   Creatinine, Ser  Date Value  Ref Range Status  01/16/2023 0.84 0.76 - 1.27 mg/dL Final         Passed - Valid encounter within last 6 months    Recent Outpatient Visits           3 weeks ago Morbid obesity [facility])   [facility] Aileen Alexanders, NP   2 months ago Severe recurrent major depression without psychotic features [facility])   [facility] Aileen Alexanders, NP

## 2023-07-09 ENCOUNTER — Ambulatory Visit (INDEPENDENT_AMBULATORY_CARE_PROVIDER_SITE_OTHER): Payer: MEDICAID | Admitting: Professional Counselor

## 2023-07-09 DIAGNOSIS — F331 Major depressive disorder, recurrent, moderate: Secondary | ICD-10-CM

## 2023-07-09 DIAGNOSIS — F411 Generalized anxiety disorder: Secondary | ICD-10-CM

## 2023-07-09 NOTE — Progress Notes (Signed)
  THERAPIST PROGRESS NOTE  Session Time: 10:55 AM - 11:40 AM   Participation Level: Active  Behavioral Response: Casual, Alert, Anxious and Dysphoric  Type of Therapy: Individual Therapy  Treatment Goals addressed:  Active OP Depression  LTG: "I just want to be more motivated. Have a better outlook. I'd like to be in a better head space so I can start working again and be better health wise.                 Start:  02/15/23    Expected End:  02/14/24      STG: Increase coping skills to manage depression and improve ability to perform daily activities    STG: Improve thinking patterns AEB identifying and restructuring cognitive distortions over the next 12 weeks.     STG: Reduce unhealthy coping mechanisms such as stress eating by implementing positive replacement behaviors and other emotion regulation skills over the next 12 weeks   ProgressTowards Goals: Progressing  Interventions: CBT, Motivational Interviewing, and Supportive  Summary: Jahir Mark Stockhausen is a 32 y.o. male who presents with a history of anxiety and depression. He appeared somber but oriented x5. He stated he has been worried about his best friend because his friend hasn't communicated with him in over a week. [name] discussed his friend's struggled with alcoholism. He was ambivalent about reframing his perception of his friend's behavior. He identified an affirmation that will help him share his feelings with others more. [name] has continued to play in card tournaments and has two scheduled this weekend. He still struggles with doing activities on his own. He was receptive to chunking and will try to use this with walking in the neighborhood. [name] did not complete the letter to his brother or impact statement. He agreed he originally avoided it but then forgot about it. He will reconsider this exercise.   Therapist Response: Conducted session with [name]. Began session with check-in/update since previous session. Utilized  empathetic and reflective listening. Used open-ended questions to facilitate discussion and summarized [name]'s thoughts/feelings. Reframed thinking around [name]'s friends behavior. Advised [name] of Al-Anon for those with relationships with alcoholics. Discussed chunking to help with behavior activation. Used gentle confrontation about [name] forgetting homework assignment. Scheduled additional appointment and concluded session.   Suicidal/Homicidal: No  Plan: Return again in 4 weeks.  Diagnosis: Major depressive disorder, recurrent episode, moderate (HCC)  GAD (generalized anxiety disorder)  Collaboration of Care: Medication Management AEB chart review  Patient/Guardian was advised Release of Information must be obtained prior to any record release in order to collaborate their care with an outside provider. Patient/Guardian was advised if they have not already done so to contact the registration department to sign all necessary forms in order for us  to release information regarding their care.   Consent: Patient/Guardian gives verbal consent for treatment and assignment of benefits for services provided during this visit. Patient/Guardian expressed understanding and agreed to proceed.   Len Quale, [facility] 07/09/2023

## 2023-07-11 ENCOUNTER — Other Ambulatory Visit: Payer: Self-pay | Admitting: Nurse Practitioner

## 2023-07-11 DIAGNOSIS — F332 Major depressive disorder, recurrent severe without psychotic features: Secondary | ICD-10-CM

## 2023-07-11 NOTE — Telephone Encounter (Signed)
 Copied from CRM [id number]. Topic: Clinical - Medication Refill >> Jul 11, 2023  1:36 PM Ameerah G wrote: Medication: Semaglutide -Weight Management (WEGOVY ) 0.5,cariprazine  (VRAYLAR ) 3 MG capsule,levothyroxine  (SYNTHROID ) 200 MCG tablet,venlafaxine  XR (EFFEXOR  XR) 75 MG 24 hr capsule,  Has the patient contacted their pharmacy? Yes (Agent: If no, request that the patient contact the pharmacy for the refill. If patient does not wish to contact the pharmacy document the reason why and proceed with request.) (Agent: If yes, when and what did the pharmacy advise?)  This is the patient's preferred pharmacy:  [facility] [address], [place] - [address] [place] [address] Phone: [phone] Fax: [phone]  Is this the correct pharmacy for this prescription? Yes If no, delete pharmacy and type the correct one.   Has the prescription been filled recently? No  Is the patient out of the medication? No few pills left of each medication   Has the patient been seen for an appointment in the last year OR does the patient have an upcoming appointment? Yes  Can we respond through MyChart? Yes  Agent: Please be advised that Rx refills may take up to 3 business days. We ask that you follow-up with your pharmacy.

## 2023-07-12 ENCOUNTER — Other Ambulatory Visit: Payer: Self-pay

## 2023-07-12 ENCOUNTER — Encounter: Payer: Self-pay | Admitting: Psychiatry

## 2023-07-12 ENCOUNTER — Ambulatory Visit: Payer: MEDICAID | Admitting: Psychiatry

## 2023-07-12 VITALS — BP 124/79 | HR 83 | Temp 96.5°F | Ht 64.02 in | Wt >= 6400 oz

## 2023-07-12 DIAGNOSIS — F411 Generalized anxiety disorder: Secondary | ICD-10-CM | POA: Diagnosis not present

## 2023-07-12 DIAGNOSIS — F332 Major depressive disorder, recurrent severe without psychotic features: Secondary | ICD-10-CM

## 2023-07-12 DIAGNOSIS — T50905A Adverse effect of unspecified drugs, medicaments and biological substances, initial encounter: Secondary | ICD-10-CM

## 2023-07-12 DIAGNOSIS — R635 Abnormal weight gain: Secondary | ICD-10-CM | POA: Diagnosis not present

## 2023-07-12 DIAGNOSIS — F331 Major depressive disorder, recurrent, moderate: Secondary | ICD-10-CM | POA: Diagnosis not present

## 2023-07-12 DIAGNOSIS — Z634 Disappearance and death of family member: Secondary | ICD-10-CM

## 2023-07-12 MED ORDER — CARIPRAZINE HCL 3 MG PO CAPS: 3.0000 mg | ORAL_CAPSULE | Freq: Every day | ORAL | 1 refills | Status: AC

## 2023-07-12 MED ORDER — VENLAFAXINE HCL ER 150 MG PO CP24: 150.0000 mg | ORAL_CAPSULE | Freq: Every day | ORAL | 1 refills | Status: DC

## 2023-07-12 MED ORDER — TRAZODONE HCL 50 MG PO TABS: 50.0000 mg | ORAL_TABLET | Freq: Every day | ORAL | 1 refills | Status: AC

## 2023-07-12 NOTE — Progress Notes (Signed)
 BH MD OP Progress Note  07/12/2023 9:14 AM Joe Haley  MRN:  [id number]  Chief Complaint:  Chief Complaint  Patient presents with   Follow-up   Anxiety   Depression   Medication Refill    HPI: Joe Haley is a 33 year old Caucasian male, unemployed, lives in [place] with his parents, has a history of MDD, GAD, bereavement, hypothyroidism, morbid obesity, hyperlipidemia was evaluated in office today for a follow-up appointment.  He has been engaging in self-affirmation exercises and writing a letter to his brother as part of his therapy, which he finds helpful in processing thoughts and improving motivation. There is an improvement in depression and anxiety symptoms, particularly in managing overeating, which he attributes to his medication regimen.  He has become more active, largely due to the influence of a close friend, Jereld Monas, who encourages him to engage in physical activities like hiking and playing basketball. He is not currently working but is focusing on losing weight to become more fit for employment.  He mentions experiencing excessive sleep over the past two days, sleeping over 20 hours, which he attributes to feeling drained. Outside of these two days, his sleep schedule has been more regular.  He is currently taking Vraylar  and venlafaxine , having recently completed  partial hospitalization program two to three weeks ago. He was in the program for two weeks. No side effects from venlafaxine  and no muscle spasms, rigidity, tremor, or stiffness from his medications.  He acknowledges having a day where he felt very down and had thoughts about the pointlessness of life, but he did not have any plans or intentions to harm himself.  He is also on Wegovy  for weight loss, having been on it for three weeks and has lost five pounds. The dose was recently increased from 0.3 to 0.5 mg.   Visit Diagnosis:    ICD-10-CM   1. Major depressive disorder, recurrent episode,  moderate (HCC)  F33.1 venlafaxine  XR (EFFEXOR  XR) 150 MG 24 hr capsule    2. GAD (generalized anxiety disorder)  F41.1     3. Bereavement  Z63.4 cariprazine  (VRAYLAR ) 3 MG capsule    traZODone  (DESYREL ) 50 MG tablet    4. Weight gain due to medication  R63.5    T50.905A    due to antipsychotic medication      Past Psychiatric History: I have reviewed past psychiatric history from progress note on 01/08/2023.  Patient with history of suicide attempts x 2 in the past.  Patient completed PHP program at [facility] dated 06/13/2023.  Past Medical History:  Past Medical History:  Diagnosis Date   Acanthosis nigricans    Hyperlipidemia    Hypothyroidism    Ibuprofen  overdose    Major depressive disorder    Obesity    Restless leg syndrome    History reviewed. No pertinent surgical history.  Family Psychiatric History: I have reviewed family psychiatric history from progress note on 01/08/2023.  Family History:  Family History  Problem Relation Age of Onset   Depression Sister    Depression Maternal Grandmother    Diabetes Maternal Grandmother     Social History: I have reviewed social history from progress note on 01/08/2023. Social History   Socioeconomic History   Marital status: Single    Spouse name: Not on file   Number of children: Not on file   Years of education: Not on file   Highest education level: High school graduate  Occupational History   Not on file  Tobacco Use   Smoking status: Never   Smokeless tobacco: Never  Vaping Use   Vaping status: Never Used  Substance and Sexual Activity   Alcohol use: No   Drug use: Yes    Types: Marijuana    Comment: occasional   Sexual activity: Not Currently  Other Topics Concern   Not on file  Social History Narrative   Not on file   Social Drivers of Health   Financial Resource Strain: High Risk (06/14/2023)   Overall Financial Resource Strain (CARDIA)    Difficulty of Paying Living Expenses: Hard  Food  Insecurity: No Food Insecurity (06/14/2023)   Hunger Vital Sign    Worried About Running Out of Food in the Last Year: Never true    Ran Out of Food in the Last Year: Never true  Transportation Needs: No Transportation Needs (06/14/2023)   PRAPARE - Administrator, Civil Service (Medical): No    Lack of Transportation (Non-Medical): No  Physical Activity: Inactive (06/14/2023)   Exercise Vital Sign    Days of Exercise per Week: 0 days    Minutes of Exercise per Session: 0 min  Stress: Stress Concern Present (06/14/2023)   [employer] of Occupational Health - Occupational Stress Questionnaire    Feeling of Stress : Very much  Social Connections: Socially Isolated (06/14/2023)   Social Connection and Isolation Panel    Frequency of Communication with Friends and Family: Once a week    Frequency of Social Gatherings with Friends and Family: Once a week    Attends Religious Services: Never    Database administrator or Organizations: No    Attends Engineer, structural: Never    Marital Status: Never married    Allergies: No Known Allergies  Metabolic Disorder Labs: Lab Results  Component Value Date   HGBA1C 5.5 01/16/2023   MPG 88 03/29/2016   Lab Results  Component Value Date   PROLACTIN 14.7 01/16/2023   PROLACTIN 77.0 (H) 03/29/2016   Lab Results  Component Value Date   CHOL 144 01/16/2023   TRIG 149 01/16/2023   HDL 41 01/16/2023   CHOLHDL 3.5 01/16/2023   VLDL 36 03/29/2016   LDLCALC 77 01/16/2023   LDLCALC 66 04/12/2021   Lab Results  Component Value Date   TSH 2.780 10/19/2022   TSH 0.718 10/31/2021    Therapeutic Level Labs: No results found for: LITHIUM No results found for: VALPROATE No results found for: CBMZ  Current Medications: Current Outpatient Medications  Medication Sig Dispense Refill   venlafaxine  XR (EFFEXOR  XR) 150 MG 24 hr capsule Take 1 capsule (150 mg total) by mouth daily with breakfast. Stop the 75 mg  daily. 30 capsule 1   cariprazine  (VRAYLAR ) 3 MG capsule Take 1 capsule (3 mg total) by mouth daily. 30 capsule 1   levothyroxine  (SYNTHROID ) 200 MCG tablet Take 1 tablet (200 mcg total) by mouth daily. 90 tablet 0   Semaglutide -Weight Management (WEGOVY ) 0.5 MG/0.5ML SOAJ Inject 0.5 mg into the skin once a week. 2 mL 2   traZODone  (DESYREL ) 50 MG tablet Take 1 tablet (50 mg total) by mouth at bedtime. 30 tablet 1   WEGOVY  0.25 MG/0.5ML SOAJ INJECT ONE SYRINGEFUL INTO THE SKIN ONCE WEEKLY 4 mL 0   No current facility-administered medications for this visit.     Musculoskeletal: Strength & Muscle Tone: within normal limits Gait & Station: normal Patient leans: N/A  Psychiatric Specialty Exam: Review of Systems  Psychiatric/Behavioral:  Positive for dysphoric mood and sleep disturbance. The patient is nervous/anxious.     Blood pressure 124/79, pulse 83, temperature (!) 96.5 F (35.8 C), temperature source Temporal, height 5' 4.02 (1.626 m), weight (!) 427 lb 9.6 oz (194 kg).Body mass index is 73.35 kg/m.  General Appearance: Casual  Eye Contact:  Minimal  Speech:  Clear and Coherent  Volume:  Normal  Mood:  Anxious and Depressed  Affect:  Congruent  Thought Process:  Goal Directed and Descriptions of Associations: Intact  Orientation:  Full (Time, Place, and Person)  Thought Content: Logical   Suicidal Thoughts:  No  Homicidal Thoughts:  No  Memory:  Immediate;   Fair Recent;   Fair Remote;   Fair  Judgement:  Fair  Insight:  Fair  Psychomotor Activity:  Normal  Concentration:  Concentration: Fair and Attention Span: Fair  Recall:  [employer] of Knowledge: Fair  Language: Fair  Akathisia:  No  Handed:  Right  AIMS (if indicated): done  Assets:  Communication Skills Desire for Improvement Housing Social Support Transportation  ADL's:  Intact  Cognition: WNL  Sleep:  excessive   Screenings: Geneticist, molecular Office Visit from 07/12/2023 in [facility] Office Visit from 05/03/2023 in [facility]  AIMS Total Score 0 0   AUDIT    Flowsheet Row Counselor from 01/29/2023 in [facility] Admission (Discharged) from 03/28/2016 in [facility]  Alcohol Use Disorder Identification Test Final Score (AUDIT) 3 1   GAD-7    Flowsheet Row Office Visit from 07/12/2023 in [facility] Office Visit from 06/14/2023 in [facility] Office Visit from 05/03/2023 in [facility] Office Visit from 04/30/2023 in [facility] Counselor from 01/29/2023 in [facility]  Total GAD-7 Score 8 5 8 8 18    PHQ2-9    Flowsheet Row Office Visit from 07/12/2023 in [facility] Office Visit from 06/14/2023 in [facility] Counselor from 05/22/2023 in [facility] Office Visit from 05/03/2023 in [facility] Office Visit from 04/30/2023 in [facility]  PHQ-2 Total Score 4 4 2 4 5   PHQ-9 Total Score 16 16 15 17 17    Flowsheet Row Office Visit from 07/12/2023 in [facility] Counselor from 05/22/2023 in [facility] Office Visit from 05/03/2023 in [facility]  C-SSRS RISK CATEGORY Moderate Risk Moderate Risk Moderate Risk     Assessment and Plan: Vlad Mayberry is a 33 year old Caucasian male with history of depression, anxiety was evaluated in office today.  Discussed assessment and plan as noted below.  Major depressive disorder-improving Reports some improvement in depression symptoms since completing partial  hospitalization program on 06/13/2023 as well as engaging in individual therapy with Ms. Deetta Farrow.  Recently started on venlafaxine  and tolerating it well.  Agreeable to dosage increase today. Increase Venlafaxine  extended release to 150 mg daily in the morning Continue Vraylar  3 mg daily Continue Trazodone  50 mg at bedtime Discontinue Topamax  for noncompliance. Continue individual psychotherapy with Ms. Deetta Farrow Discussed sleep hygiene techniques as well as having a structure and schedule for his day.  Generalized anxiety disorder-improving Does report engaging in more activities and is planning to start  attending gym.  Continues to be in individual psychotherapy which has been helpful. Increase Venlafaxine  extended release 150 mg daily Continue CBT with Ms. Charlene Conners Currently coping with grief better than before planning on writing a letter to cope with his grief. Continue individual therapy  Weight gain likely multifactorial-unstable Continues to manage diet and is interested in increasing his physical activity by joining a gym.  Currently on Wegovy . Continue follow-up with this provider for management of his screen.   Follow-up Follow-up in clinic in 2 months or sooner if needed.    Collaboration of Care: Collaboration of Care: Referral or follow-up with counselor/therapist AEB encouraged to continue CBT, I have reviewed notes per Ms. Deetta Farrow dated 07/09/2023 as well as PHP discharge summary dated 06/13/2023 per Ms. Lewis.  Patient/Guardian was advised Release of Information must be obtained prior to any record release in order to collaborate their care with an outside provider. Patient/Guardian was advised if they have not already done so to contact the registration department to sign all necessary forms in order for us  to release information regarding their care.   Consent: Patient/Guardian gives verbal consent for treatment and assignment of benefits for services  provided during this visit. Patient/Guardian expressed understanding and agreed to proceed.  This note was generated in part or whole with voice recognition software. Voice recognition is usually quite accurate but there are transcription errors that can and very often do occur. I apologize for any typographical errors that were not detected and corrected.     Fisher Hargadon, MD 07/13/2023, 10:43 AM

## 2023-07-13 MED ORDER — WEGOVY 0.5 MG/0.5ML ~~LOC~~ SOAJ: 0.5000 mg | SUBCUTANEOUS | 2 refills | Status: DC

## 2023-07-13 MED ORDER — LEVOTHYROXINE SODIUM 200 MCG PO TABS: 200.0000 ug | ORAL_TABLET | Freq: Every day | ORAL | 0 refills | Status: DC

## 2023-07-13 NOTE — Telephone Encounter (Signed)
 Requested Prescriptions  Pending Prescriptions Disp Refills   Semaglutide -Weight Management (WEGOVY ) 0.5 MG/0.5ML SOAJ 2 mL 2    Sig: Inject 0.5 mg into the skin once a week.     Endocrinology:  Diabetes - GLP-1 Receptor Agonists - semaglutide  Passed - 07/13/2023 10:28 AM      Passed - HBA1C in normal range and within 180 days    Hemoglobin A1C  Date Value Ref Range Status  01/10/2014 5.4 4.2 - 6.3 % Final    Comment:    The American Diabetes Association recommends that a primary goal of therapy should be <7% and that physicians should reevaluate the treatment regimen in patients with HbA1c values consistently >8%.    HB A1C (BAYER DCA - WAIVED)  Date Value Ref Range Status  07/26/2017 4.8 <7.0 % Final    Comment:                                          Diabetic Adult            <7.0                                       Healthy Adult        4.3 - 5.7                                                           (DCCT/NGSP) American Diabetes Association's Summary of Glycemic Recommendations for Adults with Diabetes: Hemoglobin A1c <7.0%. More stringent glycemic goals (A1c <6.0%) may further reduce complications at the cost of increased risk of hypoglycemia.    Hgb A1c MFr Bld  Date Value Ref Range Status  01/16/2023 5.5 4.8 - 5.6 % Final    Comment:             Prediabetes: 5.7 - 6.4          Diabetes: >6.4          Glycemic control for adults with diabetes: <7.0          Passed - Cr in normal range and within 360 days    Creatinine  Date Value Ref Range Status  01/09/2014 1.34 (H) 0.60 - 1.30 mg/dL Final   Creatinine, Ser  Date Value Ref Range Status  01/16/2023 0.84 0.76 - 1.27 mg/dL Final         Passed - Valid encounter within last 6 months    Recent Outpatient Visits           4 weeks ago Morbid obesity [facility])   [place] [facility] Aileen Alexanders, NP   2 months ago Severe recurrent major depression without psychotic features [facility])   [facility] Aileen Alexanders, NP               cariprazine  (VRAYLAR ) 3 MG capsule 30 capsule     Sig: Take 1 capsule (3 mg total) by mouth daily.     Off-Protocol Failed - 07/13/2023 10:28 AM      Failed - Medication not assigned to a protocol, review manually.  Passed - Valid encounter within last 12 months    Recent Outpatient Visits           4 weeks ago Morbid obesity [facility])   [place] [facility] Simms, Mariah Shines, NP   2 months ago Severe recurrent major depression without psychotic features [facility])   [place] [facility] Aileen Alexanders, NP               levothyroxine  (SYNTHROID ) 200 MCG tablet 90 tablet 0    Sig: Take 1 tablet (200 mcg total) by mouth daily.     Endocrinology:  Hypothyroid Agents Passed - 07/13/2023 10:28 AM      Passed - TSH in normal range and within 360 days    TSH  Date Value Ref Range Status  10/19/2022 2.780 0.450 - 4.500 uIU/mL Final         Passed - Valid encounter within last 12 months    Recent Outpatient Visits           4 weeks ago Morbid obesity (HCC)   [place] [facility] Aileen Alexanders, NP   2 months ago Severe recurrent major depression without psychotic features [facility])   [place] [facility] Aileen Alexanders, NP               venlafaxine  XR (EFFEXOR  XR) 75 MG 24 hr capsule 30 capsule     Sig: Take 1 capsule (75 mg total) by mouth daily with breakfast. Begin taking after completion of 37.5 mg dose.     Psychiatry: Antidepressants - SNRI - desvenlafaxine & venlafaxine  Failed - 07/13/2023 10:28 AM      Failed - Lipid Panel in normal range within the last 12 months    Cholesterol, Total  Date Value Ref Range Status  01/16/2023 144 100 - 199 mg/dL Final   Cholesterol  Date Value Ref Range Status  01/10/2014 225 (H) 0 - 200 mg/dL Final   Ldl Cholesterol, Calc  Date Value Ref Range Status  01/10/2014 151 (H) 0 - 100 mg/dL  Final   LDL Chol Calc (NIH)  Date Value Ref Range Status  01/16/2023 77 0 - 99 mg/dL Final   HDL Cholesterol  Date Value Ref Range Status  01/10/2014 44 40 - 60 mg/dL Final   HDL  Date Value Ref Range Status  01/16/2023 41 >39 mg/dL Final   Triglycerides  Date Value Ref Range Status  01/16/2023 149 0 - 149 mg/dL Final  16/10/9602 540 0 - 200 mg/dL Final         Passed - Cr in normal range and within 360 days    Creatinine  Date Value Ref Range Status  01/09/2014 1.34 (H) 0.60 - 1.30 mg/dL Final   Creatinine, Ser  Date Value Ref Range Status  01/16/2023 0.84 0.76 - 1.27 mg/dL Final         Passed - Completed PHQ-2 or PHQ-9 in the last 360 days      Passed - Last BP in normal range    BP Readings from Last 1 Encounters:  06/14/23 116/75         Passed - Valid encounter within last 6 months    Recent Outpatient Visits           4 weeks ago Morbid obesity [facility])   [place] [facility] Aileen Alexanders, NP   2 months ago Severe recurrent major depression without psychotic features (HCC)   [place] [facility]  Aileen Alexanders, NP

## 2023-07-19 ENCOUNTER — Encounter: Payer: Self-pay | Admitting: Nurse Practitioner

## 2023-07-19 ENCOUNTER — Ambulatory Visit (INDEPENDENT_AMBULATORY_CARE_PROVIDER_SITE_OTHER): Payer: MEDICAID | Admitting: Nurse Practitioner

## 2023-07-19 NOTE — Progress Notes (Signed)
 BP 109/74   Pulse 78   Ht 5' 4 (1.626 m)   Wt (!) 416 lb (188.7 kg)   SpO2 93%   BMI 71.41 kg/m    Subjective:    Patient ID: Joe Haley, male    DOB: [birth year], 33 y.o.   MRN: [id number]  HPI: Joe Haley is a 33 y.o. male  Chief Complaint  Patient presents with   Weight Check   WEIGHT GAIN Patient is on Wegovy  0.5mg  weekly.  He has done 1 injection of the 0.5mg  dose.  He has lost 8lbs since last visit.  He is not having any side effects.  Feels like this dose is working well for him.  Duration: years Previous attempts at weight loss: yes- diet and exercise Complications of obesity: depression Peak weight: 430 lb Weight loss goal: 50lbs Weight loss to date: end of the year Requesting obesity pharmacotherapy: yes Current weight loss supplements/medications: yes- topomax Previous weight loss supplements/meds: yes Calories:    Relevant past medical, surgical, family and social history reviewed and updated as indicated. Interim medical history since our last visit reviewed. Allergies and medications reviewed and updated.  Review of Systems  Constitutional:  Negative for unexpected weight change.    Per HPI unless specifically indicated above     Objective:    BP 109/74   Pulse 78   Ht 5' 4 (1.626 m)   Wt (!) 416 lb (188.7 kg)   SpO2 93%   BMI 71.41 kg/m   Wt Readings from Last 3 Encounters:  07/19/23 (!) 416 lb (188.7 kg)  06/14/23 (!) 424 lb (192.3 kg)  04/30/23 (!) 430 lb 12.8 oz (195.4 kg)    Physical Exam Vitals and nursing note reviewed.  Constitutional:      General: He is not in acute distress.    Appearance: Normal appearance. He is obese. He is not ill-appearing, toxic-appearing or diaphoretic.  HENT:     Head: Normocephalic.     Right Ear: External ear normal.     Left Ear: External ear normal.     Nose: Nose normal. No congestion or rhinorrhea.     Mouth/Throat:     Mouth: Mucous membranes are moist.   Eyes:      General:        Right eye: No discharge.        Left eye: No discharge.     Extraocular Movements: Extraocular movements intact.     Conjunctiva/sclera: Conjunctivae normal.     Pupils: Pupils are equal, round, and reactive to light.    Cardiovascular:     Rate and Rhythm: Normal rate and regular rhythm.     Heart sounds: No murmur heard. Pulmonary:     Effort: Pulmonary effort is normal. No respiratory distress.     Breath sounds: Normal breath sounds. No wheezing, rhonchi or rales.  Abdominal:     General: Abdomen is flat. Bowel sounds are normal.   Musculoskeletal:     Cervical back: Normal range of motion and neck supple.   Skin:    General: Skin is warm and dry.     Capillary Refill: Capillary refill takes less than 2 seconds.   Neurological:     General: No focal deficit present.     Mental Status: He is alert and oriented to person, place, and time.   Psychiatric:        Mood and Affect: Mood normal.        Behavior: Behavior  normal.        Thought Content: Thought content normal.        Judgment: Judgment normal.     Results for orders placed or performed in visit on 01/16/23  CBC w/Diff   Collection Time: 01/16/23 11:39 AM  Result Value Ref Range   WBC 11.1 (H) 3.4 - 10.8 x10E3/uL   RBC 5.45 4.14 - 5.80 x10E6/uL   Hemoglobin 16.0 13.0 - 17.7 g/dL   Hematocrit 16.1 09.6 - 51.0 %   MCV 88 79 - 97 fL   MCH 29.4 26.6 - 33.0 pg   MCHC 33.4 31.5 - 35.7 g/dL   RDW 04.5 40.9 - 81.1 %   Platelets 333 150 - 450 x10E3/uL   Neutrophils 64 Not Estab. %   Lymphs 24 Not Estab. %   Monocytes 7 Not Estab. %   Eos 3 Not Estab. %   Basos 1 Not Estab. %   Neutrophils Absolute 7.1 (H) 1.4 - 7.0 x10E3/uL   Lymphocytes Absolute 2.6 0.7 - 3.1 x10E3/uL   Monocytes Absolute 0.8 0.1 - 0.9 x10E3/uL   EOS (ABSOLUTE) 0.4 0.0 - 0.4 x10E3/uL   Basophils Absolute 0.1 0.0 - 0.2 x10E3/uL   Immature Granulocytes 1 Not Estab. %   Immature Grans (Abs) 0.1 0.0 - 0.1 x10E3/uL   Creatinine   Collection Time: 01/16/23 11:39 AM  Result Value Ref Range   Creatinine, Ser 0.84 0.76 - 1.27 mg/dL   eGFR 914 >78 GN/FAO/1.30  Hepatic function panel   Collection Time: 01/16/23 11:39 AM  Result Value Ref Range   Total Protein 7.1 6.0 - 8.5 g/dL   Albumin 4.7 4.1 - 5.1 g/dL   Bilirubin Total 0.7 0.0 - 1.2 mg/dL   Bilirubin, Direct 8.65 0.00 - 0.40 mg/dL   Alkaline Phosphatase 83 44 - 121 IU/L   AST 21 0 - 40 IU/L   ALT 26 0 - 44 IU/L  HgB A1c   Collection Time: 01/16/23 11:39 AM  Result Value Ref Range   Hgb A1c MFr Bld 5.5 4.8 - 5.6 %   Est. average glucose Bld gHb Est-mCnc 111 mg/dL  Prolactin   Collection Time: 01/16/23 11:39 AM  Result Value Ref Range   Prolactin 14.7 3.9 - 22.7 ng/mL  Lipid Profile   Collection Time: 01/16/23 11:39 AM  Result Value Ref Range   Cholesterol, Total 144 100 - 199 mg/dL   Triglycerides 784 0 - 149 mg/dL   HDL 41 >69 mg/dL   VLDL Cholesterol Cal 26 5 - 40 mg/dL   LDL Chol Calc (NIH) 77 0 - 99 mg/dL   Chol/HDL Ratio 3.5 0.0 - 5.0 ratio      Assessment & Plan:   Problem List Items Addressed This Visit       Other   Morbid obesity (HCC) - Primary   Chronic.  Tolerating Wegovy  it well.  Has lost 8lbs since last visit.  Total weight loss of 15lbs since March.  Continue with Wegovy  0.5mg  for the 3 next months.  Follow up in 3 months.  Call sooner if concerns arise.          Follow up plan: Return in about 3 months (around 10/19/2023) for Weight Managment.

## 2023-07-19 NOTE — Assessment & Plan Note (Signed)
 Chronic.  Tolerating Wegovy  it well.  Has lost 8lbs since last visit.  Total weight loss of 15lbs since March.  Continue with Wegovy  0.5mg  for the 3 next months.  Follow up in 3 months.  Call sooner if concerns arise.

## 2023-08-09 ENCOUNTER — Ambulatory Visit (INDEPENDENT_AMBULATORY_CARE_PROVIDER_SITE_OTHER): Payer: MEDICAID | Admitting: Professional Counselor

## 2023-08-09 DIAGNOSIS — F411 Generalized anxiety disorder: Secondary | ICD-10-CM | POA: Diagnosis not present

## 2023-08-09 DIAGNOSIS — Z634 Disappearance and death of family member: Secondary | ICD-10-CM

## 2023-08-09 DIAGNOSIS — F331 Major depressive disorder, recurrent, moderate: Secondary | ICD-10-CM

## 2023-08-09 NOTE — Progress Notes (Signed)
 THERAPIST PROGRESS NOTE  Session Time: 10:55 AM - 11:46 AM   Participation Level: Active  Behavioral Response: Casual, Alert, Dysphoric  Type of Therapy: Individual Therapy  Treatment Goals addressed: Active OP Depression  LTG: I just want to be more motivated. Have a better outlook. I'd like to be in a better head space so I can start working again and be better health wise.  (Progressing)    Start:  02/15/23    Expected End:  02/14/24     Goal Note Reviewed 08/09/23 I'd say we've progressed a little bit on that, taken steps forward towards that goal.  STG: Increase coping skills to manage depression and improve ability to perform daily activities (Progressing)   Goal Note Reviewed 08/09/23 I'd say I've gotten progress on it for sure. I'm out doing stuff and working out, exercising, working towards that goal. I guess when I'm able to just do stuff without thinking about it as much.  STG: Improve thinking patterns AEB identifying and restructuring cognitive distortions over the next 12 weeks.  (Not Progressing)   Goal Note Reviewed 08/09/23 I guess not the best since we were just talking about that. It seems like I'm at a stand still right now. Discussed adding gratitude journal and mindfulness to help with mindset.  STG: Reduce unhealthy coping mechanisms such as stress eating by implementing positive replacement behaviors and other emotion regulation skills over the next 12 weeks (Progressing)   Goal Note Reviewed 08/09/23 The medicine is really taking care of that honestly. It definitely has been improved.   ProgressTowards Goals: Progressing  Interventions: CBT, Motivational Interviewing, and Supportive  Summary: Joe Haley is a 33 y.o. male who presents with a history of anxiety, depression, and bereavement. He appeared alert and oriented x5. He stated things are mostly going well. He recently went on a family trip and visited his grandmother. He noted it was  bitter sweet as she's got dementia. Joe Haley has continued to hike, play cards, and started going to the gym. He has struggled with writing a letter to his brother. He noted thoughts interfering with feeling his emotions. He also noted continued struggle with feeling like a failure. He was receptive to homework assignments to aid in cognitive restructuring and mindfulness. Joe Haley noted progress on goals and areas for continued improvement. He scored mild on anxiety and depression screenings today.     08/09/2023   11:43 AM 07/12/2023    9:34 AM 06/14/2023    9:23 AM 05/03/2023   12:58 PM  GAD 7 : Generalized Anxiety Score  Nervous, Anxious, on Edge 0 1 1 2   Control/stop worrying 0 1 1 2   Worry too much - different things 1 1 1 2   Trouble relaxing 3 3 1 2   Restless 0 0 0 0  Easily annoyed or irritable 0 0 0 0  Afraid - awful might happen 1 2 1  0  Total GAD 7 Score 5 8 5 8   Anxiety Difficulty Very difficult Very difficult Very difficult Very difficult      08/09/2023   11:44 AM 07/12/2023    9:33 AM 06/14/2023    9:23 AM  Depression screen PHQ 2/9  Decreased Interest 2 3 3   Down, Depressed, Hopeless 1 1 1   PHQ - 2 Score 3 4 4   Altered sleeping 1 1 3   Tired, decreased energy 1 3 3   Change in appetite 0 1 1  Feeling bad or failure about yourself  2 3 1   Trouble  concentrating 2 3 3   Moving slowly or fidgety/restless 0 0 0  Suicidal thoughts 0 1 1  PHQ-9 Score 9 16 16   Difficult doing work/chores Very difficult Very difficult Very difficult   Therapist Response: Conducted session with Joe Haley. Began session with check-in/update since previous session. Utilized empathetic and reflective listening. Used open-ended questions to facilitate discussion and summarized thoughts/feelings. Normalized resistance to processing grief and identified Joe Haley's thoughts that contribute to this. Explored ways to challenge feeling like a failure (gratitude journal, checking the facts, zooming out and looking at big  picture). Noted mindfulness exercises to assist with being more grounded and focused. Reviewed treatment plan with input from Joe Haley on current strengths, needs, and progress towards goals. Administered GAD7 and PHQ9 screening. Scheduled additional appointment and concluded session.   Suicidal/Homicidal: No  Plan: Return again in 2 weeks.  Diagnosis: Major depressive disorder, recurrent episode, moderate (HCC)  GAD (generalized anxiety disorder)  Bereavement  Collaboration of Care: Medication Management AEB chart review  Patient/Guardian was advised Release of Information must be obtained prior to any record release in order to collaborate their care with an outside provider. Patient/Guardian was advised if they have not already done so to contact the registration department to sign all necessary forms in order for us  to release information regarding their care.   Consent: Patient/Guardian gives verbal consent for treatment and assignment of benefits for services provided during this visit. Patient/Guardian expressed understanding and agreed to proceed.   Almarie JONETTA Ligas, [facility] 08/09/2023

## 2023-08-28 ENCOUNTER — Ambulatory Visit (INDEPENDENT_AMBULATORY_CARE_PROVIDER_SITE_OTHER): Payer: MEDICAID | Admitting: Professional Counselor

## 2023-08-28 DIAGNOSIS — F3341 Major depressive disorder, recurrent, in partial remission: Secondary | ICD-10-CM

## 2023-08-28 NOTE — Progress Notes (Signed)
  THERAPIST PROGRESS NOTE  Session Time: 9:00 AM - 9:20 AM   Participation Level: Active  Behavioral Response: Casual, Alert, Euthymic  Type of Therapy: Individual Therapy  Treatment Goals addressed: Active OP Depression   LTG: I just want to be more motivated. Have a better outlook. I'd like to be in a better head space so I can start working again and be better health wise.  (Progressing)    Start:  02/15/23    Expected End:  02/14/24     STG: Increase coping skills to manage depression and improve ability to perform daily activities (Completed/Met)  Goal Note Reviewed 08/28/23 - I'm working on that. Obtaining employment is still a goal.   STG: Improve thinking patterns AEB identifying and restructuring cognitive distortions over the next 12 weeks.  (Completed/Met)   STG: Reduce unhealthy coping mechanisms such as stress eating by implementing positive replacement behaviors and other emotion regulation skills over the next 12 weeks (Progressing)  Goal Note Reviewed 08/28/23 I've gotten better with that.   ProgressTowards Goals: Progressing  Interventions: Motivational Interviewing and Supportive  Summary: Joe Haley is a 33 y.o. male who presents with a history of anxiety, depression, and bereavement. He appeared alert and oriented x5. He reported things are going well. He is going to the gym 3x a week and continues to hike occasionally. He attends card tournaments and has won since his last session. Joe Haley celebrated his birthday with family and his close friend. His best friend continues to struggle with alcoholism but Joe Haley is able to regulate his own thoughts/feelings around this more. He noted progress on goals, including some goals that have been achieved. Joe Haley was in agreement for maintenance session and plan for discharge should things continue to go well.   Therapist Response: Conducted session with Joe Haley. Began session with check-in/update since previous session.  Utilized empathetic and reflective listening. Used open-ended questions to facilitate discussion and summarized thoughts/feelings. Reviewed treatment plan with input from Joe Haley on current strengths, needs, and progress towards goals. Discussed plan for discharge after next maintenance appointment. Scheduled additional appointment and concluded session.   Suicidal/Homicidal: No  Plan: Return again in 5 weeks.  Diagnosis: Recurrent major depressive disorder, in partial remission (HCC)  Collaboration of Care: Medication Management AEB chart review  Patient/Guardian was advised Release of Information must be obtained prior to any record release in order to collaborate their care with an outside provider. Patient/Guardian was advised if they have not already done so to contact the registration department to sign all necessary forms in order for us  to release information regarding their care.   Consent: Patient/Guardian gives verbal consent for treatment and assignment of benefits for services provided during this visit. Patient/Guardian expressed understanding and agreed to proceed.   Joe Haley, [facility] 08/28/2023

## 2023-09-13 ENCOUNTER — Ambulatory Visit: Payer: MEDICAID | Admitting: Professional Counselor

## 2023-09-18 ENCOUNTER — Ambulatory Visit (INDEPENDENT_AMBULATORY_CARE_PROVIDER_SITE_OTHER): Payer: MEDICAID | Admitting: Psychiatry

## 2023-09-18 ENCOUNTER — Encounter: Payer: Self-pay | Admitting: Psychiatry

## 2023-09-18 VITALS — BP 124/86 | HR 86 | Temp 97.6°F | Ht 64.0 in | Wt >= 6400 oz

## 2023-09-18 DIAGNOSIS — F3342 Major depressive disorder, recurrent, in full remission: Secondary | ICD-10-CM

## 2023-09-18 DIAGNOSIS — F411 Generalized anxiety disorder: Secondary | ICD-10-CM | POA: Diagnosis not present

## 2023-09-18 DIAGNOSIS — Z634 Disappearance and death of family member: Secondary | ICD-10-CM

## 2023-09-18 NOTE — Progress Notes (Unsigned)
 BH MD OP Progress Note  09/18/2023 3:57 PM Joe Haley  MRN:  [id number]  Chief Complaint:  Chief Complaint  Patient presents with   Follow-up   Depression   Anxiety   Medication Refill   Discussed the use of AI scribe software for clinical note transcription with the patient, who gave verbal consent to proceed.  History of Present Illness Joe Haley is a 33 year old Caucasian male, unemployed, lives in [place] with his parents, has a history of MDD, GAD, bereavement, hypothyroidism, morbid obesity, hyperlipidemia was evaluated in office today for a follow-up appointment.  He reports stable mood and states he feels pretty good overall. Over the past 2 weeks, he describes no episodes of feeling down, depressed, or hopeless. He denies any significant loss of interest or pleasure in activities, though he reports that on some days he has less interest. He continues to enjoy working out, keeping his room clean, completing chores, and spending time with his friend. He reports sleeping through the night and denies any current sleep difficulties.  Regarding anxiety, he describes it as manageable and states he has not experienced panic attacks or significant anxiety symptoms recently. He reports coping with grief and recent life changes has been going well for him.  He denies suicidal thoughts, thoughts of not wanting to be alive, or thoughts of hurting himself in the past month.  Denies any perceptual disturbances or homicidality.  He also denies recent binge eating or purging behaviors, stating he has not engaged in these behaviors in a while.  He reports currently taking Vraylar  3 mg, venlafaxine  150 mg, and trazodone  50 mg at bedtime. He confirms he has an adequate supply of these medications and that he is taking them as prescribed. He also reports receiving Wegovy  injections for weight management, and he recently increased the dose to approximately 0.5 mg. He continues to make  ongoing efforts to eat healthier and reduce portion sizes, and he believes he has lost weight, though he has not had access to a scale to confirm this.    Visit Diagnosis:    ICD-10-CM   1. Recurrent major depressive disorder, in full remission (HCC)  F33.42     2. GAD (generalized anxiety disorder)  F41.1     3. Bereavement  Z63.4       Past Psychiatric History: I have reviewed past psychiatric history from progress note on 01/08/2023.  Patient with history of suicide attempts x 2 in the past.  Patient completed PHP program at [facility] dated 06/13/2023.  Past Medical History:  Past Medical History:  Diagnosis Date   Acanthosis nigricans    Hyperlipidemia    Hypothyroidism    Ibuprofen  overdose    Major depressive disorder    Obesity    Restless leg syndrome    History reviewed. No pertinent surgical history.  Family Psychiatric History: I have reviewed family psychiatric history from progress note on 01/08/2023.  Family History:  Family History  Problem Relation Age of Onset   Depression Sister    Depression Maternal Grandmother    Diabetes Maternal Grandmother     Social History: I have reviewed social history from progress note on 01/08/2023. Social History   Socioeconomic History   Marital status: Single    Spouse name: Not on file   Number of children: Not on file   Years of education: Not on file   Highest education level: High school graduate  Occupational History   Not on file  Tobacco  Use   Smoking status: Never   Smokeless tobacco: Never  Vaping Use   Vaping status: Never Used  Substance and Sexual Activity   Alcohol use: No   Drug use: Yes    Types: Marijuana    Comment: occasional   Sexual activity: Not Currently  Other Topics Concern   Not on file  Social History Narrative   Not on file   Social Drivers of Health   Financial Resource Strain: High Risk (06/14/2023)   Overall Financial Resource Strain (CARDIA)    Difficulty of Paying  Living Expenses: Hard  Food Insecurity: No Food Insecurity (06/14/2023)   Hunger Vital Sign    Worried About Running Out of Food in the Last Year: Never true    Ran Out of Food in the Last Year: Never true  Transportation Needs: No Transportation Needs (06/14/2023)   PRAPARE - Administrator, Civil Service (Medical): No    Lack of Transportation (Non-Medical): No  Physical Activity: Inactive (06/14/2023)   Exercise Vital Sign    Days of Exercise per Week: 0 days    Minutes of Exercise per Session: 0 min  Stress: Stress Concern Present (06/14/2023)   [employer] of Occupational Health - Occupational Stress Questionnaire    Feeling of Stress : Very much  Social Connections: Socially Isolated (06/14/2023)   Social Connection and Isolation Panel    Frequency of Communication with Friends and Family: Once a week    Frequency of Social Gatherings with Friends and Family: Once a week    Attends Religious Services: Never    Database administrator or Organizations: No    Attends Engineer, structural: Never    Marital Status: Never married    Allergies: No Known Allergies  Metabolic Disorder Labs: Lab Results  Component Value Date   HGBA1C 5.5 01/16/2023   MPG 88 03/29/2016   Lab Results  Component Value Date   PROLACTIN 14.7 01/16/2023   PROLACTIN 77.0 (H) 03/29/2016   Lab Results  Component Value Date   CHOL 144 01/16/2023   TRIG 149 01/16/2023   HDL 41 01/16/2023   CHOLHDL 3.5 01/16/2023   VLDL 36 03/29/2016   LDLCALC 77 01/16/2023   LDLCALC 66 04/12/2021   Lab Results  Component Value Date   TSH 2.780 10/19/2022   TSH 0.718 10/31/2021    Therapeutic Level Labs: No results found for: LITHIUM No results found for: VALPROATE No results found for: CBMZ  Current Medications: Current Outpatient Medications  Medication Sig Dispense Refill   cariprazine  (VRAYLAR ) 3 MG capsule Take 1 capsule (3 mg total) by mouth daily. 30 capsule 1    levothyroxine  (SYNTHROID ) 200 MCG tablet Take 1 tablet (200 mcg total) by mouth daily. 90 tablet 0   Semaglutide -Weight Management (WEGOVY ) 0.5 MG/0.5ML SOAJ Inject 0.5 mg into the skin once a week. 2 mL 2   traZODone  (DESYREL ) 50 MG tablet Take 1 tablet (50 mg total) by mouth at bedtime. 30 tablet 1   venlafaxine  XR (EFFEXOR  XR) 150 MG 24 hr capsule Take 1 capsule (150 mg total) by mouth daily with breakfast. Stop the 75 mg daily. 30 capsule 1   No current facility-administered medications for this visit.     Musculoskeletal: Strength & Muscle Tone: within normal limits Gait & Station: normal Patient leans: N/A  Psychiatric Specialty Exam: Review of Systems  Psychiatric/Behavioral:  Positive for dysphoric mood.     Blood pressure 124/86, pulse 86, temperature 97.6 F (36.4  C), temperature source Temporal, height 5' 4 (1.626 m), weight (!) 418 lb 9.6 oz (189.9 kg), SpO2 97%.Body mass index is 71.85 kg/m.  General Appearance: Casual  Eye Contact:  Fair  Speech:  Clear and Coherent  Volume:  Normal  Mood:  Depressed improving  Affect:  Appropriate  Thought Process:  Goal Directed and Descriptions of Associations: Intact  Orientation:  Full (Time, Place, and Person)  Thought Content: Logical   Suicidal Thoughts:  No  Homicidal Thoughts:  No  Memory:  Immediate;   Fair Recent;   Fair Remote;   Fair  Judgement:  Fair  Insight:  Fair  Psychomotor Activity:  Normal  Concentration:  Concentration: Fair and Attention Span: Fair  Recall:  [employer] of Knowledge: Fair  Language: Fair  Akathisia:  No  Handed:  Right  AIMS (if indicated): done  Assets:  Communication Skills Desire for Improvement Housing Social Support Transportation  ADL's:  Intact  Cognition: WNL  Sleep:  Fair   Screenings: Geneticist, molecular Office Visit from 07/12/2023 in [facility] Office Visit from 05/03/2023 in [facility]  AIMS Total Score 0 0   AUDIT    Flowsheet Row Counselor from 01/29/2023 in [facility]mission (Discharged) from 03/28/2016 in [facility]  Alcohol Use Disorder Identification Test Final Score (AUDIT) 3 1   GAD-7    Flowsheet Row Office Visit from 09/18/2023 in [facility] Counselor from 08/09/2023 in [facility] Office Visit from 07/12/2023 in [facility] Office Visit from 06/14/2023 in [facility] Office Visit from 05/03/2023 in [facility]  Total GAD-7 Score 6 5 8 5 8    PHQ2-9    Flowsheet Row Office Visit from 09/18/2023 in [facility] Counselor from 08/09/2023 in [facility] Office Visit from 07/12/2023 in [facility] Office Visit from 06/14/2023 in [facility] Counselor from 05/22/2023 in [facility]  PHQ-2 Total Score 1 3 4 4 2   PHQ-9 Total Score -- 9 16 16 15    Flowsheet Row Office Visit from 09/18/2023 in [facility] Office Visit from 07/12/2023 in [facility] Counselor from 05/22/2023 in [facility]  C-SSRS RISK CATEGORY Moderate Risk Moderate Risk Moderate Risk     Assessment and Plan: Wofford Stratton is a 33 year old Caucasian male with history of depression, anxiety was evaluated in office today.  Discussed assessment and plan as noted below.  MDD in remission Currently denies any significant depression symptoms, overall well managed on current medication regimen. Continue Venlafaxine  extended release 150 mg daily in the morning Continue  Vraylar  3 mg daily Continue Trazodone  50 mg at bedtime We will consider reducing the dosage of Vraylar  in the future if depression continues to be stable. Continue follow-up with Ms. Rhae for psychotherapy.  Generalized anxiety disorder-improving Currently denies any significant anxiety symptoms. Continue CBT Continue Venlafaxine  extended release 150 mg daily  Bereavement-improving Currently denies any significant problems related to grief, coping better. Continue psychotherapy sessions.  Follow-up Follow-up in clinic in 3 months or sooner if needed.  Collaboration of Care: Collaboration of Care: Referral or follow-up with counselor/therapist AEB reviewed notes per Ms. Veva dated  08/28/2023-patient continues to be engaged in motivational interviewing and supportive psychotherapy.  Patient/Guardian was advised Release of Information must be obtained prior to any record release in order to collaborate their care with an outside provider. Patient/Guardian was advised if they have not already done so to contact the registration department to sign all necessary forms in order for us  to release information regarding their care.   Consent: Patient/Guardian gives verbal consent for treatment and assignment of benefits for services provided during this visit. Patient/Guardian expressed understanding and agreed to proceed.   This note was generated in part or whole with voice recognition software. Voice recognition is usually quite accurate but there are transcription errors that can and very often do occur. I apologize for any typographical errors that were not detected and corrected.    Mauriah Mcmillen, MD 09/18/2023, 3:57 PM

## 2023-10-04 ENCOUNTER — Ambulatory Visit: Payer: MEDICAID | Admitting: Professional Counselor

## 2023-10-04 DIAGNOSIS — F3342 Major depressive disorder, recurrent, in full remission: Secondary | ICD-10-CM

## 2023-10-04 NOTE — Progress Notes (Signed)
  THERAPIST PROGRESS NOTE  Session Time: 9:00 AM - 9:30 AM   Participation Level: Active  Behavioral Response: Casual, Alert, Euthymic  Type of Therapy: Individual Therapy  Treatment Goals addressed: Active OP Depression  LTG: I just want to be more motivated. Have a better outlook. I'd like to be in a better head space so I can start working again and be better health wise.  (Progressing)    Start:  02/15/23    Expected End:  02/14/24     Goal Note [name] has been more motivated and active over the last couple of months. He has started exercising regularly and socializing. He still doesn't feel ready to work but believes he will be able to in the next few months.   STG: Increase coping skills to manage depression and improve ability to perform daily activities (Completed/Met)   STG: Improve thinking patterns AEB identifying and restructuring cognitive distortions over the next 12 weeks.  (Completed/Met)   STG: Reduce unhealthy coping mechanisms such as stress eating by implementing positive replacement behaviors and other emotion regulation skills over the next 12 weeks (Completed/Met)   ProgressTowards Goals: Met  Interventions: Motivational Interviewing and Supportive  Summary: Declyn Mark Behlke is a 33 y.o. male who presents with a history of anxiety and depression. He appeared alert and oriented x5. He reported things are going well. He has been more active recently, increasing gym days to 5 days a week from 2 days, in addition to hiking/walking. [name] continues to play cards weekly. He denies any depression or anxiety symptoms. He has been helping watch the animals while he parents went on vacation. He still doesn't feel ready to work but plans to be ready in a few months. 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 [name] has cut off soda and sugar this month and hopes it will help increase his weight loss. He feels stable and able to discharge from therapy  at this time. He will continue with medication management and understands he can resume therapy in the future if needed.   Therapist Response: Conducted session with [name]. Began session with check-in/update since previous session. Utilized empathetic and reflective listening. Used open-ended questions to facilitate discussion and summarized [name]'s thoughts/feelings. Discussed mental health status and praised [name] for his progress and continued motivation to maintain progress. Planned for discharge and reminded [name] he may return should symptoms return/worsen.   Suicidal/Homicidal: No  Plan: Successful discharge from OPT, Will continue with med mgmt  Diagnosis: Recurrent major depressive disorder, in full remission (HCC)  Collaboration of Care: Medication Management AEB chart review  Patient/Guardian was advised Release of Information must be obtained prior to any record release in order to collaborate their care with an outside provider. Patient/Guardian was advised if they have not already done so to contact the registration department to sign all necessary forms in order for us  to release information regarding their care.   Consent: Patient/Guardian gives verbal consent for treatment and assignment of benefits for services provided during this visit. Patient/Guardian expressed understanding and agreed to proceed.   Almarie JONETTA Ligas, [facility] 10/04/2023

## 2023-10-07 ENCOUNTER — Other Ambulatory Visit: Payer: Self-pay | Admitting: Nurse Practitioner

## 2023-10-08 NOTE — Telephone Encounter (Signed)
 Requested Prescriptions  Pending Prescriptions Disp Refills   levothyroxine  (SYNTHROID ) 200 MCG tablet [Pharmacy Med Name: Levothyroxine  Sodium 200 MCG Oral Tablet] 90 tablet 2    Sig: Take 1 tablet by mouth once daily     Endocrinology:  Hypothyroid Agents Passed - 10/08/2023  5:39 PM      Passed - TSH in normal range and within 360 days    TSH  Date Value Ref Range Status  10/19/2022 2.780 0.450 - 4.500 uIU/mL Final         Passed - Valid encounter within last 12 months    Recent Outpatient Visits           2 months ago Morbid obesity [facility])   [facility] Melvin Pao, NP   3 months ago Morbid obesity [facility])   [facility] Melvin Pao, NP   5 months ago Severe recurrent major depression without psychotic features [facility])   [facility] Melvin Pao, NP

## 2023-10-15 ENCOUNTER — Other Ambulatory Visit: Payer: Self-pay | Admitting: Psychiatry

## 2023-10-15 DIAGNOSIS — F331 Major depressive disorder, recurrent, moderate: Secondary | ICD-10-CM

## 2023-10-19 ENCOUNTER — Encounter: Payer: Self-pay | Admitting: Nurse Practitioner

## 2023-10-19 ENCOUNTER — Ambulatory Visit (INDEPENDENT_AMBULATORY_CARE_PROVIDER_SITE_OTHER): Payer: MEDICAID | Admitting: Nurse Practitioner

## 2023-10-19 DIAGNOSIS — M25562 Pain in left knee: Secondary | ICD-10-CM

## 2023-10-19 DIAGNOSIS — G8929 Other chronic pain: Secondary | ICD-10-CM

## 2023-10-19 DIAGNOSIS — M25561 Pain in right knee: Secondary | ICD-10-CM | POA: Diagnosis not present

## 2023-10-19 MED ORDER — WEGOVY 1 MG/0.5ML ~~LOC~~ SOAJ: 1.0000 mg | SUBCUTANEOUS | 2 refills | Status: AC

## 2023-10-19 NOTE — Progress Notes (Signed)
 BP 119/74   Pulse 78   Temp (!) 97.5 F (36.4 C) (Oral)   Ht 5' 11.1 (1.806 m)   Wt (!) 415 lb 3.2 oz (188.3 kg)   SpO2 97%   BMI 57.75 kg/m    Subjective:    Patient ID: Joe Haley, male    DOB: 1990-03-04, 33 y.o.   MRN: [id number]  HPI: Joe Haley is a 33 y.o. male  Chief Complaint  Patient presents with   Depression   Obesity   Knee Pain    Patient states he has been experiencing bilateral knee pain off and on for the last 4 to 5 years- states it is gradually getting worse. States the pain has been gradually getting worse. States that he mainly feels dull and aching feelings but also get sharp shooting pains on occasion    WEIGHT GAIN Patient is on Wegovy  0.5mg  weekly.  He has lost 1lbs since last visit.  He is ready to go to 1mg  on the medication.   Previous attempts at weight loss: yes- diet and exercise Complications of obesity: depression Peak weight: 430 lb Weight loss goal: 50lbs Weight loss to date: end of the year Requesting obesity pharmacotherapy: yes Current weight loss supplements/medications: yes- topomax Previous weight loss supplements/meds: yes Calories:   KNEE PAIN Duration: 4-5 years Involved knee: bilateral Mechanism of injury: unknown Location:diffuse Onset: gradual Severity: 4/10  Quality:  dull and aching Frequency: intermittent Radiation: no Aggravating factors: weight bearing and walking  Alleviating factors: ice, rest and elevated Status: worse Treatments attempted: rest and ice  Relief with NSAIDs?:  No NSAIDs Taken Weakness with weight bearing or walking: yes Sensation of giving way: yes Locking: yes Popping: no Bruising: no Swelling: no Redness: no Paresthesias/decreased sensation: no Fevers: no    Relevant past medical, surgical, family and social history reviewed and updated as indicated. Interim medical history since our last visit reviewed. Allergies and medications reviewed and updated.  Review  of Systems  Constitutional:  Negative for unexpected weight change.  Musculoskeletal:        Bilateral knee pain    Per HPI unless specifically indicated above     Objective:    BP 119/74   Pulse 78   Temp (!) 97.5 F (36.4 C) (Oral)   Ht 5' 11.1 (1.806 m)   Wt (!) 415 lb 3.2 oz (188.3 kg)   SpO2 97%   BMI 57.75 kg/m   Wt Readings from Last 3 Encounters:  10/19/23 (!) 415 lb 3.2 oz (188.3 kg)  07/19/23 (!) 416 lb (188.7 kg)  06/14/23 (!) 424 lb (192.3 kg)    Physical Exam Vitals and nursing note reviewed.  Constitutional:      General: He is not in acute distress.    Appearance: Normal appearance. He is obese. He is not ill-appearing, toxic-appearing or diaphoretic.  HENT:     Head: Normocephalic.     Right Ear: External ear normal.     Left Ear: External ear normal.     Nose: Nose normal. No congestion or rhinorrhea.     Mouth/Throat:     Mouth: Mucous membranes are moist.  Eyes:     General:        Right eye: No discharge.        Left eye: No discharge.     Extraocular Movements: Extraocular movements intact.     Conjunctiva/sclera: Conjunctivae normal.     Pupils: Pupils are equal, round, and reactive to light.  Cardiovascular:     Rate and Rhythm: Normal rate and regular rhythm.     Heart sounds: No murmur heard. Pulmonary:     Effort: Pulmonary effort is normal. No respiratory distress.     Breath sounds: Normal breath sounds. No wheezing, rhonchi or rales.  Abdominal:     General: Abdomen is flat. Bowel sounds are normal.  Musculoskeletal:     Cervical back: Normal range of motion and neck supple.  Skin:    General: Skin is warm and dry.     Capillary Refill: Capillary refill takes less than 2 seconds.  Neurological:     General: No focal deficit present.     Mental Status: He is alert and oriented to person, place, and time.  Psychiatric:        Mood and Affect: Mood normal.        Behavior: Behavior normal.        Thought Content: Thought  content normal.        Judgment: Judgment normal.     Results for orders placed or performed in visit on 01/16/23  CBC w/Diff   Collection Time: 01/16/23 11:39 AM  Result Value Ref Range   WBC 11.1 (H) 3.4 - 10.8 x10E3/uL   RBC 5.45 4.14 - 5.80 x10E6/uL   Hemoglobin 16.0 13.0 - 17.7 g/dL   Hematocrit 52.0 62.4 - 51.0 %   MCV 88 79 - 97 fL   MCH 29.4 26.6 - 33.0 pg   MCHC 33.4 31.5 - 35.7 g/dL   RDW 86.2 88.3 - 84.5 %   Platelets 333 150 - 450 x10E3/uL   Neutrophils 64 Not Estab. %   Lymphs 24 Not Estab. %   Monocytes 7 Not Estab. %   Eos 3 Not Estab. %   Basos 1 Not Estab. %   Neutrophils Absolute 7.1 (H) 1.4 - 7.0 x10E3/uL   Lymphocytes Absolute 2.6 0.7 - 3.1 x10E3/uL   Monocytes Absolute 0.8 0.1 - 0.9 x10E3/uL   EOS (ABSOLUTE) 0.4 0.0 - 0.4 x10E3/uL   Basophils Absolute 0.1 0.0 - 0.2 x10E3/uL   Immature Granulocytes 1 Not Estab. %   Immature Grans (Abs) 0.1 0.0 - 0.1 x10E3/uL  Creatinine   Collection Time: 01/16/23 11:39 AM  Result Value Ref Range   Creatinine, Ser 0.84 0.76 - 1.27 mg/dL   eGFR 880 >40 fO/fpw/8.26  Hepatic function panel   Collection Time: 01/16/23 11:39 AM  Result Value Ref Range   Total Protein 7.1 6.0 - 8.5 g/dL   Albumin 4.7 4.1 - 5.1 g/dL   Bilirubin Total 0.7 0.0 - 1.2 mg/dL   Bilirubin, Direct 9.79 0.00 - 0.40 mg/dL   Alkaline Phosphatase 83 44 - 121 IU/L   AST 21 0 - 40 IU/L   ALT 26 0 - 44 IU/L  HgB A1c   Collection Time: 01/16/23 11:39 AM  Result Value Ref Range   Hgb A1c MFr Bld 5.5 4.8 - 5.6 %   Est. average glucose Bld gHb Est-mCnc 111 mg/dL  Prolactin   Collection Time: 01/16/23 11:39 AM  Result Value Ref Range   Prolactin 14.7 3.9 - 22.7 ng/mL  Lipid Profile   Collection Time: 01/16/23 11:39 AM  Result Value Ref Range   Cholesterol, Total 144 100 - 199 mg/dL   Triglycerides 850 0 - 149 mg/dL   HDL 41 >60 mg/dL   VLDL Cholesterol Cal 26 5 - 40 mg/dL   LDL Chol Calc (NIH) 77 0 - 99 mg/dL  Chol/HDL Ratio 3.5 0.0 - 5.0 ratio       Assessment & Plan:   Problem List Items Addressed This Visit       Other   Morbid obesity (HCC) - Primary   Chronic.  Tolerating Wegovy  it well.  Has lost 1lbs since last visit.  Total weight loss of 15lbs since March.  Will increase Wegovy  1mg  for the 3 next months.  Follow up in 3 months.  Call sooner if concerns arise.       Relevant Medications   semaglutide -weight management (WEGOVY ) 1 MG/0.5ML SOAJ SQ injection   Other Visit Diagnoses       Chronic pain of both knees       Will refer to ortho for further evaluation and management.   Relevant Orders   Ambulatory referral to Orthopedic Surgery          Follow up plan: Return in about 3 months (around 01/18/2024) for Weight Managment.

## 2023-10-19 NOTE — Assessment & Plan Note (Signed)
 Chronic.  Tolerating Wegovy  it well.  Has lost 1lbs since last visit.  Total weight loss of 15lbs since March.  Will increase Wegovy  1mg  for the 3 next months.  Follow up in 3 months.  Call sooner if concerns arise.

## 2023-11-13 ENCOUNTER — Telehealth: Payer: Self-pay

## 2023-11-13 ENCOUNTER — Other Ambulatory Visit (HOSPITAL_COMMUNITY): Payer: Self-pay

## 2023-11-13 NOTE — Telephone Encounter (Signed)
 Pharmacy Patient Advocate Encounter   Received notification from Onbase that prior authorization for Wegovy  0.5mg /0.61ml Pen is required/requested.   Insurance verification completed.   The patient is insured through [employer] [place] MEDICA[employer].   Per test claim: Effective October 1st, Medicaid will discontinue coverage of GLP1 medications for weight loss (such as Wegovy  and Zepbound), unless the patient has a documented history of a heart attack or stroke. Zepbound will continue to be covered only for patients with moderate to severe sleep apnea (AHI 15-30) and a BMI greater than 40. Because of this change, the prior authorization team will not be submitting new PA requests for GLP1 medications prescribed for weight loss, as patients will be unable to continue therapy under Medicaid coverage.

## 2023-11-15 ENCOUNTER — Other Ambulatory Visit (HOSPITAL_COMMUNITY): Payer: Self-pay

## 2023-11-19 ENCOUNTER — Other Ambulatory Visit (HOSPITAL_COMMUNITY): Payer: Self-pay

## 2023-12-20 ENCOUNTER — Ambulatory Visit: Payer: MEDICAID | Admitting: Psychiatry

## 2024-01-22 ENCOUNTER — Ambulatory Visit: Payer: MEDICAID | Admitting: Nurse Practitioner

## 2024-01-22 NOTE — Progress Notes (Deleted)
 "  There were no vitals taken for this visit.   Subjective:    Patient ID: Joe Haley, male    DOB: 02-25-1990, 33 y.o.   MRN: [id number]  HPI: Joe Haley is a 33 y.o. male  No chief complaint on file.  WEIGHT GAIN Patient is on Wegovy  0.5mg  weekly.  He has lost 1lbs since last visit.  He is ready to go to 1mg  on the medication.   Previous attempts at weight loss: yes- diet and exercise Complications of obesity: depression Peak weight: 430 lb Weight loss goal: 50lbs Weight loss to date: end of the year Requesting obesity pharmacotherapy: yes Current weight loss supplements/medications: yes- topomax Previous weight loss supplements/meds: yes Calories:     Relevant past medical, surgical, family and social history reviewed and updated as indicated. Interim medical history since our last visit reviewed. Allergies and medications reviewed and updated.  Review of Systems  Constitutional:  Negative for unexpected weight change.  Musculoskeletal:        Bilateral knee pain    Per HPI unless specifically indicated above     Objective:    There were no vitals taken for this visit.  Wt Readings from Last 3 Encounters:  10/19/23 (!) 415 lb 3.2 oz (188.3 kg)  09/18/23 (!) 418 lb 9.6 oz (189.9 kg)  07/19/23 (!) 416 lb (188.7 kg)    Physical Exam Vitals and nursing note reviewed.  Constitutional:      General: He is not in acute distress.    Appearance: Normal appearance. He is obese. He is not ill-appearing, toxic-appearing or diaphoretic.  HENT:     Head: Normocephalic.     Right Ear: External ear normal.     Left Ear: External ear normal.     Nose: Nose normal. No congestion or rhinorrhea.     Mouth/Throat:     Mouth: Mucous membranes are moist.  Eyes:     General:        Right eye: No discharge.        Left eye: No discharge.     Extraocular Movements: Extraocular movements intact.     Conjunctiva/sclera: Conjunctivae normal.     Pupils: Pupils are  equal, round, and reactive to light.  Cardiovascular:     Rate and Rhythm: Normal rate and regular rhythm.     Heart sounds: No murmur heard. Pulmonary:     Effort: Pulmonary effort is normal. No respiratory distress.     Breath sounds: Normal breath sounds. No wheezing, rhonchi or rales.  Abdominal:     General: Abdomen is flat. Bowel sounds are normal.  Musculoskeletal:     Cervical back: Normal range of motion and neck supple.  Skin:    General: Skin is warm and dry.     Capillary Refill: Capillary refill takes less than 2 seconds.  Neurological:     General: No focal deficit present.     Mental Status: He is alert and oriented to person, place, and time.  Psychiatric:        Mood and Affect: Mood normal.        Behavior: Behavior normal.        Thought Content: Thought content normal.        Judgment: Judgment normal.     Results for orders placed or performed in visit on 01/16/23  CBC w/Diff   Collection Time: 01/16/23 11:39 AM  Result Value Ref Range   WBC 11.1 (H) 3.4 - 10.8 x10E3/uL  RBC 5.45 4.14 - 5.80 x10E6/uL   Hemoglobin 16.0 13.0 - 17.7 g/dL   Hematocrit 52.0 62.4 - 51.0 %   MCV 88 79 - 97 fL   MCH 29.4 26.6 - 33.0 pg   MCHC 33.4 31.5 - 35.7 g/dL   RDW 86.2 88.3 - 84.5 %   Platelets 333 150 - 450 x10E3/uL   Neutrophils 64 Not Estab. %   Lymphs 24 Not Estab. %   Monocytes 7 Not Estab. %   Eos 3 Not Estab. %   Basos 1 Not Estab. %   Neutrophils Absolute 7.1 (H) 1.4 - 7.0 x10E3/uL   Lymphocytes Absolute 2.6 0.7 - 3.1 x10E3/uL   Monocytes Absolute 0.8 0.1 - 0.9 x10E3/uL   EOS (ABSOLUTE) 0.4 0.0 - 0.4 x10E3/uL   Basophils Absolute 0.1 0.0 - 0.2 x10E3/uL   Immature Granulocytes 1 Not Estab. %   Immature Grans (Abs) 0.1 0.0 - 0.1 x10E3/uL  Creatinine   Collection Time: 01/16/23 11:39 AM  Result Value Ref Range   Creatinine, Ser 0.84 0.76 - 1.27 mg/dL   eGFR 880 >40 fO/fpw/8.26  Hepatic function panel   Collection Time: 01/16/23 11:39 AM  Result Value  Ref Range   Total Protein 7.1 6.0 - 8.5 g/dL   Albumin 4.7 4.1 - 5.1 g/dL   Bilirubin Total 0.7 0.0 - 1.2 mg/dL   Bilirubin, Direct 9.79 0.00 - 0.40 mg/dL   Alkaline Phosphatase 83 44 - 121 IU/L   AST 21 0 - 40 IU/L   ALT 26 0 - 44 IU/L  HgB A1c   Collection Time: 01/16/23 11:39 AM  Result Value Ref Range   Hgb A1c MFr Bld 5.5 4.8 - 5.6 %   Est. average glucose Bld gHb Est-mCnc 111 mg/dL  Prolactin   Collection Time: 01/16/23 11:39 AM  Result Value Ref Range   Prolactin 14.7 3.9 - 22.7 ng/mL  Lipid Profile   Collection Time: 01/16/23 11:39 AM  Result Value Ref Range   Cholesterol, Total 144 100 - 199 mg/dL   Triglycerides 850 0 - 149 mg/dL   HDL 41 >60 mg/dL   VLDL Cholesterol Cal 26 5 - 40 mg/dL   LDL Chol Calc (NIH) 77 0 - 99 mg/dL   Chol/HDL Ratio 3.5 0.0 - 5.0 ratio      Assessment & Plan:   Problem List Items Addressed This Visit   None       Follow up plan: No follow-ups on file.      "
